# Patient Record
Sex: Female | Born: 1985 | Race: Black or African American | Hispanic: No | Marital: Married | Smoking: Never smoker
Health system: Southern US, Community
[De-identification: ages and names within clinical notes are randomized; demographics above are authoritative.]

## PROBLEM LIST (undated history)

## (undated) DIAGNOSIS — D649 Anemia, unspecified: Secondary | ICD-10-CM

## (undated) DIAGNOSIS — Z9289 Personal history of other medical treatment: Secondary | ICD-10-CM

## (undated) DIAGNOSIS — E669 Obesity, unspecified: Secondary | ICD-10-CM

## (undated) DIAGNOSIS — F329 Major depressive disorder, single episode, unspecified: Secondary | ICD-10-CM

## (undated) DIAGNOSIS — F32A Depression, unspecified: Secondary | ICD-10-CM

## (undated) DIAGNOSIS — I10 Essential (primary) hypertension: Secondary | ICD-10-CM

## (undated) DIAGNOSIS — R42 Dizziness and giddiness: Secondary | ICD-10-CM

## (undated) DIAGNOSIS — F419 Anxiety disorder, unspecified: Secondary | ICD-10-CM

## (undated) HISTORY — PX: TUBAL LIGATION: SHX77

## (undated) HISTORY — PX: NOVASURE ABLATION: SHX5394

---

## 2011-11-18 DIAGNOSIS — Z9289 Personal history of other medical treatment: Secondary | ICD-10-CM

## 2011-11-18 HISTORY — DX: Personal history of other medical treatment: Z92.89

## 2016-07-18 ENCOUNTER — Emergency Department (HOSPITAL_COMMUNITY)
Admission: EM | Admit: 2016-07-18 | Discharge: 2016-07-18 | Disposition: A | Discharge status: Home / Self Care | Payer: Self-pay | Attending: Emergency Medicine | Admitting: Emergency Medicine

## 2016-07-18 ENCOUNTER — Emergency Department (HOSPITAL_COMMUNITY): Payer: Self-pay

## 2016-07-18 ENCOUNTER — Encounter (HOSPITAL_COMMUNITY): Payer: Self-pay | Admitting: Vascular Surgery

## 2016-07-18 DIAGNOSIS — R0789 Other chest pain: Secondary | ICD-10-CM | POA: Insufficient documentation

## 2016-07-18 DIAGNOSIS — I10 Essential (primary) hypertension: Secondary | ICD-10-CM | POA: Insufficient documentation

## 2016-07-18 DIAGNOSIS — F419 Anxiety disorder, unspecified: Secondary | ICD-10-CM | POA: Insufficient documentation

## 2016-07-18 DIAGNOSIS — R079 Chest pain, unspecified: Secondary | ICD-10-CM

## 2016-07-18 HISTORY — DX: Essential (primary) hypertension: I10

## 2016-07-18 HISTORY — DX: Anxiety disorder, unspecified: F41.9

## 2016-07-18 LAB — CBC
HCT: 37.5 % (ref 36.0–46.0)
HEMOGLOBIN: 11.5 g/dL — AB (ref 12.0–15.0)
MCH: 24.5 pg — AB (ref 26.0–34.0)
MCHC: 30.7 g/dL (ref 30.0–36.0)
MCV: 80 fL (ref 78.0–100.0)
PLATELETS: 211 10*3/uL (ref 150–400)
RBC: 4.69 MIL/uL (ref 3.87–5.11)
RDW: 14.8 % (ref 11.5–15.5)
WBC: 5.2 10*3/uL (ref 4.0–10.5)

## 2016-07-18 LAB — BASIC METABOLIC PANEL
ANION GAP: 7 (ref 5–15)
BUN: 11 mg/dL (ref 6–20)
CALCIUM: 9.1 mg/dL (ref 8.9–10.3)
CO2: 24 mmol/L (ref 22–32)
CREATININE: 0.8 mg/dL (ref 0.44–1.00)
Chloride: 107 mmol/L (ref 101–111)
GFR calc Af Amer: >60 mL/min (ref >60)
GLUCOSE: 118 mg/dL — AB (ref 65–99)
Potassium: 3.9 mmol/L (ref 3.5–5.1)
Sodium: 138 mmol/L (ref 135–145)

## 2016-07-18 LAB — I-STAT TROPONIN, ED: TROPONIN I, POC: 0 ng/mL (ref 0.00–0.08)

## 2016-07-18 NOTE — ED Provider Notes (Signed)
[facility]-EMERGENCY DEPT Provider Note   CSN: [id number] Arrival date & time: 07/18/16  1649     History   Chief Complaint Chief Complaint  Patient presents with  . Chest Pain    HPI Diamond Cook is a 30 y.o. female.  The history is provided by the patient.  Chest Pain   This is a recurrent problem. The current episode started 6 to 12 hours ago. Episode frequency: intermittent. The problem has been resolved. Associated with: anxiety. Pain location: right upper chest. The pain is moderate. The quality of the pain is described as sharp. The pain does not radiate. Duration of episode(s) is 30 seconds. Pertinent negatives include no abdominal pain, no back pain, no cough, no fever, no headaches, no nausea, no palpitations, no shortness of breath and no vomiting.  Her past medical history is significant for hypertension.  Pertinent negatives for past medical history include no CAD, no COPD, no CHF, no diabetes, no DVT, no hyperlipidemia, no MI and no PE.  Her family medical history is significant for diabetes.  Pertinent negatives for family medical history include: no CAD and no heart disease.    Past Medical History:  Diagnosis Date  . Anxiety   . Hypertension     There are no active problems to display for this patient.   Past Surgical History:  Procedure Laterality Date  . CESAREAN SECTION    . TUBAL LIGATION      OB History    No data available       Home Medications    Prior to Admission medications   Medication Sig Start Date End Date Taking? Authorizing Provider  ferrous sulfate 325 (65 FE) MG EC tablet Take 325 mg by mouth daily with breakfast.   Yes Historical Provider, MD  FLUoxetine (PROZAC) 20 MG capsule Take 20 mg by mouth daily.   Yes Historical Provider, MD  meclizine (ANTIVERT) 25 MG tablet Take 25 mg by mouth every 8 (eight) hours as needed for dizziness.   Yes Historical Provider, MD  metoprolol (LOPRESSOR) 50 MG tablet Take 50 mg by mouth 2 (two) times  daily.   Yes Historical Provider, MD  norgestimate-ethinyl estradiol (ORTHO-CYCLEN,SPRINTEC,PREVIFEM) 0.25-35 MG-MCG tablet Take 1 tablet by mouth daily as needed (for heavy bleeding).   Yes Historical Provider, MD    Family History No family history on file.  Social History Social History  Substance Use Topics  . Smoking status: Never Smoker  . Smokeless tobacco: Never Used  . Alcohol use No     Allergies   Review of patient's allergies indicates not on file.   Review of Systems Review of Systems  Constitutional: Negative for chills, fatigue and fever.  HENT: Negative for congestion and sore throat.   Eyes: Negative for visual disturbance.  Respiratory: Negative for cough, chest tightness and shortness of breath.   Cardiovascular: Negative for chest pain and palpitations.  Gastrointestinal: Negative for abdominal pain, blood in stool, diarrhea, nausea and vomiting.  Genitourinary: Negative for decreased urine volume and difficulty urinating.  Musculoskeletal: Negative for back pain and neck stiffness.  Skin: Negative for rash.  Neurological: Negative for light-headedness and headaches.  Psychiatric/Behavioral: Negative for confusion.  All other systems reviewed and are negative.    Physical Exam Updated Vital Signs BP 113/76   Pulse 67   Temp 98.3 F (36.8 C) (Oral)   Resp 22   SpO2 100%   Physical Exam  Constitutional: She is oriented to person, place, and time. She appears  well-developed and well-nourished. No distress.  Obese  HENT:  Head: Normocephalic and atraumatic.  Nose: Nose normal.  Eyes: Conjunctivae and EOM are normal. Pupils are equal, round, and reactive to light. Right eye exhibits no discharge. Left eye exhibits no discharge. No scleral icterus.  Neck: Normal range of motion. Neck supple.  Cardiovascular: Normal rate and regular rhythm.  Exam reveals no gallop and no friction rub.   No murmur heard. Pulmonary/Chest: Effort normal and breath  sounds normal. No stridor. No respiratory distress. She has no rales.  Abdominal: Soft. She exhibits no distension. There is no tenderness.  Musculoskeletal: She exhibits no edema or tenderness.  Neurological: She is alert and oriented to person, place, and time.  Skin: Skin is warm and dry. No rash noted. She is not diaphoretic. No erythema.  Psychiatric: She has a normal mood and affect.  Vitals reviewed.    ED Treatments / Results  Labs (all labs ordered are listed, but only abnormal results are displayed) Labs Reviewed  BASIC METABOLIC PANEL - Abnormal; Notable for the following:       Result Value   Glucose, Bld 118 (*)    All other components within normal limits  CBC - Abnormal; Notable for the following:    Hemoglobin 11.5 (*)    MCH 24.5 (*)    All other components within normal limits  I-STAT TROPOININ, ED    EKG  EKG Interpretation  Date/Time:  Friday July 18 2016 16:58:10 EDT Ventricular Rate:  78 PR Interval:  166 QRS Duration: 88 QT Interval:  382 QTC Calculation: 435 R Axis:   73 Text Interpretation:  Normal sinus rhythm Normal ECG Confirmed by [facility] MD, Breck [place] ([id number]) on 07/18/2016 6:07:23 PM       Radiology Dg Chest 2 View  Result Date: 07/18/2016 CLINICAL DATA:  Chest pain, hypertension EXAM: CHEST  2 VIEW COMPARISON:  None FINDINGS: Normal heart size, mediastinal contours, and pulmonary vascularity. Lungs clear. No pleural effusion or pneumothorax. Bones unremarkable. IMPRESSION: Normal exam. Electronically Signed   By: Ulyses Southward M.D.   On: 07/18/2016 17:14    Procedures Procedures (including critical care time)  Medications Ordered in ED Medications - No data to display   Initial Impression / Assessment and Plan / ED Course  I have reviewed the triage vital signs and the nursing notes.  Pertinent labs & imaging results that were available during my care of the patient were reviewed by me and considered in my medical decision making (see  chart for details).  Clinical Course    Atypical chest pain which is associated with anxiety. Similar to prior episodes per patient. EKG: Normal sinus rhythm, intervals, axis. No evidence of acute ischemia, arrhythmias, or blocks. Chest x-ray without evidence suggestive of pneumonia, pneumothorax, pneumomediastinum.  No abnormal contour of the mediastinum to suggest dissection. No evidence of acute injuries. Triage chest pain workup was obtained which revealed negative troponin. Low suspicion for cardiac etiology. Other labs are reassuring. Patient is PERC negative. Presentation not classic for aortic dissection or esophageal perforation.  Patient is currently asymptomatic. No other physical complaints or exam findings noted.  Patient is safe for discharge with strict return precautions. Patient to establish care and follow-up with her primary care provider.  Final Clinical Impressions(s) / ED Diagnoses   Final diagnoses:  Chest pain, unspecified chest pain type  Anxiety   Disposition: Discharge  Condition: Good  I have discussed the results, Dx and Tx plan with the patient who expressed  understanding and agree(s) with the plan. Discharge instructions discussed at great length. The patient was given strict return precautions who verbalized understanding of the instructions. No further questions at time of discharge.    Current Discharge Medication List      [facility] [address] [place] [id number] [phone] Call  For help establishing care with a care provider      Nira ConnPedro Eduardo Yaelis Scharfenberg, MD 07/18/16 [phone]

## 2016-07-18 NOTE — ED Triage Notes (Signed)
Pt reports to the ED for eval of right sided CP that began while she was laying down this am. Denies any aggravating or relieving factors. Pain is intermittent in nature. Pt reports associated symptoms of nausea. Pt denies any OTC tx for the pain. Pt has hx of anxiety but cannot tell if this feels like an anxiety attack or not. Pt also here for toothache as well.

## 2016-08-01 ENCOUNTER — Inpatient Hospital Stay (HOSPITAL_COMMUNITY)
Admission: AD | Admit: 2016-08-01 | Discharge: 2016-08-01 | Disposition: A | Discharge status: Home / Self Care | Payer: Self-pay | Source: Ambulatory Visit | Attending: Obstetrics and Gynecology | Admitting: Obstetrics and Gynecology

## 2016-08-01 ENCOUNTER — Encounter (HOSPITAL_COMMUNITY): Payer: Self-pay | Admitting: Certified Nurse Midwife

## 2016-08-01 DIAGNOSIS — N920 Excessive and frequent menstruation with regular cycle: Secondary | ICD-10-CM | POA: Insufficient documentation

## 2016-08-01 DIAGNOSIS — R109 Unspecified abdominal pain: Secondary | ICD-10-CM | POA: Insufficient documentation

## 2016-08-01 DIAGNOSIS — Z9851 Tubal ligation status: Secondary | ICD-10-CM | POA: Insufficient documentation

## 2016-08-01 DIAGNOSIS — I10 Essential (primary) hypertension: Secondary | ICD-10-CM | POA: Insufficient documentation

## 2016-08-01 DIAGNOSIS — N946 Dysmenorrhea, unspecified: Secondary | ICD-10-CM

## 2016-08-01 DIAGNOSIS — N939 Abnormal uterine and vaginal bleeding, unspecified: Secondary | ICD-10-CM

## 2016-08-01 DIAGNOSIS — R110 Nausea: Secondary | ICD-10-CM | POA: Insufficient documentation

## 2016-08-01 DIAGNOSIS — F419 Anxiety disorder, unspecified: Secondary | ICD-10-CM | POA: Insufficient documentation

## 2016-08-01 LAB — URINALYSIS, ROUTINE W REFLEX MICROSCOPIC
Bilirubin Urine: NEGATIVE
Glucose, UA: NEGATIVE mg/dL
Ketones, ur: NEGATIVE mg/dL
LEUKOCYTES UA: NEGATIVE
NITRITE: NEGATIVE
PH: 5.5 (ref 5.0–8.0)
Protein, ur: NEGATIVE mg/dL
SPECIFIC GRAVITY, URINE: 1.025 (ref 1.005–1.030)

## 2016-08-01 LAB — CBC
HCT: 36.2 % (ref 36.0–46.0)
HEMOGLOBIN: 11.7 g/dL — AB (ref 12.0–15.0)
MCH: 24.9 pg — AB (ref 26.0–34.0)
MCHC: 32.3 g/dL (ref 30.0–36.0)
MCV: 77 fL — AB (ref 78.0–100.0)
Platelets: 214 10*3/uL (ref 150–400)
RBC: 4.7 MIL/uL (ref 3.87–5.11)
RDW: 15.2 % (ref 11.5–15.5)
WBC: 5.3 10*3/uL (ref 4.0–10.5)

## 2016-08-01 LAB — URINE MICROSCOPIC-ADD ON

## 2016-08-01 MED ORDER — PROMETHAZINE HCL 25 MG PO TABS: 12.5000 mg | ORAL_TABLET | Freq: Four times a day (QID) | ORAL | 0 refills | Status: DC | PRN

## 2016-08-01 MED ORDER — NORGESTIMATE-ETH ESTRADIOL 0.25-35 MG-MCG PO TABS: 1.0000 | ORAL_TABLET | Freq: Every day | ORAL | 11 refills | Status: DC

## 2016-08-01 MED ORDER — IBUPROFEN 800 MG PO TABS: 800.0000 mg | ORAL_TABLET | Freq: Three times a day (TID) | ORAL | 1 refills | Status: DC | PRN

## 2016-08-01 NOTE — MAU Note (Cosign Needed)
History     CSN: [id number]  Arrival date and time: 08/01/16 1055   None     Chief Complaint  Patient presents with  . Nausea  . Abdominal Pain   HPI  Diamond Cook is a 30 yo female who presents today with 3-4 days of intermittent nausea. The nausea began during an episode of "IBS" where she also experienced lower abdominal cramping. After defecation the lower abdominal cramping was relieved but the nausea persisted. She reports eating makes the nausea worse but she has been able to eat and drink appropriately. She has never had nausea like this before. She has had intermittent abdominal cramping for years which she has self diagnosed as IBS. She restarted OCPs this week for prolonged and heavy menses. Has never had nausea with menses before. She denies any current sick contacts, fever, chills, vomiting, diarrhea, constipation, paint/buring with urination, vaginal discharge.   Pertinent Gynecological History: Menses: uses 2 "overnight" pads every 3 hours, menstruation lasts 1-2 months, takes OCPs as needed to stop menstruation lasting longer than a week LMP: started last week, on 2nd week of bleeding now   Past Medical History:  Diagnosis Date  . Anxiety   . Hypertension     Past Surgical History:  Procedure Laterality Date  . CESAREAN SECTION    . TUBAL LIGATION      No family history on file.  Social History  Substance Use Topics  . Smoking status: Never Smoker  . Smokeless tobacco: Never Used  . Alcohol use No    Allergies: No Known Allergies  No prescriptions prior to admission.    Review of Systems  Constitutional: Negative for chills and fever.  HENT: Positive for congestion.   Respiratory: Negative for cough and shortness of breath.   Cardiovascular: Negative for palpitations.  Gastrointestinal: Negative for constipation and diarrhea.  Neurological: Negative for dizziness and headaches.   Physical Exam   Blood pressure 114/78, pulse 99, temperature 98.4  F (36.9 C), resp. rate 18, height 5\' 7"  (1.702 m), weight (!) 142 kg (313 lb), last menstrual period 08/01/2016.  Physical Exam  Constitutional: She appears well-developed and well-nourished. No distress.  Cardiovascular: Normal rate and regular rhythm.   Respiratory: Effort normal and breath sounds normal. No respiratory distress.  GI: Soft. Bowel sounds are normal. She exhibits no distension. There is no tenderness. There is no guarding.  Skin: Skin is warm and dry. No rash noted.    MAU Course  Procedures  Results for orders placed or performed during the hospital encounter of 08/01/16 (from the past 24 hour(s))  CBC     Status: Abnormal   Collection Time: 08/01/16 12:55 PM  Result Value Ref Range   WBC 5.3 4.0 - 10.5 K/uL   RBC 4.70 3.87 - 5.11 MIL/uL   Hemoglobin 11.7 (L) 12.0 - 15.0 g/dL   HCT 40.936.2 81.136.0 - 91.446.0 %   MCV 77.0 (L) 78.0 - 100.0 fL   MCH 24.9 (L) 26.0 - 34.0 pg   MCHC 32.3 30.0 - 36.0 g/dL   RDW 78.215.2 95.611.5 - 21.315.5 %   Platelets 214 150 - 400 K/uL  Urinalysis, Routine w reflex microscopic (not [facility])     Status: Abnormal   Collection Time: 08/01/16  1:25 PM  Result Value Ref Range   Color, Urine YELLOW YELLOW   APPearance HAZY (A) CLEAR   Specific Gravity, Urine 1.025 1.005 - 1.030   pH 5.5 5.0 - 8.0   Glucose, UA NEGATIVE  NEGATIVE mg/dL   Hgb urine dipstick LARGE (A) NEGATIVE   Bilirubin Urine NEGATIVE NEGATIVE   Ketones, ur NEGATIVE NEGATIVE mg/dL   Protein, ur NEGATIVE NEGATIVE mg/dL   Nitrite NEGATIVE NEGATIVE   Leukocytes, UA NEGATIVE NEGATIVE  Urine microscopic-add on     Status: Abnormal   Collection Time: 08/01/16  1:25 PM  Result Value Ref Range   Squamous Epithelial / LPF 0-5 (A) NONE SEEN   WBC, UA 0-5 0 - 5 WBC/hpf   RBC / HPF TOO NUMEROUS TO COUNT 0 - 5 RBC/hpf   Bacteria, UA FEW (A) NONE SEEN   Urine-Other MUCOUS PRESENT      Assessment and Plan  Diamond Cook is a 30 yo female with a history of menorrhagia who presents with  intermittent nausea.   Nausea -Discharge home -PO phenergan  -follow up with a PCP  Menorrhagia -Discharge home -Sprintec continuously  -Ibuprofen 800mg  PRN -[facility]  [facility] 08/01/2016, 2:22 PM

## 2016-08-01 NOTE — MAU Note (Signed)
Pt presents to MAU with complaints nausea and lower abdominal cramping for a couple of weeks. Pt states she is on her cycle now. Has had diarrhea but has been over 24 hours ago

## 2016-08-01 NOTE — Discharge Instructions (Signed)
[place] Resource Guide °(Revised August 2014) ° ° °Chronic Pain Problems:  °• [facility]:  [phone]  °         Patients need to be referred by their primary care doctor/specialist ° °Insufficient Money for Medicine:  °·         [employer]: call "211"   °• MAP Program at [facility] [place] [phone] or [place] [phone] °          ° °No Primary Care Doctor:  °To locate a primary care doctor that accepts your insurance or provides certain services:  °·         [place] [facility]: [phone]  °·         Physician Referral Service: 1-800-533-3463 ask for “My [place]” °• If no insurance, you need to see if you qualify for GCCN “orange card”, call to set      up appointment for eligibility/enrollment at [phone] or [phone] or visit [place] [facility] ([address], [place] and [address] -[place]) to meet with a GCCN enrollment specialist. ° °Agencies that provide inexpensive (sliding fee scale) medical care:  ° °•    [facility] [place] - [phone] °•    [facility]  -  [facility] - [phone] °•    [facility] - [phone] °•    [facility] - [phone] °•    [facility] - [phone] °•    [facility] - [phone] °• [facility] [place] - [phone]     [phone] °• [facility] [place] - [phone] °• [facility]: [phone]  °• [facility]: [phone]  °• [facility]: [phone]  °• [facility] - [phone]- [phone] ° ° ° °Medicaid-accepting [place] Providers:  °·         [facility] - [phone] (No Family Planning accepted) °·         [address], [phone], Mon-Fri 9am-5pm °·          [facility] - [phone] °• [address], [address], Mon-Thursday 8am-5pm, Fri 8am-noon °• [facility] - [phone] °·         [address], [address], Mon-Fri 7:30am-4:30pm °·         [facility] - [phone] °·         [address] [place] [address], Mon-Fri 8am-5pm  °·        [facility] - [phone] °·         [address] °·         Only accepts [place] Access Medicaid patients after they have their name applied to their card ° °Self Pay (no insurance) in [place]:  °         Sickle Cell Patients:  °• [address], [phone] °[facility]: °• [address], [place] [phone] ° °     [facility] °• [address], [place] [phone] ° °[facility]: °• [address], [phone] °           [facility]  °·         [address], [phone] °[facility] °• [address], [phone] °          [facility]  °·         [address], [place] [phone] °       [facility] - [address]  °·         [phone], Mon-Fri 9am-7pm, Sat 9am-1pm °         [facility] [place] °·         [address] [place] [address], [phone] °         [facility] [place]  °·         [address], [phone] °[facility] [place] °• [address], [place] [phone] °         [facility]  °·         [address] [place] [address], [phone] ° [facility]  °• [address], [phone] °[facility] °• [address], [phone] ° Dr. Osei-Bonsu  °·         [address], [place],  [phone] °         [facility]  °·         [address], [phone] °         [facility]  °·           [address], [phone] °         [facility]  °·         [address], [phone], 1st & 3rd Saturday every month, 10am-1pm ° °OTHERS:  [facility]  ([facility] Only)  [phone] (Thursday only) ° °Strategies for finding a Primary Care Provider:  °1) Find a Doctor and Pay Out of Pocket  °Although you won't have to find out who is covered by your insurance plan, it is a good idea to ask around and get recommendations. You will then need to call the office and see if the doctor you have chosen will accept you as a new patient and what types of options they offer for patients who are self-pay. Some doctors offer discounts or will set up payment plans for their patients who do not have insurance, but you will need to ask so you aren't surprised when you get to your appointment.  °2) Contact [facility] - To see if you qualify for “orange card” access to healthcare safety net providers.  Call for appointment for eligibility/enrollment at [phone] or [phone]. (Uninsured, 0-200% FPL, qualifying info)  °Applicants for GCCN are first required to see if they are eligible to enroll in the ACA Marketplace before enrolling in GCCN (and get an exemption if they are not).  °GCCN Criteria for acceptance is:  °? Proof of ACA Marketing exemption - form or documentation  °? Valid photo ID (driver's license, state identification card, passport, home country ID)  °? Proof of [place] residency (e.g. driver’s license, lease/landlord information, pay stubs with address, utility   bill, bank statement, etc.)  ? Proof of income (1040, last year's tax return, W2, 4 current pay stubs, other income proof)  ? Proof of assets (current bank statement + 3 most recent, disability paperwork, life insurance info, tax value on autos, etc.)  3)  Contact Your Local Health Department  Not all health departments have doctors that can see patients for sick visits, but many do, so it is worth a call to see if yours does. If you don't know where your local health department is, you can check in your phone book. The CDC also has a tool to help you locate your state's health department, and many state websites also have listings of all of their local health departments.  4) Find a Walk-in Clinic  If your illness is not likely to be very severe or complicated, you may want to try a walk in clinic. These are popping up all over the country in pharmacies, drugstores, and shopping centers. They're usually staffed by nurse practitioners or physician assistants that have been trained to treat common illnesses and complaints. They're usually fairly quick and inexpensive. However, if you have serious medical issues or chronic medical problems, these are probably not your best option   Abnormal Uterine Bleeding Abnormal uterine bleeding can affect women at various stages in life, including teenagers, women in their reproductive years, pregnant women, and women who have reached menopause. Several kinds of uterine bleeding are considered abnormal, including:  Bleeding or spotting between periods.   Bleeding after sexual intercourse.   Bleeding that is heavier or more than normal.   Periods that last longer than usual.  Bleeding after menopause.  Many cases of abnormal uterine bleeding are minor and simple to treat, while others are more serious. Any type of abnormal bleeding should be evaluated by your health care provider. Treatment will depend on the cause of the bleeding. HOME CARE INSTRUCTIONS Monitor your condition for any changes. The following actions may help to alleviate any discomfort you are experiencing:  Avoid the use of tampons and douches as directed by your health care provider.  Change your pads frequently. You should get regular  pelvic exams and Pap tests. Keep all follow-up appointments for diagnostic tests as directed by your health care provider.  SEEK MEDICAL CARE IF:   Your bleeding lasts more than 1 week.   You feel dizzy at times.  SEEK IMMEDIATE MEDICAL CARE IF:   You pass out.   You are changing pads every 15 to 30 minutes.   You have abdominal pain.  You have a fever.   You become sweaty or weak.   You are passing large blood clots from the vagina.   You start to feel nauseous and vomit. MAKE SURE YOU:   Understand these instructions.  Will watch your condition.  Will get help right away if you are not doing well or get worse.   This information is not intended to replace advice given to you by your health care provider. Make sure you discuss any questions you have with your health care provider.   Document Released: 11/03/2005 Document Revised: 11/08/2013 Document Reviewed: 06/02/2013 Elsevier Interactive Patient Education 2016 Elsevier Inc.  Nausea and Vomiting Nausea is a sick feeling that often comes before throwing up (vomiting). Vomiting is a reflex where stomach contents come out of your mouth. Vomiting can cause severe loss of body fluids (dehydration). Children and elderly adults can become dehydrated quickly, especially if they also have diarrhea. Nausea and  vomiting are symptoms of a condition or disease. It is important to find the cause of your symptoms. CAUSES   Direct irritation of the stomach lining. This irritation can result from increased acid production (gastroesophageal reflux disease), infection, food poisoning, taking certain medicines (such as nonsteroidal anti-inflammatory drugs), alcohol use, or tobacco use.  Signals from the brain.These signals could be caused by a headache, heat exposure, an inner ear disturbance, increased pressure in the brain from injury, infection, a tumor, or a concussion, pain, emotional stimulus, or metabolic problems.  An  obstruction in the gastrointestinal tract (bowel obstruction).  Illnesses such as diabetes, hepatitis, gallbladder problems, appendicitis, kidney problems, cancer, sepsis, atypical symptoms of a heart attack, or eating disorders.  Medical treatments such as chemotherapy and radiation.  Receiving medicine that makes you sleep (general anesthetic) during surgery. DIAGNOSIS Your caregiver may ask for tests to be done if the problems do not improve after a few days. Tests may also be done if symptoms are severe or if the reason for the nausea and vomiting is not clear. Tests may include:  Urine tests.  Blood tests.  Stool tests.  Cultures (to look for evidence of infection).  X-rays or other imaging studies. Test results can help your caregiver make decisions about treatment or the need for additional tests. TREATMENT You need to stay well hydrated. Drink frequently but in small amounts.You may wish to drink water, sports drinks, clear broth, or eat frozen ice pops or gelatin dessert to help stay hydrated.When you eat, eating slowly may help prevent nausea.There are also some antinausea medicines that may help prevent nausea. HOME CARE INSTRUCTIONS   Take all medicine as directed by your caregiver.  If you do not have an appetite, do not force yourself to eat. However, you must continue to drink fluids.  If you have an appetite, eat a normal diet unless your caregiver tells you differently.  Eat a variety of complex carbohydrates (rice, wheat, potatoes, bread), lean meats, yogurt, fruits, and vegetables.  Avoid high-fat foods because they are more difficult to digest.  Drink enough water and fluids to keep your urine clear or pale yellow.  If you are dehydrated, ask your caregiver for specific rehydration instructions. Signs of dehydration may include:  Severe thirst.  Dry lips and mouth.  Dizziness.  Dark urine.  Decreasing urine frequency and  amount.  Confusion.  Rapid breathing or pulse. SEEK IMMEDIATE MEDICAL CARE IF:   You have blood or brown flecks (like coffee grounds) in your vomit.  You have black or bloody stools.  You have a severe headache or stiff neck.  You are confused.  You have severe abdominal pain.  You have chest pain or trouble breathing.  You do not urinate at least once every 8 hours.  You develop cold or clammy skin.  You continue to vomit for longer than 24 to 48 hours.  You have a fever. MAKE SURE YOU:   Understand these instructions.  Will watch your condition.  Will get help right away if you are not doing well or get worse.   This information is not intended to replace advice given to you by your health care provider. Make sure you discuss any questions you have with your health care provider.   Document Released: 11/03/2005 Document Revised: 01/26/2012 Document Reviewed: 04/02/2011 Elsevier Interactive Patient Education 2016 Elsevier Inc.  Dysmenorrhea Menstrual cramps (dysmenorrhea) are caused by the muscles of the uterus tightening (contracting) during a menstrual period. For some women,  this discomfort is merely bothersome. For others, dysmenorrhea can be severe enough to interfere with everyday activities for a few days each month. Primary dysmenorrhea is menstrual cramps that last a couple of days when you start having menstrual periods or soon after. This often begins after a teenager starts having her period. As a woman gets older or has a baby, the cramps will usually lessen or disappear. Secondary dysmenorrhea begins later in life, lasts longer, and the pain may be stronger than primary dysmenorrhea. The pain may start before the period and last a few days after the period.  CAUSES  Dysmenorrhea is usually caused by an underlying problem, such as:  The tissue lining the uterus grows outside of the uterus in other areas of the body (endometriosis).  The endometrial  tissue, which normally lines the uterus, is found in or grows into the muscular walls of the uterus (adenomyosis).  The pelvic blood vessels are engorged with blood just before the menstrual period (pelvic congestive syndrome).  Overgrowth of cells (polyps) in the lining of the uterus or cervix.  Falling down of the uterus (prolapse) because of loose or stretched ligaments.  Depression.  Bladder problems, infection, or inflammation.  Problems with the intestine, a tumor, or irritable bowel syndrome.  Cancer of the female organs or bladder.  A severely tipped uterus.  A very tight opening or closed cervix.  Noncancerous tumors of the uterus (fibroids).  Pelvic inflammatory disease (PID).  Pelvic scarring (adhesions) from a previous surgery.  Ovarian cyst.  An intrauterine device (IUD) used for birth control. RISK FACTORS You may be at greater risk of dysmenorrhea if:  You are younger than age 31.  You started puberty early.  You have irregular or heavy bleeding.  You have never given birth.  You have a family history of this problem.  You are a smoker. SIGNS AND SYMPTOMS   Cramping or throbbing pain in your lower abdomen.  Headaches.  Lower back pain.  Nausea or vomiting.  Diarrhea.  Sweating or dizziness.  Loose stools. DIAGNOSIS  A diagnosis is based on your history, symptoms, physical exam, diagnostic tests, or procedures. Diagnostic tests or procedures may include:  Blood tests.  Ultrasonography.  An examination of the lining of the uterus (dilation and curettage, D&C).  An examination inside your abdomen or pelvis with a scope (laparoscopy).  X-rays.  CT scan.  MRI.  An examination inside the bladder with a scope (cystoscopy).  An examination inside the intestine or stomach with a scope (colonoscopy, gastroscopy). TREATMENT  Treatment depends on the cause of the dysmenorrhea. Treatment may include:  Pain medicine prescribed by your  health care provider.  Birth control pills or an IUD with progesterone hormone in it.  Hormone replacement therapy.  Nonsteroidal anti-inflammatory drugs (NSAIDs). These may help stop the production of prostaglandins.  Surgery to remove adhesions, endometriosis, ovarian cyst, or fibroids.  Removal of the uterus (hysterectomy).  Progesterone shots to stop the menstrual period.  Cutting the nerves on the sacrum that go to the female organs (presacral neurectomy).  Electric current to the sacral nerves (sacral nerve stimulation).  Antidepressant medicine.  Psychiatric therapy, counseling, or group therapy.  Exercise and physical therapy.  Meditation and yoga therapy.  Acupuncture. HOME CARE INSTRUCTIONS   Only take over-the-counter or prescription medicines as directed by your health care provider.  Place a heating pad or hot water bottle on your lower back or abdomen. Do not sleep with the heating pad.  Use aerobic  exercises, walking, swimming, biking, and other exercises to help lessen the cramping.  Massage to the lower back or abdomen may help.  Stop smoking.  Avoid alcohol and caffeine. SEEK MEDICAL CARE IF:   Your pain does not get better with medicine.  You have pain with sexual intercourse.  Your pain increases and is not controlled with medicines.  You have abnormal vaginal bleeding with your period.  You develop nausea or vomiting with your period that is not controlled with medicine. SEEK IMMEDIATE MEDICAL CARE IF:  You pass out.    This information is not intended to replace advice given to you by your health care provider. Make sure you discuss any questions you have with your health care provider.   Document Released: 11/03/2005 Document Revised: 07/06/2013 Document Reviewed: 04/21/2013 Elsevier Interactive Patient Education [employer]

## 2016-08-01 NOTE — MAU Provider Note (Signed)
Chief Complaint: Nausea and Abdominal Pain   First Provider Initiated Contact with Patient 08/01/16 1306      SUBJECTIVE HPI: Diamond Cook is a 30 y.o.  who presents to maternity admissions reporting nausea x 3 days without vomiting and abdominal pain.  She reports acute onset of nausea 3 days ago.  Eating makes it worse but she has tolerated food/fluids well.  She reports 1 episode of diarrhea yesterday but none since.  She believes she has IBS but has never seen anyone for this.  She denies abdominal pain today but reports she has pain associated with her heavy periods.  She recently moved to [facility] from [facility] and last saw a Gyn provider a few months ago for heavy periods and abdominal cramping.  She was prescribed OCPs to take when bleeding lasts >10 days and ibuprofen 800 mg.  She reports she has no pain when on OCPs but when she stops them, the bleeding and pain start.  She started her period 2 weeks ago and started the OCPs 3-4 days ago so her pain stopped.  But, she is concerned that the pain will return when she stops the OCPs.  She reports light vaginal bleeding/spotting today, as the bleeding has improved on OCPs. She vaginal itching/burning, urinary symptoms, h/a, dizziness, n/v, or fever/chills.     HPI  Past Medical History:  Diagnosis Date  . Anxiety   . Hypertension    Past Surgical History:  Procedure Laterality Date  . CESAREAN SECTION    . TUBAL LIGATION     Social History   Social History  . Marital status: Married    Spouse name: N/A  . Number of children: N/A  . Years of education: N/A   Occupational History  . Not on file.   Social History Main Topics  . Smoking status: Never Smoker  . Smokeless tobacco: Never Used  . Alcohol use No  . Drug use: No  . Sexual activity: Not on file   Other Topics Concern  . Not on file   Social History Narrative  . No narrative on file   No current facility-administered medications on file prior to encounter.     Current Outpatient Prescriptions on File Prior to Encounter  Medication Sig Dispense Refill  . ferrous sulfate 325 (65 FE) MG EC tablet Take 325 mg by mouth daily as needed (when feeling sluggish).     . metoprolol (LOPRESSOR) 50 MG tablet Take 50 mg by mouth 2 (two) times daily.    . norgestimate-ethinyl estradiol (ORTHO-CYCLEN,SPRINTEC,PREVIFEM) 0.25-35 MG-MCG tablet Take 1 tablet by mouth daily as needed (for heavy bleeding).    . meclizine (ANTIVERT) 25 MG tablet Take 25 mg by mouth every 8 (eight) hours as needed for dizziness.     No Known Allergies  ROS:  Review of Systems  Constitutional: Negative for chills, fatigue and fever.  Respiratory: Negative for shortness of breath.   Cardiovascular: Negative for chest pain.  Gastrointestinal: Positive for abdominal pain, diarrhea and nausea. Negative for vomiting.  Genitourinary: Positive for vaginal bleeding. Negative for difficulty urinating, dysuria, flank pain, pelvic pain, vaginal discharge and vaginal pain.  Neurological: Negative for dizziness and headaches.  Psychiatric/Behavioral: Negative.      I have reviewed patient's Past Medical Hx, Surgical Hx, Family Hx, Social Hx, medications and allergies.   Physical Exam  Patient Vitals for the past 24 hrs:  BP Temp Pulse Resp Weight  08/01/16 1117 - - - - (!) 313 lb (142  kg)  08/01/16 1116 114/78 98.4 F (36.9 C) 99 18 -   Constitutional: Well-developed, well-nourished female in no acute distress.  Cardiovascular: normal rate Respiratory: normal effort GI: Abd soft, non-tender. Pos BS x 4 MS: Extremities nontender, no edema, normal ROM Neurologic: Alert and oriented x 4.  GU: Neg CVAT.  PELVIC EXAM: Deferred   LAB RESULTS Results for orders placed or performed during the hospital encounter of 08/01/16 (from the past 24 hour(s))  CBC     Status: Abnormal   Collection Time: 08/01/16 12:55 PM  Result Value Ref Range   WBC 5.3 4.0 - 10.5 K/uL   RBC 4.70 3.87 - 5.11  MIL/uL   Hemoglobin 11.7 (L) 12.0 - 15.0 g/dL   HCT 16.136.2 09.636.0 - 04.546.0 %   MCV 77.0 (L) 78.0 - 100.0 fL   MCH 24.9 (L) 26.0 - 34.0 pg   MCHC 32.3 30.0 - 36.0 g/dL   RDW 40.915.2 81.111.5 - 91.415.5 %   Platelets 214 150 - 400 K/uL       IMAGING  MAU Management/MDM: Ordered labs and reviewed results.  No evidence of infection or anemia.  Recommend continuous OCPs, reviewed pt hx and only risk factor is obesity.  Sprintec Rx sent to pharmacy.  Rx for ibuprofen 800 mg PO Q 8 hours.  Phenergan 12.5-25 mg PO Q 6 hours for nausea.  F/U in Gyn office, pt given contact information.  F/U with primary care if GI symtoms persist.  Pt stable at time of discharge.  ASSESSMENT 1. Nausea without vomiting   2. Dysmenorrhea   3. Abnormal uterine bleeding (AUB)     PLAN Discharge home   Medication List    TAKE these medications   ferrous sulfate 325 (65 FE) MG EC tablet Take 325 mg by mouth daily as needed (when feeling sluggish).   ibuprofen 800 MG tablet Commonly known as:  ADVIL,MOTRIN Take 1 tablet (800 mg total) by mouth every 8 (eight) hours as needed.   meclizine 25 MG tablet Commonly known as:  ANTIVERT Take 25 mg by mouth every 8 (eight) hours as needed for dizziness.   metoprolol 50 MG tablet Commonly known as:  LOPRESSOR Take 50 mg by mouth 2 (two) times daily.   norgestimate-ethinyl estradiol 0.25-35 MG-MCG tablet Commonly known as:  ORTHO-CYCLEN,SPRINTEC,PREVIFEM Take 1 tablet by mouth daily. What changed:  when to take this  reasons to take this   promethazine 25 MG tablet Commonly known as:  PHENERGAN Take 0.5-1 tablets (12.5-25 mg total) by mouth every 6 (six) hours as needed for nausea.        Sharen CounterLisa Leftwich-Kirby Certified Nurse-Midwife 08/01/2016  1:13 PM

## 2016-08-04 ENCOUNTER — Encounter (HOSPITAL_COMMUNITY): Payer: Self-pay | Admitting: Vascular Surgery

## 2016-08-04 ENCOUNTER — Emergency Department (HOSPITAL_COMMUNITY)
Admission: EM | Admit: 2016-08-04 | Discharge: 2016-08-04 | Disposition: A | Discharge status: Home / Self Care | Payer: Self-pay | Attending: Dermatology | Admitting: Dermatology

## 2016-08-04 DIAGNOSIS — R51 Headache: Secondary | ICD-10-CM | POA: Insufficient documentation

## 2016-08-04 DIAGNOSIS — I10 Essential (primary) hypertension: Secondary | ICD-10-CM | POA: Insufficient documentation

## 2016-08-04 DIAGNOSIS — Z5321 Procedure and treatment not carried out due to patient leaving prior to being seen by health care provider: Secondary | ICD-10-CM | POA: Insufficient documentation

## 2016-08-04 NOTE — ED Triage Notes (Signed)
Pt reports to the ED for eval of HA since last pm (estimates 2 am onset). She describes the pain as a sharpness. Denies any aggravating or relieving factors. Denies any weakness, trouble walking, talking, swallowing, or vision changes. On triage assessment no facial droop or arm drift. Grip equal also reports a toothache to the left upper jaw.

## 2016-08-04 NOTE — ED Notes (Signed)
Pt called x 3 for reassess and unable to locate

## 2016-08-20 ENCOUNTER — Encounter (HOSPITAL_COMMUNITY): Payer: Self-pay | Admitting: *Deleted

## 2016-08-20 ENCOUNTER — Emergency Department (HOSPITAL_COMMUNITY)
Admission: EM | Admit: 2016-08-20 | Discharge: 2016-08-20 | Disposition: A | Discharge status: Home / Self Care | Payer: Medicaid Other | Attending: Emergency Medicine | Admitting: Emergency Medicine

## 2016-08-20 ENCOUNTER — Emergency Department (HOSPITAL_COMMUNITY): Payer: Medicaid Other

## 2016-08-20 DIAGNOSIS — N938 Other specified abnormal uterine and vaginal bleeding: Secondary | ICD-10-CM

## 2016-08-20 DIAGNOSIS — R1030 Lower abdominal pain, unspecified: Secondary | ICD-10-CM

## 2016-08-20 DIAGNOSIS — I10 Essential (primary) hypertension: Secondary | ICD-10-CM | POA: Insufficient documentation

## 2016-08-20 DIAGNOSIS — R102 Pelvic and perineal pain: Secondary | ICD-10-CM

## 2016-08-20 DIAGNOSIS — R52 Pain, unspecified: Secondary | ICD-10-CM

## 2016-08-20 LAB — URINALYSIS, ROUTINE W REFLEX MICROSCOPIC
Bilirubin Urine: NEGATIVE
Glucose, UA: NEGATIVE mg/dL
Ketones, ur: NEGATIVE mg/dL
Leukocytes, UA: NEGATIVE
Nitrite: NEGATIVE
Protein, ur: NEGATIVE mg/dL
Specific Gravity, Urine: 1.029 (ref 1.005–1.030)
pH: 5.5 (ref 5.0–8.0)

## 2016-08-20 LAB — PREGNANCY, URINE: Preg Test, Ur: NEGATIVE

## 2016-08-20 LAB — URINE MICROSCOPIC-ADD ON

## 2016-08-20 MED ORDER — KETOROLAC TROMETHAMINE 30 MG/ML IJ SOLN
30.0000 mg | Freq: Once | INTRAMUSCULAR | Status: AC
  Administered 2016-08-20: 30 mg via INTRAMUSCULAR
  Filled 2016-08-20: qty 1

## 2016-08-20 MED ORDER — LORAZEPAM 1 MG PO TABS
0.5000 mg | ORAL_TABLET | Freq: Once | ORAL | Status: DC
  Filled 2016-08-20: qty 1

## 2016-08-20 MED ORDER — OXYCODONE-ACETAMINOPHEN 5-325 MG PO TABS
1.0000 | ORAL_TABLET | Freq: Once | ORAL | Status: AC
  Administered 2016-08-20: 1 via ORAL
  Filled 2016-08-20: qty 1

## 2016-08-20 NOTE — ED Notes (Signed)
Patient transported to Ultrasound 

## 2016-08-20 NOTE — ED Notes (Signed)
Pt refused d/c vitals.

## 2016-08-20 NOTE — ED Triage Notes (Signed)
Pt reports lower abd pain x 2 weeks and has been on menstrual x 2 months. Pt reports this being uterine pain. Denies urinary symptoms.

## 2016-08-20 NOTE — ED Notes (Signed)
Pt returned from ultrasound

## 2016-08-20 NOTE — ED Provider Notes (Signed)
[facility]-EMER[facility]Y DEPT Provider Note   CSN: [id number] Arrival date & time: 08/20/16  4098  By signing my name below, I, Freida Busman, attest that this documentation has been prepared under the direction and in the presence of Raeford Razor, MD . Electronically Signed: Freida Busman, Scribe. 08/20/2016. 9:23 AM.   History   Chief Complaint Chief Complaint  Patient presents with  . Abdominal Pain    The history is provided by the patient. No language interpreter was used.     HPI Comments:  Diamond Cook is a 30 y.o. female who presents to the Emergency Department complaining of constant suprapubic/pelvic pain x 1.5 months. She notes she was taking Ibuprofen which recently stopped providing relief. She describes her pain as a cramping sensation and states "my uterus hurts".  She has a h/o similar pain but has been unable to follow up with GYN due to insurance issues. Pt reports associated vaginal bleeding x 2 months; she has been using ~ 6 pads per day. She also notes nausea. Pt is compliant with her oral contraceptive pills but states it has not provided relief of her bleeding. She denies fever, chills, abnormal vaginal discharge, vomiting, and bowel/bladder changes. Pt is sexually active and has a h/o tubal ligation.  Past Medical History:  Diagnosis Date  . Anxiety   . Hypertension     There are no active problems to display for this patient.   Past Surgical History:  Procedure Laterality Date  . CESAREAN SECTION    . TUBAL LIGATION      OB History    Gravida Para Term Preterm AB Living   3 3 3     3    SAB TAB Ectopic Multiple Live Births           3       Home Medications    Prior to Admission medications   Medication Sig Start Date End Date Taking? Authorizing Provider  ferrous sulfate 325 (65 FE) MG EC tablet Take 325 mg by mouth daily as needed (when feeling sluggish).    Yes Historical Provider, MD  ibuprofen (ADVIL,MOTRIN) 800 MG tablet Take 1 tablet (800 mg  total) by mouth every 8 (eight) hours as needed. Patient taking differently: Take 800 mg by mouth every 8 (eight) hours as needed for headache or moderate pain.  08/01/16  Yes Lisa A Leftwich-Kirby, CNM  metoprolol (LOPRESSOR) 50 MG tablet Take 50 mg by mouth 2 (two) times daily.   Yes Historical Provider, MD  norgestimate-ethinyl estradiol (ORTHO-CYCLEN,SPRINTEC,PREVIFEM) 0.25-35 MG-MCG tablet Take 1 tablet by mouth daily. 08/01/16  Yes Lisa A Leftwich-Kirby, CNM  meclizine (ANTIVERT) 25 MG tablet Take 25 mg by mouth every 8 (eight) hours as needed for dizziness.    Historical Provider, MD  promethazine (PHENERGAN) 25 MG tablet Take 0.5-1 tablets (12.5-25 mg total) by mouth every 6 (six) hours as needed for nausea. 08/01/16   Hurshel Party, CNM    Family History History reviewed. No pertinent family history.  Social History Social History  Substance Use Topics  . Smoking status: Never Smoker  . Smokeless tobacco: Never Used  . Alcohol use No     Allergies   Review of patient's allergies indicates no known allergies.   Review of Systems Review of Systems  Constitutional: Negative for chills and fever.  Respiratory: Negative for shortness of breath.   Cardiovascular: Negative for chest pain.  Gastrointestinal: Positive for abdominal pain and nausea. Negative for constipation, diarrhea and vomiting.  Genitourinary:  Positive for vaginal bleeding. Negative for dysuria, frequency, urgency and vaginal discharge.  All other systems reviewed and are negative.  Physical Exam Updated Vital Signs BP 115/65   Pulse 70   Temp 97.9 F (36.6 C) (Oral)   Resp 18   Ht 5\' 7"  (1.702 m)   Wt (!) 305 lb (138.3 kg)   LMP 08/01/2016   SpO2 100%   BMI 47.77 kg/m   Physical Exam  Constitutional: She is oriented to person, place, and time. She appears well-developed and well-nourished. No distress.  HENT:  Head: Normocephalic and atraumatic.  Eyes: EOM are normal.  Neck: Normal range  of motion.  Cardiovascular: Normal rate, regular rhythm and normal heart sounds.   Pulmonary/Chest: Effort normal and breath sounds normal.  Abdominal: Soft. She exhibits no distension. There is tenderness.  Suprapubic  Musculoskeletal: Normal range of motion.  Neurological: She is alert and oriented to person, place, and time.  Skin: Skin is warm and dry.  Psychiatric: She has a normal mood and affect. Judgment normal.  Nursing note and vitals reviewed.  ED Treatments / Results  DIAGNOSTIC STUDIES:  Oxygen Saturation is 100% on RA, normal by my interpretation.    COORDINATION OF CARE:  9:09 AM Discussed treatment plan with pt at bedside and pt agreed to plan.  Labs (all labs ordered are listed, but only abnormal results are displayed) Labs Reviewed  URINE CULTURE - Abnormal; Notable for the following:       Result Value   Culture MULTIPLE SPECIES PRESENT, SUGGEST RECOLLECTION (*)    All other components within normal limits  URINALYSIS, ROUTINE W REFLEX MICROSCOPIC (NOT AT [facility]) - Abnormal; Notable for the following:    APPearance HAZY (*)    Hgb urine dipstick LARGE (*)    All other components within normal limits  URINE MICROSCOPIC-ADD ON - Abnormal; Notable for the following:    Squamous Epithelial / LPF 0-5 (*)    Bacteria, UA FEW (*)    All other components within normal limits  PREGNANCY, URINE    EKG  EKG Interpretation None       Radiology No results found.  [place] Pelvis Complete  Result Date: 08/20/2016 CLINICAL DATA:  Six week history of pelvic pain EXAM: TRANSABDOMINAL ULTRASOUND OF PELVIS TECHNIQUE: Transabdominal ultrasound examination of the pelvis was performed in longitudinal and transverse projections. Patient refused transvaginal study. COMPARISON:  None. FINDINGS: Uterus Measurements: 8.4 x 6.5 x 6.5 cm. No fibroids or other mass visualized. Endometrium Thickness: 4 mm.  No focal abnormality visualized. Right ovary Unable to visualize by  transabdominal technique. No pelvic mass on the right evident on this study. Left ovary Unable to visualize by transabdominal technique. No pelvic mass on the left evident on this study. Other findings:  No abnormal free fluid. IMPRESSION: Unable to visualize either ovary by transabdominal technique. Patient refused transvaginal study. No pelvic mass or inflammatory focus is evident on this study. No free pelvic fluid. Electronically Signed   By: Bretta BangWilliam  Woodruff III M.D.   On: 08/20/2016 11:26    Procedures Procedures (including critical care time)  Medications Ordered in ED Medications  ketorolac (TORADOL) 30 MG/ML injection 30 mg (30 mg Intramuscular Given 08/20/16 0942)  oxyCODONE-acetaminophen (PERCOCET/ROXICET) 5-325 MG per tablet 1 tablet (1 tablet Oral Given 08/20/16 0942)     Initial Impression / Assessment and Plan / ED Course  I have reviewed the triage vital signs and the nursing notes.  Pertinent labs & imaging results that were  available during my care of the patient were reviewed by me and considered in my medical decision making (see chart for details).  Clinical Course    30yF with pelvic pain. She is refusing both pelvic exams and transvaginal US. This limits her evaluation and this was discussed with her. She is generally unhappy/frustrated with her symptoms. Understandable, but I doubt emergent process. It has been determined that no acute conditions requiring further emergency intervention are present at this time. The patient has been advised of the diagnosis and plan. I reviewed any labs and imaging including any potential incidental findings. We have discussed signs and symptoms that warrant return to the ED and they are listed in the discharge instructions.    Final Clinical Impressions(s) / ED Diagnoses   Final diagnoses:  Pain  DUB (dysfunctional uterine bleeding)  Pelvic pain in female    New Prescriptions New Prescriptions   No medications on file   I  personally preformed the services scribed in my presence. The recorded information has been reviewed is accurate. Raeford Razor, MD.     Raeford Razor, MD 08/24/16 1058

## 2016-08-21 LAB — URINE CULTURE

## 2016-09-06 ENCOUNTER — Encounter (HOSPITAL_COMMUNITY): Payer: Self-pay | Admitting: *Deleted

## 2016-09-06 ENCOUNTER — Emergency Department (HOSPITAL_COMMUNITY)
Admission: EM | Admit: 2016-09-06 | Discharge: 2016-09-06 | Disposition: A | Discharge status: Home / Self Care | Payer: Medicaid Other | Attending: Emergency Medicine | Admitting: Emergency Medicine

## 2016-09-06 DIAGNOSIS — R0981 Nasal congestion: Secondary | ICD-10-CM | POA: Diagnosis present

## 2016-09-06 DIAGNOSIS — I10 Essential (primary) hypertension: Secondary | ICD-10-CM | POA: Diagnosis not present

## 2016-09-06 DIAGNOSIS — Z79899 Other long term (current) drug therapy: Secondary | ICD-10-CM | POA: Diagnosis not present

## 2016-09-06 DIAGNOSIS — J029 Acute pharyngitis, unspecified: Secondary | ICD-10-CM | POA: Insufficient documentation

## 2016-09-06 HISTORY — DX: Dizziness and giddiness: R42

## 2016-09-06 HISTORY — DX: Obesity, unspecified: E66.9

## 2016-09-06 HISTORY — DX: Anemia, unspecified: D64.9

## 2016-09-06 LAB — RAPID STREP SCREEN (MED CTR MEBANE ONLY): Streptococcus, Group A Screen (Direct): NEGATIVE

## 2016-09-06 MED ORDER — CETIRIZINE HCL 10 MG PO TABS: 10.0000 mg | ORAL_TABLET | Freq: Every day | ORAL | 1 refills | Status: DC

## 2016-09-06 MED ORDER — AMOXICILLIN-POT CLAVULANATE 875-125 MG PO TABS: 1.0000 | ORAL_TABLET | Freq: Two times a day (BID) | ORAL | 0 refills | Status: DC

## 2016-09-06 NOTE — ED Notes (Signed)
Declined W/C at D/C and was escorted to lobby by RN. 

## 2016-09-06 NOTE — ED Provider Notes (Signed)
[facility] Provider Note   CSN: [id number] Arrival date & time: 09/06/16  1431     History   Chief Complaint Chief Complaint  Patient presents with  . Sore Throat  . Headache    HPI Diamond Cook is a 30 y.o. female.  Patient presents to the ED with a chief complaint of cold symptoms.  She states that she has had severe sinus congestion causing sinus headache, sore throat with swollen tonsils, and mild cough for the past 3 days.  There are no other associated symptoms.  There are no modifying factors.  She has not taken anything for her symptoms.    The history is provided by the patient. No language interpreter was used.    Past Medical History:  Diagnosis Date  . Anemia   . Anxiety   . Hypertension   . Obesity   . Vertigo     There are no active problems to display for this patient.   Past Surgical History:  Procedure Laterality Date  . CESAREAN SECTION    . TUBAL LIGATION      OB History    Gravida Para Term Preterm AB Living   3 3 3     3    SAB TAB Ectopic Multiple Live Births           3       Home Medications    Prior to Admission medications   Medication Sig Start Date End Date Taking? Authorizing Provider  amoxicillin-clavulanate (AUGMENTIN) 875-125 MG tablet Take 1 tablet by mouth every 12 (twelve) hours. 09/06/16   Roxy Horseman, PA-C  cetirizine (ZYRTEC ALLERGY) 10 MG tablet Take 1 tablet (10 mg total) by mouth daily. 09/06/16   Roxy Horseman, PA-C  ferrous sulfate 325 (65 FE) MG EC tablet Take 325 mg by mouth daily as needed (when feeling sluggish).     Historical Provider, MD  ibuprofen (ADVIL,MOTRIN) 800 MG tablet Take 1 tablet (800 mg total) by mouth every 8 (eight) hours as needed. Patient taking differently: Take 800 mg by mouth every 8 (eight) hours as needed for headache or moderate pain.  08/01/16   Wilmer Floor Leftwich-Kirby, CNM  meclizine (ANTIVERT) 25 MG tablet Take 25 mg by mouth every 8 (eight) hours as needed for dizziness.     Historical Provider, MD  metoprolol (LOPRESSOR) 50 MG tablet Take 50 mg by mouth 2 (two) times daily.    Historical Provider, MD  norgestimate-ethinyl estradiol (ORTHO-CYCLEN,SPRINTEC,PREVIFEM) 0.25-35 MG-MCG tablet Take 1 tablet by mouth daily. 08/01/16   Misty Stanley A Leftwich-Kirby, CNM  promethazine (PHENERGAN) 25 MG tablet Take 0.5-1 tablets (12.5-25 mg total) by mouth every 6 (six) hours as needed for nausea. 08/01/16   Hurshel Party, CNM    Family History History reviewed. No pertinent family history.  Social History Social History  Substance Use Topics  . Smoking status: Never Smoker  . Smokeless tobacco: Never Used  . Alcohol use No     Allergies   Review of patient's allergies indicates no known allergies.   Review of Systems Review of Systems  Constitutional: Positive for chills. Negative for fever.  HENT: Positive for postnasal drip, rhinorrhea, sinus pressure, sneezing and sore throat.   Respiratory: Positive for cough. Negative for shortness of breath.   Cardiovascular: Negative for chest pain.  Gastrointestinal: Negative for abdominal pain, constipation, diarrhea, nausea and vomiting.  Genitourinary: Negative for dysuria.     Physical Exam Updated Vital Signs BP 117/67 (BP Location: Left Arm)  Pulse 73   Temp 98.3 F (36.8 C) (Oral)   Resp 22   LMP 08/25/2016   SpO2 98%   Physical Exam Physical Exam  Constitutional: Pt  is oriented to person, place, and time. Appears well-developed and well-nourished. No distress.  HENT:  Head: Normocephalic and atraumatic.  Right Ear: Tympanic membrane, external ear and ear canal normal.  Left Ear: Tympanic membrane, external ear and ear canal normal.  Nose: Mucosal edema and moderate rhinorrhea present. No epistaxis. Right sinus exhibits no maxillary sinus tenderness and no frontal sinus tenderness. Left sinus exhibits no maxillary sinus tenderness and no frontal sinus tenderness.  Mouth/Throat: Uvula is midline  and mucous membranes are normal. Mucous membranes are not pale and not cyanotic. Oropharynx is erythematous with tonsillar edema and exudate, but no abscess Eyes: Conjunctivae are normal. Pupils are equal, round, and reactive to light.  Neck: Normal range of motion and full passive range of motion without pain.  Cardiovascular: Normal rate and intact distal pulses.   Pulmonary/Chest: Effort normal and breath sounds normal. No stridor.  Clear and equal breath sounds without focal wheezes, rhonchi, rales  Abdominal: Soft. Bowel sounds are normal. There is no tenderness.  Musculoskeletal: Normal range of motion.  Lymphadenopathy:    Pthas no cervical adenopathy.  Neurological: Pt is alert and oriented to person, place, and time.  Skin: Skin is warm and dry. No rash noted. Pt is not diaphoretic.  Psychiatric: Normal mood and affect.  Nursing note and vitals reviewed.    ED Treatments / Results  Labs (all labs ordered are listed, but only abnormal results are displayed) Labs Reviewed  RAPID STREP SCREEN (NOT AT [facility])  CULTURE, GROUP A STREP [facility])    EKG  EKG Interpretation None       Radiology No results found.  Procedures Procedures (including critical care time)  Medications Ordered in ED Medications - No data to display   Initial Impression / Assessment and Plan / ED Course  I have reviewed the triage vital signs and the nursing notes.  Pertinent labs & imaging results that were available during my care of the patient were reviewed by me and considered in my medical decision making (see chart for details).  Clinical Course    Symptoms consistent with sinus infection. VSS.  Final Clinical Impressions(s) / ED Diagnoses   Final diagnoses:  Sore throat  Sinus congestion    New Prescriptions Current Discharge Medication List    START taking these medications   Details  amoxicillin-clavulanate (AUGMENTIN) 875-125 MG tablet Take 1 tablet by mouth every 12  (twelve) hours. Qty: 14 tablet, Refills: 0    cetirizine (ZYRTEC ALLERGY) 10 MG tablet Take 1 tablet (10 mg total) by mouth daily. Qty: 30 tablet, Refills: 1         Roxy HorsemanRobert Sotirios Navarro, PA-C 09/06/16 1627    Loren Raceravid Yelverton, MD 09/12/16 [phone]

## 2016-09-06 NOTE — ED Triage Notes (Signed)
Pt reports having a migraine, has sore throat, neck pain and congestion. Denies fever.

## 2016-09-09 LAB — CULTURE, GROUP A STREP (THRC)

## 2016-09-23 ENCOUNTER — Emergency Department (HOSPITAL_COMMUNITY)
Admission: EM | Admit: 2016-09-23 | Discharge: 2016-09-23 | Disposition: A | Discharge status: Home / Self Care | Payer: Medicaid Other | Attending: Emergency Medicine | Admitting: Emergency Medicine

## 2016-09-23 ENCOUNTER — Encounter (HOSPITAL_COMMUNITY): Payer: Self-pay | Admitting: Emergency Medicine

## 2016-09-23 ENCOUNTER — Emergency Department (HOSPITAL_COMMUNITY): Payer: Medicaid Other

## 2016-09-23 DIAGNOSIS — I10 Essential (primary) hypertension: Secondary | ICD-10-CM | POA: Insufficient documentation

## 2016-09-23 DIAGNOSIS — R079 Chest pain, unspecified: Secondary | ICD-10-CM | POA: Diagnosis present

## 2016-09-23 LAB — CBC
HCT: 34.9 % — ABNORMAL LOW (ref 36.0–46.0)
Hemoglobin: 11.3 g/dL — ABNORMAL LOW (ref 12.0–15.0)
MCH: 25.3 pg — ABNORMAL LOW (ref 26.0–34.0)
MCHC: 32.4 g/dL (ref 30.0–36.0)
MCV: 78.1 fL (ref 78.0–100.0)
Platelets: 229 10*3/uL (ref 150–400)
RBC: 4.47 MIL/uL (ref 3.87–5.11)
RDW: 14.6 % (ref 11.5–15.5)
WBC: 5.7 10*3/uL (ref 4.0–10.5)

## 2016-09-23 LAB — I-STAT TROPONIN, ED: TROPONIN I, POC: 0 ng/mL (ref 0.00–0.08)

## 2016-09-23 LAB — BASIC METABOLIC PANEL
Anion gap: 8 (ref 5–15)
BUN: 11 mg/dL (ref 6–20)
CALCIUM: 9.5 mg/dL (ref 8.9–10.3)
CO2: 24 mmol/L (ref 22–32)
CREATININE: 0.84 mg/dL (ref 0.44–1.00)
Chloride: 107 mmol/L (ref 101–111)
GFR calc Af Amer: >60 mL/min (ref >60)
GLUCOSE: 118 mg/dL — AB (ref 65–99)
Potassium: 4.2 mmol/L (ref 3.5–5.1)
Sodium: 139 mmol/L (ref 135–145)

## 2016-09-23 MED ORDER — MECLIZINE HCL 25 MG PO TABS: 25.0000 mg | ORAL_TABLET | Freq: Three times a day (TID) | ORAL | 0 refills | Status: DC | PRN

## 2016-09-23 MED ORDER — NAPROXEN 500 MG PO TABS: 500.0000 mg | ORAL_TABLET | Freq: Two times a day (BID) | ORAL | 0 refills | Status: DC

## 2016-09-23 NOTE — Discharge Instructions (Signed)
Please read and follow all provided instructions.  Your diagnoses today include:  1. Nonspecific chest pain     Tests performed today include:  An EKG of your heart  A chest x-ray  Cardiac enzymes - a blood test for heart muscle damage  Blood counts and electrolytes  Vital signs. See below for your results today.   Medications prescribed:   Naproxen - anti-inflammatory pain medication  Do not exceed 500mg  naproxen every 12 hours, take with food  You have been prescribed an anti-inflammatory medication or NSAID. Take with food. Take smallest effective dose for the shortest duration needed for your pain. Stop taking if you experience stomach pain or vomiting.    Meclizine - medication for spinning dizziness  Take any prescribed medications only as directed.  Follow-up instructions: Please follow-up with your primary care provider as soon as you can for further evaluation of your symptoms.   Return instructions:  SEEK IMMEDIATE MEDICAL ATTENTION IF:  You have severe chest pain, especially if the pain is crushing or pressure-like and spreads to the arms, back, neck, or jaw, or if you have sweating, nausea (feeling sick to your stomach), or shortness of breath. THIS IS AN EMERGENCY. Don't wait to see if the pain will go away. Get medical help at once. Call 911 or [phone] (operator). DO NOT drive yourself to the hospital.   Your chest pain gets worse and does not go away with rest.   You have an attack of chest pain lasting longer than usual, despite rest and treatment with the medications your caregiver has prescribed.   You wake from sleep with chest pain or shortness of breath.  You feel dizzy or faint.  You have chest pain not typical of your usual pain for which you originally saw your caregiver.   You have any other emergent concerns regarding your health.  Additional Information: Chest pain comes from many different causes. Your caregiver has diagnosed you as having  chest pain that is not specific for one problem, but does not require admission.  You are at low risk for an acute heart condition or other serious illness.   Your vital signs today were: BP 113/70 (BP Location: Right Arm)    Pulse 70    Temp 98.1 F (36.7 C) (Oral)    Resp 22    LMP 08/25/2016    SpO2 98%  If your blood pressure (BP) was elevated above 135/85 this visit, please have this repeated by your doctor within one month. --------------

## 2016-09-23 NOTE — ED Provider Notes (Signed)
[facility] DEPT Provider Note   CSN: [id number] Arrival date & time: 09/23/16  1145     History   Chief Complaint Chief Complaint  Patient presents with  . Chest Pain    HPI Diamond Cook is a 30 y.o. female.  Patient presents with complaints of short-lived right upper chest pain with bilateral numbness of her upper extremities lasting for several seconds before completely resolving, this morning. Symptoms occurred at rest. Patient has had similar episodes in the past, but not with numbness of the arms. This is what prompted emergency department visit today. There is no associated shortness of breath, palpitations, diaphoresis, vomiting. No treatments prior to arrival. No fevers or cough. No lower extremity pain or tenderness. Patient states that she has sharp shooting pains throughout her body at times. Patient is on oral contraceptives but does not smoke. She otherwise denies risk factors for pulmonary embolism including: unilateral leg swelling, history of DVT/PE/other blood clots, recent immobilizations, recent surgery, recent travel (>4hr segment), malignancy, hemoptysis. She has h/o HTN, possibly hyperlipidemia however patient was never placed on medications for this. History of diabetes, smoking, or family history of CAD in any young age. The onset of this condition was acute. The course is resolved. Aggravating factors: none. Alleviating factors: none.         Past Medical History:  Diagnosis Date  . Anemia   . Anxiety   . Hypertension   . Obesity   . Vertigo     There are no active problems to display for this patient.   Past Surgical History:  Procedure Laterality Date  . CESAREAN SECTION    . TUBAL LIGATION      OB History    Gravida Para Term Preterm AB Living   3 3 3     3    SAB TAB Ectopic Multiple Live Births           3       Home Medications    Prior to Admission medications   Medication Sig Start Date End Date Taking? Authorizing Provider    amoxicillin-clavulanate (AUGMENTIN) 875-125 MG tablet Take 1 tablet by mouth every 12 (twelve) hours. 09/06/16   Roxy Horsemanobert Browning, PA-C  cetirizine (ZYRTEC ALLERGY) 10 MG tablet Take 1 tablet (10 mg total) by mouth daily. 09/06/16   Roxy Horsemanobert Browning, PA-C  ferrous sulfate 325 (65 FE) MG EC tablet Take 325 mg by mouth daily as needed (when feeling sluggish).     Historical Provider, MD  ibuprofen (ADVIL,MOTRIN) 800 MG tablet Take 1 tablet (800 mg total) by mouth every 8 (eight) hours as needed. Patient taking differently: Take 800 mg by mouth every 8 (eight) hours as needed for headache or moderate pain.  08/01/16   Wilmer FloorLisa A Leftwich-Kirby, CNM  meclizine (ANTIVERT) 25 MG tablet Take 1 tablet (25 mg total) by mouth every 8 (eight) hours as needed for dizziness. 09/23/16   Renne CriglerJoshua Mettie Roylance, PA-C  metoprolol (LOPRESSOR) 50 MG tablet Take 50 mg by mouth 2 (two) times daily.    Historical Provider, MD  naproxen (NAPROSYN) 500 MG tablet Take 1 tablet (500 mg total) by mouth 2 (two) times daily. 09/23/16   Renne CriglerJoshua Lolamae Voisin, PA-C  norgestimate-ethinyl estradiol (ORTHO-CYCLEN,SPRINTEC,PREVIFEM) 0.25-35 MG-MCG tablet Take 1 tablet by mouth daily. 08/01/16   Misty StanleyLisa A Leftwich-Kirby, CNM  promethazine (PHENERGAN) 25 MG tablet Take 0.5-1 tablets (12.5-25 mg total) by mouth every 6 (six) hours as needed for nausea. 08/01/16   Hurshel PartyLisa A Leftwich-Kirby, CNM    Family  History No family history on file.  Social History Social History  Substance Use Topics  . Smoking status: Never Smoker  . Smokeless tobacco: Never Used  . Alcohol use No     Allergies   Patient has no known allergies.   Review of Systems Review of Systems  Constitutional: Negative for diaphoresis and fever.  Eyes: Negative for redness.  Respiratory: Negative for cough and shortness of breath.   Cardiovascular: Positive for chest pain. Negative for palpitations and leg swelling.  Gastrointestinal: Negative for abdominal pain, nausea and vomiting.   Genitourinary: Negative for dysuria.  Musculoskeletal: Negative for back pain and neck pain.  Skin: Negative for rash.  Neurological: Positive for numbness (upper extremity bilateral paresthesias). Negative for syncope and light-headedness.  Psychiatric/Behavioral: The patient is not nervous/anxious.      Physical Exam Updated Vital Signs BP 113/70 (BP Location: Right Arm)   Pulse 70   Temp 98.1 F (36.7 C) (Oral)   Resp 22   LMP 08/25/2016   SpO2 98%   Physical Exam  Constitutional: She appears well-developed and well-nourished.  HENT:  Head: Normocephalic and atraumatic.  Mouth/Throat: Mucous membranes are normal. Mucous membranes are not dry.  Eyes: Conjunctivae are normal.  Neck: Trachea normal and normal range of motion. Neck supple. Normal carotid pulses and no JVD present. No muscular tenderness present. Carotid bruit is not present. No tracheal deviation present.  Cardiovascular: Normal rate, regular rhythm, S1 normal, S2 normal, normal heart sounds and intact distal pulses.  Exam reveals no decreased pulses.   No murmur heard. Pulmonary/Chest: Effort normal. No respiratory distress. She has no wheezes. She exhibits no tenderness.  Abdominal: Soft. Normal aorta and bowel sounds are normal. There is no tenderness. There is no rebound and no guarding.  Musculoskeletal: Normal range of motion.  Neurological: She is alert.  Skin: Skin is warm and dry. She is not diaphoretic. No cyanosis. No pallor.  Psychiatric: Her mood appears anxious.  Nursing note and vitals reviewed.    ED Treatments / Results  Labs (all labs ordered are listed, but only abnormal results are displayed) Labs Reviewed  BASIC METABOLIC PANEL - Abnormal; Notable for the following:       Result Value   Glucose, Bld 118 (*)    All other components within normal limits  CBC - Abnormal; Notable for the following:    Hemoglobin 11.3 (*)    HCT 34.9 (*)    MCH 25.3 (*)    All other components within  normal limits  Rosezena SensorI-STAT TROPOININ, ED   ED ECG REPORT   Date: 09/23/2016  Rate: 71  Rhythm: normal sinus rhythm  QRS Axis: normal  Intervals: normal  ST/T Wave abnormalities: normal  Conduction Disutrbances:none  Narrative Interpretation:   Old EKG Reviewed: unchanged  I have personally reviewed the EKG tracing and agree with the computerized printout as noted.  Radiology Dg Chest 2 View  Result Date: 09/23/2016 CLINICAL DATA:  Sharp chest pain with numbness in the arms and left arm pain, 2 weeks duration. EXAM: CHEST  2 VIEW COMPARISON:  07/18/2016 FINDINGS: Heart size is normal. Mediastinal shadows are normal. The lungs are clear. No bronchial thickening. No infiltrate, mass, effusion or collapse. Pulmonary vascularity is normal. No bony abnormality. IMPRESSION: Normal chest Electronically Signed   By: Paulina FusiMark  Shogry M.D.   On: 09/23/2016 13:24    Procedures Procedures (including critical care time)  Medications Ordered in ED Medications - No data to display   Initial Impression /  Assessment and Plan / ED Course  I have reviewed the triage vital signs and the nursing notes.  Pertinent labs & imaging results that were available during my care of the patient were reviewed by me and considered in my medical decision making (see chart for details).  Clinical Course    Patient seen and examined. We discussed all findings.   Vital signs reviewed and are as follows: BP 113/70 (BP Location: Right Arm)   Pulse 70   Temp 98.1 F (36.7 C) (Oral)   Resp 22   LMP 08/25/2016   SpO2 98%   Patient was counseled to return with severe chest pain, especially if the pain is crushing or pressure-like and spreads to the arms, back, neck, or jaw, or if they have sweating, nausea, or shortness of breath with the pain. They were encouraged to call 911 with these symptoms.   They were also told to return if their chest pain gets worse and does not go away with rest, they have an attack of chest  pain lasting longer than usual despite rest and treatment with the medications their caregiver has prescribed, if they wake from sleep with chest pain or shortness of breath, if they feel dizzy or faint, if they have chest pain not typical of their usual pain, or if they have any other emergent concerns regarding their health.  The patient verbalized understanding and agreed.    Final Clinical Impressions(s) / ED Diagnoses   Final diagnoses:  Nonspecific chest pain   Patient with short-lived, sharp chest pain. Feel patient is low risk for ACS given history (poor story for ACS/MI), negative troponin(s), normal/unchanged EKG. Symptoms are not persistent and are atypical for PE. HEART score = 1.    New Prescriptions New Prescriptions   NAPROXEN (NAPROSYN) 500 MG TABLET    Take 1 tablet (500 mg total) by mouth 2 (two) times daily.     Renne Crigler, PA-C 09/23/16 1505    Laurence Spates, MD 09/27/16 [phone]

## 2016-09-23 NOTE — ED Notes (Signed)
Patient already had taken herself off the monitor, continuous pulse oximetry and blood pressure cuff; Patient dressed and ready to be discharged home; awaiting discharge paperwork

## 2016-09-23 NOTE — ED Triage Notes (Signed)
Pt states "Ive had sharp chest pain and my arms went numb, and pain in my left arm." Pt c/o pain in L arm for two weeks, and CP for a few days. Pt describes it as sharp, center chest pain.

## 2017-01-05 ENCOUNTER — Emergency Department
Admission: EM | Admit: 2017-01-05 | Discharge: 2017-01-05 | Disposition: A | Discharge status: Home / Self Care | Payer: Medicaid Other | Attending: Emergency Medicine | Admitting: Emergency Medicine

## 2017-01-05 ENCOUNTER — Encounter: Payer: Self-pay | Admitting: Emergency Medicine

## 2017-01-05 DIAGNOSIS — I10 Essential (primary) hypertension: Secondary | ICD-10-CM | POA: Diagnosis not present

## 2017-01-05 DIAGNOSIS — R42 Dizziness and giddiness: Secondary | ICD-10-CM | POA: Insufficient documentation

## 2017-01-05 DIAGNOSIS — Z79899 Other long term (current) drug therapy: Secondary | ICD-10-CM | POA: Diagnosis not present

## 2017-01-05 MED ORDER — ONDANSETRON 8 MG PO TBDP
8.0000 mg | ORAL_TABLET | Freq: Once | ORAL | Status: AC
  Administered 2017-01-05: 8 mg via ORAL
  Filled 2017-01-05: qty 1

## 2017-01-05 MED ORDER — MECLIZINE HCL 25 MG PO TABS
50.0000 mg | ORAL_TABLET | Freq: Once | ORAL | Status: AC
  Administered 2017-01-05: 50 mg via ORAL
  Filled 2017-01-05: qty 2

## 2017-01-05 NOTE — ED Triage Notes (Addendum)
Pt presents to ED with sudden onset of dizziness and nausea. Pt denies pain or vomiting. Pt states she was sitting up on her phone when her symptoms started. Pt states she has a hx of vertigo and this feels the same. Pt has meclizine at home but she forgot to take it with her when she went to work.

## 2017-01-05 NOTE — ED Provider Notes (Signed)
[facility] Emergency Department Provider Note   ____________________________________________   First MD Initiated Contact with Patient 01/05/17 [phone]     (approximate)  I have reviewed the triage vital signs and the nursing notes.   HISTORY  Chief Complaint Nausea and Dizziness    HPI Diamond Cook is a 31 y.o. female patient complaining of acute onset of dizziness and nausea at work. Patient denies vomiting. Patient states she was sitting and working on her phone when his symptoms occurred. Patient has history of vertigo that occurs 2-3 times a year. Patient states the first episode this year. Patient states she has Antivert at home but did not take the medicine for further work. Patient denies the vision disturbance or headache. Patient denies any weakness. She denies pain with this complaint. She does not seen any specialist for this complaint. Her family doctor has prescribed Antivert on a when necessary basis.   Past Medical History:  Diagnosis Date  . Anemia   . Anxiety   . Hypertension   . Obesity   . Vertigo     There are no active problems to display for this patient.   Past Surgical History:  Procedure Laterality Date  . CESAREAN SECTION    . TUBAL LIGATION      Prior to Admission medications   Medication Sig Start Date End Date Taking? Authorizing Provider  amoxicillin-clavulanate (AUGMENTIN) 875-125 MG tablet Take 1 tablet by mouth every 12 (twelve) hours. Patient not taking: Reported on 09/23/2016 09/06/16   Roxy Horseman, PA-C  cetirizine (ZYRTEC ALLERGY) 10 MG tablet Take 1 tablet (10 mg total) by mouth daily. Patient not taking: Reported on 09/23/2016 09/06/16   Roxy Horseman, PA-C  ferrous sulfate 325 (65 FE) MG EC tablet Take 325 mg by mouth daily as needed (when feeling sluggish).     Historical Provider, MD  ibuprofen (ADVIL,MOTRIN) 800 MG tablet Take 1 tablet (800 mg total) by mouth every 8 (eight) hours as needed. Patient  taking differently: Take 800 mg by mouth every 8 (eight) hours as needed for headache or moderate pain.  08/01/16   Wilmer Floor Leftwich-Kirby, CNM  meclizine (ANTIVERT) 25 MG tablet Take 1 tablet (25 mg total) by mouth every 8 (eight) hours as needed for dizziness. 09/23/16   Renne Crigler, PA-C  metoprolol (LOPRESSOR) 50 MG tablet Take 50 mg by mouth 2 (two) times daily.    Historical Provider, MD  naproxen (NAPROSYN) 500 MG tablet Take 1 tablet (500 mg total) by mouth 2 (two) times daily. 09/23/16   Renne Crigler, PA-C  norgestimate-ethinyl estradiol (ORTHO-CYCLEN,SPRINTEC,PREVIFEM) 0.25-35 MG-MCG tablet Take 1 tablet by mouth daily. 08/01/16   Misty Stanley A Leftwich-Kirby, CNM  promethazine (PHENERGAN) 25 MG tablet Take 0.5-1 tablets (12.5-25 mg total) by mouth every 6 (six) hours as needed for nausea. Patient not taking: Reported on 09/23/2016 08/01/16   Wilmer Floor Leftwich-Kirby, CNM    Allergies Patient has no known allergies.  No family history on file.  Social History Social History  Substance Use Topics  . Smoking status: Never Smoker  . Smokeless tobacco: Never Used  . Alcohol use No    Review of Systems Constitutional: No fever/chills Eyes: No visual changes. ENT: No sore throat. Cardiovascular: Denies chest pain. Respiratory: Denies shortness of breath. Gastrointestinal: No abdominal pain.  No nausea, no vomiting.  No diarrhea.  No constipation. Genitourinary: Negative for dysuria. Musculoskeletal: Negative for back pain. Skin: Negative for rash. Neurological: Negative for headaches, focal weakness or numbness. Psychiatric:Anxiety  Endocrine:Hypertension ____________________________________________   PHYSICAL EXAM:  VITAL SIGNS: ED Triage Vitals  Enc Vitals Group     BP 01/05/17 0634 (!) 121/43     Pulse Rate 01/05/17 0634 92     Resp 01/05/17 0634 18     Temp 01/05/17 0634 98.3 F (36.8 C)     Temp Source 01/05/17 0634 Oral     SpO2 01/05/17 0634 100 %     Weight 01/05/17  0634 (!) 313 lb (142 kg)     Height 01/05/17 0634 5\' 7"  (1.702 m)     Head Circumference --      Peak Flow --      Pain Score 01/05/17 0642 0     Pain Loc --      Pain Edu? --      Excl. in GC? --     Constitutional: Alert and oriented. Well appearing and in no acute distress. Obesity Eyes: Conjunctivae are normal. PERRL. EOMI. Head: Atraumatic. Nose: No congestion/rhinnorhea. Mouth/Throat: Mucous membranes are moist.  Oropharynx non-erythematous. Neck: No stridor.  No cervical spine tenderness to palpation. Hematological/Lymphatic/Immunilogical: No cervical lymphadenopathy. Cardiovascular: Normal rate, regular rhythm. Grossly normal heart sounds.  Good peripheral circulation. Negative orthostatic findings Respiratory: Normal respiratory effort.  No retractions. Lungs CTAB. Gastrointestinal: Soft and nontender. No distention. No abdominal bruits. No CVA tenderness. Musculoskeletal: No lower extremity tenderness nor edema.  No joint effusions. Neurologic:  Normal speech and language. No gross focal neurologic deficits are appreciated. No gait instability. Skin:  Skin is warm, dry and intact. No rash noted. Psychiatric: Mood and affect are normal. Speech and behavior are normal.  ____________________________________________   LABS (all labs ordered are listed, but only abnormal results are displayed)  Labs Reviewed - No data to display ____________________________________________  EKG   ____________________________________________  RADIOLOGY   ____________________________________________   PROCEDURES  Procedure(s) performed: None  Procedures  Critical Care performed: No  ____________________________________________   INITIAL IMPRESSION / ASSESSMENT AND PLAN / ED COURSE  Pertinent labs & imaging results that were available during my care of the patient were reviewed by me and considered in my medical decision making (see chart for details).  Vertigo. Patient  given discharge Instructions. Patient was given Antivert and Zofran prior to departure.      ____________________________________________   FINAL CLINICAL IMPRESSION(S) / ED DIAGNOSES  Final diagnoses:  Vertigo      NEW MEDICATIONS STARTED DURING THIS VISIT:  New Prescriptions   No medications on file     Note:  This document was prepared using Dragon voice recognition software and may include unintentional dictation errors.    Joni ReiningRonald K Ventura Leggitt, PA-C 01/05/17 0750    Nita Sicklearolina Veronese, MD 01/05/17 [phone]

## 2017-01-05 NOTE — ED Notes (Signed)
Pt sleeping in waiting room chair.

## 2017-01-05 NOTE — ED Notes (Signed)
See triage note   States she developed some dizziness this am around 4 am while at work  Hx of same denies any other sx's

## 2017-02-13 ENCOUNTER — Encounter (HOSPITAL_COMMUNITY): Payer: Self-pay | Admitting: Emergency Medicine

## 2017-02-13 ENCOUNTER — Emergency Department (HOSPITAL_COMMUNITY)
Admission: EM | Admit: 2017-02-13 | Discharge: 2017-02-13 | Disposition: A | Discharge status: Home / Self Care | Payer: Medicaid Other | Attending: Emergency Medicine | Admitting: Emergency Medicine

## 2017-02-13 ENCOUNTER — Emergency Department (HOSPITAL_COMMUNITY): Payer: Medicaid Other

## 2017-02-13 DIAGNOSIS — I10 Essential (primary) hypertension: Secondary | ICD-10-CM | POA: Insufficient documentation

## 2017-02-13 DIAGNOSIS — R079 Chest pain, unspecified: Secondary | ICD-10-CM | POA: Diagnosis present

## 2017-02-13 DIAGNOSIS — R0789 Other chest pain: Secondary | ICD-10-CM | POA: Diagnosis not present

## 2017-02-13 LAB — CBC
HEMATOCRIT: 35.4 % — AB (ref 36.0–46.0)
HEMOGLOBIN: 11.3 g/dL — AB (ref 12.0–15.0)
MCH: 24.9 pg — AB (ref 26.0–34.0)
MCHC: 31.9 g/dL (ref 30.0–36.0)
MCV: 78 fL (ref 78.0–100.0)
Platelets: 249 10*3/uL (ref 150–400)
RBC: 4.54 MIL/uL (ref 3.87–5.11)
RDW: 15.8 % — ABNORMAL HIGH (ref 11.5–15.5)
WBC: 5.4 10*3/uL (ref 4.0–10.5)

## 2017-02-13 LAB — BASIC METABOLIC PANEL
Anion gap: 9 (ref 5–15)
BUN: 10 mg/dL (ref 6–20)
CHLORIDE: 102 mmol/L (ref 101–111)
CO2: 26 mmol/L (ref 22–32)
CREATININE: 0.86 mg/dL (ref 0.44–1.00)
Calcium: 9.1 mg/dL (ref 8.9–10.3)
GFR calc Af Amer: >60 mL/min (ref >60)
GFR calc non Af Amer: >60 mL/min (ref >60)
GLUCOSE: 121 mg/dL — AB (ref 65–99)
POTASSIUM: 3.6 mmol/L (ref 3.5–5.1)
Sodium: 137 mmol/L (ref 135–145)

## 2017-02-13 LAB — I-STAT TROPONIN, ED
Troponin i, poc: 0 ng/mL (ref 0.00–0.08)
Troponin i, poc: 0 ng/mL (ref 0.00–0.08)

## 2017-02-13 NOTE — ED Triage Notes (Signed)
Pt reports right chest pain and left arm pain that began yesterday, no associated injury, reports mild sob. Pt a/ox4, resp e/u, nad.

## 2017-02-13 NOTE — Discharge Instructions (Signed)
Your workup today was reassuring. We do not believe that your pain is due to a heart attack or other concerning process. We advise that you see a heart doctor for recheck of your blood pressure. You would also benefit from a stress test in the next 30 days. Follow-up with your primary care doctor as needed. You may return to the emergency department for new or concerning symptoms.

## 2017-02-15 NOTE — ED Provider Notes (Signed)
[facility]-EMERGENCY DEPT Provider Note   CSN: [id number] Arrival date & time: 02/13/17  1221    History   Chief Complaint Chief Complaint  Patient presents with  . Chest Pain  . Arm Pain    HPI Diamond Cook is a 31 y.o. female.  31 year old female presents to the emergency department for evaluation of chest pain. She reports onset of right-sided chest pain yesterday. Symptoms associated with left arm pain. She reports that pain in her chest and arm are present at the same time. Symptoms have been intermittent. Patient denies any known modifying factors of her symptoms. She denies any recent trauma or injury. No medications taken prior to arrival. Patient has not had any associated fevers. She denies diaphoresis, leg swelling, and shortness of breath. She further denies a history of similar symptoms. No personal or  family history of ACS.   The history is provided by the patient. No language interpreter was used.  Chest Pain    Arm Pain  Associated symptoms include chest pain.    Past Medical History:  Diagnosis Date  . Anemia   . Anxiety   . Hypertension   . Obesity   . Vertigo     There are no active problems to display for this patient.   Past Surgical History:  Procedure Laterality Date  . CESAREAN SECTION    . TUBAL LIGATION      OB History    Gravida Para Term Preterm AB Living   SAB TAB Ectopic Multiple Live Births           3       Home Medications    Prior to Admission medications   Medication Sig Start Date End Date Taking? Authorizing Provider  ferrous sulfate 325 (65 FE) MG EC tablet Take 325 mg by mouth daily.    Yes Historical Provider, MD  hydrochlorothiazide (HYDRODIURIL) 25 MG tablet Take 25 mg by mouth daily.   Yes Historical Provider, MD  ibuprofen (ADVIL,MOTRIN) 800 MG tablet Take 1 tablet (800 mg total) by mouth every 8 (eight) hours as needed. Patient taking differently: Take 800 mg by mouth every 8 (eight) hours as needed for  headache or moderate pain.  08/01/16  Yes Lisa A Leftwich-Kirby, CNM  meclizine (ANTIVERT) 25 MG tablet Take 1 tablet (25 mg total) by mouth every 8 (eight) hours as needed for dizziness. 09/23/16  Yes Renne Crigler, PA-C  norgestimate-ethinyl estradiol (ORTHO-CYCLEN,SPRINTEC,PREVIFEM) 0.25-35 MG-MCG tablet Take 1 tablet by mouth daily. 08/01/16  Yes Lisa A Leftwich-Kirby, CNM  cetirizine (ZYRTEC ALLERGY) 10 MG tablet Take 1 tablet (10 mg total) by mouth daily. Patient not taking: Reported on 09/23/2016 09/06/16   Roxy Horseman, PA-C  naproxen (NAPROSYN) 500 MG tablet Take 1 tablet (500 mg total) by mouth 2 (two) times daily. Patient not taking: Reported on 02/13/2017 09/23/16   Renne Crigler, PA-C  promethazine (PHENERGAN) 25 MG tablet Take 0.5-1 tablets (12.5-25 mg total) by mouth every 6 (six) hours as needed for nausea. Patient not taking: Reported on 09/23/2016 08/01/16   Hurshel Party, CNM    Family History No family history on file.  Social History Social History  Substance Use Topics  . Smoking status: Never Smoker  . Smokeless tobacco: Never Used  . Alcohol use No     Allergies   Patient has no known allergies.   Review of Systems Review of Systems  Cardiovascular: Positive for chest pain.  Ten systems reviewed and are negative for acute change, except as noted in the HPI.    Physical Exam Updated Vital Signs BP 132/84   Pulse 78   Temp 98 F (36.7 C) (Oral)   Resp 19   LMP 01/06/2017 (Approximate)   SpO2 100%   Physical Exam  Constitutional: She is oriented to person, place, and time. She appears well-developed and well-nourished. No distress.  Nontoxic and in NAD. Obese.  HENT:  Head: Normocephalic and atraumatic.  Eyes: Conjunctivae and EOM are normal. No scleral icterus.  Neck: Normal range of motion.  Cardiovascular: Normal rate, regular rhythm and intact distal pulses.   Pulmonary/Chest: Effort normal. No respiratory distress. She has no wheezes.  She has no rales. She exhibits tenderness.  TTP to central and right superior chest wall. No bony deformities or crepitus. Chest expansion symmetric. Lungs clear.  Abdominal: Soft. She exhibits no distension.  Musculoskeletal: Normal range of motion.  No pitting edema in BLE  Neurological: She is alert and oriented to person, place, and time. She exhibits normal muscle tone. Coordination normal.  Skin: Skin is warm and dry. No rash noted. She is not diaphoretic. No erythema. No pallor.  Psychiatric: She has a normal mood and affect. Her behavior is normal.  Nursing note and vitals reviewed.    ED Treatments / Results  Labs (all labs ordered are listed, but only abnormal results are displayed) Labs Reviewed  CBC - Abnormal; Notable for the following:       Result Value   Hemoglobin 11.3 (*)    HCT 35.4 (*)    MCH 24.9 (*)    RDW 15.8 (*)    All other components within normal limits  BASIC METABOLIC PANEL - Abnormal; Notable for the following:    Glucose, Bld 121 (*)    All other components within normal limits  I-STAT TROPOININ, ED  I-STAT TROPOININ, ED    EKG  EKG Interpretation  Date/Time:  Friday February 13 2017 12:37:50 EDT Ventricular Rate:  86 PR Interval:  156 QRS Duration: 90 QT Interval:  372 QTC Calculation: 445 R Axis:   78 Text Interpretation:  Normal sinus rhythm no significant change since Nov 2017 Confirmed by Criss Alvine MD, SCOTT [phone]) on 02/14/2017 3:19:19 PM       Radiology Dg Chest 2 View  Result Date: 02/13/2017 CLINICAL DATA:  Chest and left arm pain.  History of hypertension. EXAM: CHEST  2 VIEW COMPARISON:  09/23/2016 FINDINGS: The cardiomediastinal silhouette is within normal limits. The lungs are well inflated and clear. There is no evidence of pleural effusion or pneumothorax. No acute osseous abnormality is identified. IMPRESSION: No active cardiopulmonary disease. Electronically Signed   By: Sebastian Ache M.D.   On: 02/13/2017 13:14     Procedures Procedures (including critical care time)  Medications Ordered in ED Medications - No data to display   Initial Impression / Assessment and Plan / ED Course  I have reviewed the triage vital signs and the nursing notes.  Pertinent labs & imaging results that were available during my care of the patient were reviewed by me and considered in my medical decision making (see chart for details).     Patient is to be discharged with recommendation to follow up with PCP in regards to today's hospital visit. Chest pain is not likely of cardiac etiology given atypical nature of symptoms. Heart score is 1 c/w low risk of ACS. Also doubt PE given lack of tachycardia, tachypnea, dyspnea,  or hypoxia. Pain is reproducible on palpation, lending itself to MSK etiology. Patient advised to follow-up with her primary care doctor regarding her visit today. Return precautions discussed and provided. Patient discharged in stable condition with no unaddressed concerns.   Vitals:   02/13/17 1519 02/13/17 1530 02/13/17 1600 02/13/17 1615  BP:  108/69 128/83 132/84  Pulse: 78 74 80 78  Resp: Temp:      TempSrc:      SpO2: 99% 98% 100% 100%    Final Clinical Impressions(s) / ED Diagnoses   Final diagnoses:  Atypical chest pain    New Prescriptions Discharge Medication List as of 02/13/2017  4:10 PM       Antony Madura, PA-C 02/15/17 1610    Arby Barrette, MD 02/15/17 2326

## 2017-03-21 ENCOUNTER — Encounter: Payer: Self-pay | Admitting: Emergency Medicine

## 2017-03-21 ENCOUNTER — Emergency Department
Admission: EM | Admit: 2017-03-21 | Discharge: 2017-03-21 | Disposition: A | Discharge status: Home / Self Care | Payer: Medicaid Other | Attending: Emergency Medicine | Admitting: Emergency Medicine

## 2017-03-21 ENCOUNTER — Emergency Department: Payer: Medicaid Other

## 2017-03-21 DIAGNOSIS — R0789 Other chest pain: Secondary | ICD-10-CM | POA: Insufficient documentation

## 2017-03-21 DIAGNOSIS — R519 Headache, unspecified: Secondary | ICD-10-CM

## 2017-03-21 DIAGNOSIS — R079 Chest pain, unspecified: Secondary | ICD-10-CM

## 2017-03-21 DIAGNOSIS — I10 Essential (primary) hypertension: Secondary | ICD-10-CM | POA: Insufficient documentation

## 2017-03-21 DIAGNOSIS — R51 Headache: Secondary | ICD-10-CM | POA: Insufficient documentation

## 2017-03-21 LAB — BASIC METABOLIC PANEL
ANION GAP: 8 (ref 5–15)
BUN: 14 mg/dL (ref 6–20)
CHLORIDE: 100 mmol/L — AB (ref 101–111)
CO2: 29 mmol/L (ref 22–32)
Calcium: 9.1 mg/dL (ref 8.9–10.3)
Creatinine, Ser: 0.82 mg/dL (ref 0.44–1.00)
GFR calc non Af Amer: >60 mL/min (ref >60)
Glucose, Bld: 101 mg/dL — ABNORMAL HIGH (ref 65–99)
POTASSIUM: 3.3 mmol/L — AB (ref 3.5–5.1)
SODIUM: 137 mmol/L (ref 135–145)

## 2017-03-21 LAB — CBC
HEMATOCRIT: 35.2 % (ref 35.0–47.0)
HEMOGLOBIN: 11.6 g/dL — AB (ref 12.0–16.0)
MCH: 25.3 pg — ABNORMAL LOW (ref 26.0–34.0)
MCHC: 33.1 g/dL (ref 32.0–36.0)
MCV: 76.4 fL — AB (ref 80.0–100.0)
Platelets: 198 10*3/uL (ref 150–440)
RBC: 4.61 MIL/uL (ref 3.80–5.20)
RDW: 15.7 % — ABNORMAL HIGH (ref 11.5–14.5)
WBC: 7.3 10*3/uL (ref 3.6–11.0)

## 2017-03-21 LAB — FIBRIN DERIVATIVES D-DIMER (ARMC ONLY): FIBRIN DERIVATIVES D-DIMER (ARMC): 623.1 — AB (ref 0.00–499.00)

## 2017-03-21 LAB — TROPONIN I
Troponin I: <0.03 ng/mL (ref <0.03)
Troponin I: <0.03 ng/mL (ref <0.03)

## 2017-03-21 MED ORDER — IOPAMIDOL (ISOVUE-370) INJECTION 76%
75.0000 mL | Freq: Once | INTRAVENOUS | Status: AC | PRN
  Administered 2017-03-21: 75 mL via INTRAVENOUS

## 2017-03-21 NOTE — ED Provider Notes (Signed)
[facility] Emergency Department Provider Note    First MD Initiated Contact with Patient 03/21/17 [phone]     (approximate)  I have reviewed the triage vital signs and the nursing notes.   HISTORY  Chief Complaint Chest Pain and Headache   HPI Diamond Cook is a 31 y.o. female with below list of chronic medical conditions including vertigo presents to the emergency department 1 day history of frontal headache and blurred vision. Patient also admits to intermittent dizziness. States that this has been occurring "for a while". Patient also admits to chest discomfort is worse with movement today stating whenever she bends her back for her central chest area is uncomfortable. Patient denies any dyspnea no joint pain or swelling. Current pain score 4 out of 10.   Past Medical History:  Diagnosis Date  . Anemia   . Anxiety   . Hypertension   . Obesity   . Vertigo     There are no active problems to display for this patient.   Past Surgical History:  Procedure Laterality Date  . CESAREAN SECTION    . TUBAL LIGATION      Prior to Admission medications   Medication Sig Start Date End Date Taking? Authorizing Provider  cetirizine (ZYRTEC ALLERGY) 10 MG tablet Take 1 tablet (10 mg total) by mouth daily. Patient not taking: Reported on 09/23/2016 09/06/16   Roxy Horseman, PA-C  ferrous sulfate 325 (65 FE) MG EC tablet Take 325 mg by mouth daily.     [provider]  hydrochlorothiazide (HYDRODIURIL) 25 MG tablet Take 25 mg by mouth daily.    [provider]  ibuprofen (ADVIL,MOTRIN) 800 MG tablet Take 1 tablet (800 mg total) by mouth every 8 (eight) hours as needed. Patient taking differently: Take 800 mg by mouth every 8 (eight) hours as needed for headache or moderate pain.  08/01/16   Leftwich-Kirby, Wilmer Floor, CNM  meclizine (ANTIVERT) 25 MG tablet Take 1 tablet (25 mg total) by mouth every 8 (eight) hours as needed for dizziness. 09/23/16    Renne Crigler, PA-C  naproxen (NAPROSYN) 500 MG tablet Take 1 tablet (500 mg total) by mouth 2 (two) times daily. Patient not taking: Reported on 02/13/2017 09/23/16   Renne Crigler, PA-C  norgestimate-ethinyl estradiol (ORTHO-CYCLEN,SPRINTEC,PREVIFEM) 0.25-35 MG-MCG tablet Take 1 tablet by mouth daily. 08/01/16   Leftwich-Kirby, Wilmer Floor, CNM  promethazine (PHENERGAN) 25 MG tablet Take 0.5-1 tablets (12.5-25 mg total) by mouth every 6 (six) hours as needed for nausea. Patient not taking: Reported on 09/23/2016 08/01/16   Sharen Counter A, CNM    Allergies No known drug allergies History reviewed. No pertinent family history.  Social History Social History  Substance Use Topics  . Smoking status: Never Smoker  . Smokeless tobacco: Never Used  . Alcohol use No    Review of Systems Constitutional: No fever/chills Eyes: No visual changes. ENT: No sore throat. Cardiovascular: Denies chest pain. Respiratory: Denies shortness of breath. Gastrointestinal: No abdominal pain.  No nausea, no vomiting.  No diarrhea.  No constipation. Genitourinary: Negative for dysuria. Musculoskeletal: Negative for back pain. Integumentary: Negative for rash. Neurological: Negative for headaches, focal weakness or numbness.   ____________________________________________   PHYSICAL EXAM:  VITAL SIGNS: ED Triage Vitals  Enc Vitals Group     BP 03/21/17 0129 124/83     Pulse Rate 03/21/17 0129 99     Resp 03/21/17 0129 20     Temp 03/21/17 0129 98.4 F (36.9 C)  Temp Source 03/21/17 0129 Oral     SpO2 03/21/17 0129 98 %     Weight 03/21/17 0127 (!) 310 lb (140.6 kg)     Height 03/21/17 0127 5\' 7"  (1.702 m)     Head Circumference --      Peak Flow --      Pain Score 03/21/17 0127 4     Pain Loc --      Pain Edu? --      Excl. in GC? --     Constitutional: Alert and oriented. Well appearing and in no acute distress. Eyes: Conjunctivae are normal. PERRL. EOMI. Head:  Atraumatic. Mouth/Throat: Mucous membranes are moist.  Oropharynx non-erythematous. Neck: No stridor.  Cardiovascular: Normal rate, regular rhythm. Good peripheral circulation. Grossly normal heart sounds. Respiratory: Normal respiratory effort.  No retractions. Lungs CTAB. Gastrointestinal: Soft and nontender. No distention.  Musculoskeletal: No lower extremity tenderness nor edema. No gross deformities of extremities. Neurologic:  Normal speech and language. No gross focal neurologic deficits are appreciated.  Skin:  Skin is warm, dry and intact. No rash noted. Psychiatric: Mood and affect are normal. Speech and behavior are normal.  ____________________________________________   LABS (all labs ordered are listed, but only abnormal results are displayed)  Labs Reviewed  BASIC METABOLIC PANEL - Abnormal; Notable for the following:       Result Value   Potassium 3.3 (*)    Chloride 100 (*)    Glucose, Bld 101 (*)    All other components within normal limits  CBC - Abnormal; Notable for the following:    Hemoglobin 11.6 (*)    MCV 76.4 (*)    MCH 25.3 (*)    RDW 15.7 (*)    All other components within normal limits  TROPONIN I  TROPONIN I  FIBRIN DERIVATIVES D-DIMER ([facility] ONLY)   ____________________________________________  EKG  ED ECG REPORT I, [place] Erskine Steinfeldt, the attending physician, personally viewed and interpreted this ECG.   Date: 03/21/2017  EKG Time: 1:32 AM  Rate: 93  Rhythm: Normal sinus rhythm  Axis: Normal  Intervals: Normal  ST&T Change: None  ____________________________________________  RADIOLOGY I, [place] Gudelia Eugene, personally viewed and evaluated these images (plain radiographs) as part of my medical decision making, as well as reviewing the written report by the radiologist.  Dg Chest 2 View  Result Date: 03/21/2017 CLINICAL DATA:  Right chest pain and left arm pain, onset yesterday without trauma. EXAM: CHEST  2 VIEW COMPARISON:  02/13/2017  FINDINGS: The lungs are clear. The pulmonary vasculature is normal. Heart size is normal. Hilar and mediastinal contours are unremarkable. There is no pleural effusion. IMPRESSION: No active cardiopulmonary disease. Electronically Signed   By: Ellery Plunk M.D.   On: 03/21/2017 02:02      Procedures   ____________________________________________   INITIAL IMPRESSION / ASSESSMENT AND PLAN / ED COURSE  Pertinent labs & imaging results that were available during my care of the patient were reviewed by me and considered in my medical decision making (see chart for details).  ----------------------------------------- 5:14 AM on 03/21/2017 -----------------------------------------  Patient requesting to leave the emergency department at this time stating that she has to go to work. CT angiogram not resulted plan was to obtain a repeat troponin as well. I informed the patient of the potential risk of doing so. Patient left AMA before CT angiogram or laboratory data completed.      ____________________________________________  FINAL CLINICAL IMPRESSION(S) / ED DIAGNOSES  Final diagnoses:  Nonspecific chest pain  Acute nonintractable headache, unspecified headache type     MEDICATIONS GIVEN DURING THIS VISIT:  Medications  iopamidol (ISOVUE-370) 76 % injection 75 mL (75 mLs Intravenous Contrast Given 03/21/17 0418)     NEW OUTPATIENT MEDICATIONS STARTED DURING THIS VISIT:  New Prescriptions   No medications on file    Modified Medications   No medications on file    Discontinued Medications   No medications on file     Note:  This document was prepared using Dragon voice recognition software and may include unintentional dictation errors.    Darci CurrentBrown, [place] N, MD 03/21/17 [phone]

## 2017-03-21 NOTE — ED Notes (Signed)
Pt to stat desk to inquire about wait, pt informed she is next to go back.  Pt stating she needs to leave by 5am and will wait until 4am.  Pt in NAD at this time, ambulatory in lobby

## 2017-03-21 NOTE — ED Notes (Addendum)
Patient requests that [facility] contact her with her results at [phone] She states she is unable to wait because her job has a very strict attendance policy.

## 2017-03-21 NOTE — ED Triage Notes (Signed)
Pt ambulatory to triage in NAD, report headache and chest pain x 1 day, CP to left chest radiating to left arm.  Pt NAD at this time, resp equal and unlabored, skin warm and dry.

## 2017-03-21 NOTE — ED Notes (Signed)
This RN consulted with Manson PasseyBrown MD and I called patient at her previously listed phone number to tell her that her CT scan results were normal.  The patient seemed relieved.

## 2017-05-29 ENCOUNTER — Encounter (HOSPITAL_COMMUNITY): Payer: Self-pay

## 2017-05-29 DIAGNOSIS — I10 Essential (primary) hypertension: Secondary | ICD-10-CM | POA: Diagnosis not present

## 2017-05-29 DIAGNOSIS — R1084 Generalized abdominal pain: Secondary | ICD-10-CM | POA: Diagnosis present

## 2017-05-29 DIAGNOSIS — Z79899 Other long term (current) drug therapy: Secondary | ICD-10-CM | POA: Diagnosis not present

## 2017-05-29 DIAGNOSIS — N946 Dysmenorrhea, unspecified: Secondary | ICD-10-CM | POA: Diagnosis not present

## 2017-05-29 DIAGNOSIS — D649 Anemia, unspecified: Secondary | ICD-10-CM | POA: Diagnosis not present

## 2017-05-29 LAB — URINALYSIS, ROUTINE W REFLEX MICROSCOPIC
BACTERIA UA: NONE SEEN
BILIRUBIN URINE: NEGATIVE
Glucose, UA: NEGATIVE mg/dL
KETONES UR: NEGATIVE mg/dL
NITRITE: NEGATIVE
Protein, ur: 30 mg/dL — AB
Specific Gravity, Urine: 1.025 (ref 1.005–1.030)
pH: 7 (ref 5.0–8.0)

## 2017-05-29 LAB — COMPREHENSIVE METABOLIC PANEL
ALBUMIN: 3.3 g/dL — AB (ref 3.5–5.0)
ALT: 12 U/L — ABNORMAL LOW (ref 14–54)
ANION GAP: 11 (ref 5–15)
AST: 14 U/L — ABNORMAL LOW (ref 15–41)
Alkaline Phosphatase: 69 U/L (ref 38–126)
BILIRUBIN TOTAL: 0.3 mg/dL (ref 0.3–1.2)
BUN: 10 mg/dL (ref 6–20)
CO2: 25 mmol/L (ref 22–32)
Calcium: 9 mg/dL (ref 8.9–10.3)
Chloride: 103 mmol/L (ref 101–111)
Creatinine, Ser: 0.76 mg/dL (ref 0.44–1.00)
GFR calc Af Amer: >60 mL/min (ref >60)
GFR calc non Af Amer: >60 mL/min (ref >60)
GLUCOSE: 100 mg/dL — AB (ref 65–99)
POTASSIUM: 3.4 mmol/L — AB (ref 3.5–5.1)
SODIUM: 139 mmol/L (ref 135–145)
TOTAL PROTEIN: 7.3 g/dL (ref 6.5–8.1)

## 2017-05-29 LAB — CBC
HCT: 35 % — ABNORMAL LOW (ref 36.0–46.0)
HEMOGLOBIN: 11.2 g/dL — AB (ref 12.0–15.0)
MCH: 25.1 pg — ABNORMAL LOW (ref 26.0–34.0)
MCHC: 32 g/dL (ref 30.0–36.0)
MCV: 78.3 fL (ref 78.0–100.0)
Platelets: 209 10*3/uL (ref 150–400)
RBC: 4.47 MIL/uL (ref 3.87–5.11)
RDW: 14.6 % (ref 11.5–15.5)
WBC: 5.9 10*3/uL (ref 4.0–10.5)

## 2017-05-29 LAB — LIPASE, BLOOD: Lipase: 22 U/L (ref 11–51)

## 2017-05-29 MED ORDER — OXYCODONE-ACETAMINOPHEN 5-325 MG PO TABS
1.0000 | ORAL_TABLET | ORAL | Status: DC | PRN
  Administered 2017-05-29: 1 via ORAL

## 2017-05-29 MED ORDER — OXYCODONE-ACETAMINOPHEN 5-325 MG PO TABS
ORAL_TABLET | ORAL | Status: AC
  Filled 2017-05-29: qty 1

## 2017-05-29 NOTE — ED Triage Notes (Signed)
Pt states that she is having abd cramps from the past three days since her period began along with some nausea, denies vomiting or diarrhea. Pt also adds that she has has L sided facial tingling for the past three days along with headache, neuro intact.

## 2017-05-30 ENCOUNTER — Emergency Department (HOSPITAL_COMMUNITY)
Admission: EM | Admit: 2017-05-30 | Discharge: 2017-05-30 | Disposition: A | Discharge status: Home / Self Care | Payer: Medicaid Other | Attending: Emergency Medicine | Admitting: Emergency Medicine

## 2017-05-30 DIAGNOSIS — N946 Dysmenorrhea, unspecified: Secondary | ICD-10-CM

## 2017-05-30 LAB — PREGNANCY, URINE: Preg Test, Ur: NEGATIVE

## 2017-05-30 MED ORDER — FERROUS SULFATE 325 (65 FE) MG PO TABS: 325.0000 mg | ORAL_TABLET | Freq: Every day | ORAL | 0 refills | Status: DC

## 2017-05-30 MED ORDER — KETOROLAC TROMETHAMINE 60 MG/2ML IM SOLN
60.0000 mg | Freq: Once | INTRAMUSCULAR | Status: AC
  Administered 2017-05-30: 60 mg via INTRAMUSCULAR
  Filled 2017-05-30: qty 2

## 2017-05-30 MED ORDER — IBUPROFEN 800 MG PO TABS: 800.0000 mg | ORAL_TABLET | Freq: Three times a day (TID) | ORAL | 0 refills | Status: DC | PRN

## 2017-05-30 NOTE — ED Provider Notes (Signed)
[facility]-EMERGENCY DEPT Provider Note   CSN: [id number] Arrival date & time: 05/29/17  2104 By signing my name below, I, Levon HedgerElizabeth Hall, attest that this documentation has been prepared under the direction and in the presence of Pricilla LovelessGoldston, Benjiman Sedgwick, MD . Electronically Signed: Levon HedgerElizabeth Hall, Scribe. 05/30/2017. 4:05 AM.   History   Chief Complaint Chief Complaint  Patient presents with  . Abdominal Pain   HPI Diamond Cook is a 31 y.o. female who presents to the Emergency Department complaining of suprapubic abdominal pain onset three days ago. She reports associated nausea, lower back pain and left leg pain. She has taken 800 mg ibuprofen 1x and Midol with no relief of pain. No alleviating or modifying factors noted.  Pt expresses that these symptoms are typical for her when on her period. She started her period three days ago when symptoms began. Pt was prescribed birth control pills to help prevent these symptoms, but she does not take her birth control pills every day. Pt states "I think they make me sick on my stomach". [place] and has not established care with an OBGYN in the area. Pt also reports intermittent, sharp headache and left-sided facial tingling earlier this week, but denies any headache or tingling at this time. She denies any suspicion for STI. She denies any fever, dysuria, difficulty urinating, vomiting, or diarrhea.    The history is provided by the patient. No language interpreter was used.   Past Medical History:  Diagnosis Date  . Anemia   . Anxiety   . Hypertension   . Obesity   . Vertigo     There are no active problems to display for this patient.   Past Surgical History:  Procedure Laterality Date  . CESAREAN SECTION    . TUBAL LIGATION      OB History    Gravida Para Term Preterm AB Living   3 3 3     3    SAB TAB Ectopic Multiple Live Births           3       Home Medications    Prior to Admission medications     Medication Sig Start Date End Date Taking? Authorizing Provider  hydrochlorothiazide (HYDRODIURIL) 25 MG tablet Take 25 mg by mouth daily.   Yes [provider]  norgestimate-ethinyl estradiol (ORTHO-CYCLEN,SPRINTEC,PREVIFEM) 0.25-35 MG-MCG tablet Take 1 tablet by mouth daily. 08/01/16  Yes Leftwich-Kirby, Wilmer FloorLisa A, CNM  cetirizine (ZYRTEC ALLERGY) 10 MG tablet Take 1 tablet (10 mg total) by mouth daily. Patient not taking: Reported on 09/23/2016 09/06/16   Roxy HorsemanBrowning, Robert, PA-C  ferrous sulfate 325 (65 FE) MG tablet Take 1 tablet (325 mg total) by mouth daily. 05/30/17   Pricilla LovelessGoldston, Rashod Gougeon, MD  ibuprofen (ADVIL,MOTRIN) 800 MG tablet Take 1 tablet (800 mg total) by mouth every 8 (eight) hours as needed. 05/30/17   Pricilla LovelessGoldston, Romayne Ticas, MD  meclizine (ANTIVERT) 25 MG tablet Take 1 tablet (25 mg total) by mouth every 8 (eight) hours as needed for dizziness. Patient not taking: Reported on 05/30/2017 09/23/16   Renne CriglerGeiple, Joshua, PA-C  naproxen (NAPROSYN) 500 MG tablet Take 1 tablet (500 mg total) by mouth 2 (two) times daily. Patient not taking: Reported on 02/13/2017 09/23/16   Renne CriglerGeiple, Joshua, PA-C  promethazine (PHENERGAN) 25 MG tablet Take 0.5-1 tablets (12.5-25 mg total) by mouth every 6 (six) hours as needed for nausea. Patient not taking: Reported on 05/30/2017 08/01/16   [place]hel PartyLeftwich-Kirby, Lisa A, CNM  Family History No family history on file.  Social History Social History  Substance Use Topics  . Smoking status: Never Smoker  . Smokeless tobacco: Never Used  . Alcohol use No     Allergies   Patient has no known allergies.   Review of Systems Review of Systems  Constitutional: Negative for fever.  Gastrointestinal: Positive for abdominal pain and nausea. Negative for diarrhea and vomiting.  Genitourinary: Negative for difficulty urinating and dysuria.  Musculoskeletal: Positive for back pain and myalgias.  Neurological: Negative for numbness and headaches.  All other systems reviewed  and are negative.  Physical Exam Updated Vital Signs BP 111/74   Pulse 75   Temp 98 F (36.7 C)   Resp 20   Ht 5\' 7"  (1.702 m)   Wt (!) 142 kg (313 lb)   SpO2 100%   BMI 49.02 kg/m   Physical Exam  Constitutional: She is oriented to person, place, and time. She appears well-developed and well-nourished.  Morbidly obese  HENT:  Head: Normocephalic and atraumatic.  Right Ear: External ear normal.  Left Ear: External ear normal.  Nose: Nose normal.  Eyes: Pupils are equal, round, and reactive to light. EOM are normal. Right eye exhibits no discharge. Left eye exhibits no discharge.  Cardiovascular: Normal rate, regular rhythm and normal heart sounds.   Pulmonary/Chest: Effort normal and breath sounds normal.  Abdominal: Soft. There is tenderness.  Suprapubic tenderness  Neurological: She is alert and oriented to person, place, and time.  CN 3-12 grossly intact. 5/5 strength in all 4 extremities. Grossly normal sensation. Normal finger to nose.   Skin: Skin is warm and dry.  Nursing note and vitals reviewed.  ED Treatments / Results  DIAGNOSTIC STUDIES:  Oxygen Saturation is 100% on RA, normal by my interpretation.    COORDINATION OF CARE:  4:03 AM Discussed treatment plan with pt at bedside and pt agreed to plan.   Labs (all labs ordered are listed, but only abnormal results are displayed) Labs Reviewed  COMPREHENSIVE METABOLIC PANEL - Abnormal; Notable for the following:       Result Value   Potassium 3.4 (*)    Glucose, Bld 100 (*)    Albumin 3.3 (*)    AST 14 (*)    ALT 12 (*)    All other components within normal limits  CBC - Abnormal; Notable for the following:    Hemoglobin 11.2 (*)    HCT 35.0 (*)    MCH 25.1 (*)    All other components within normal limits  URINALYSIS, ROUTINE W REFLEX MICROSCOPIC - Abnormal; Notable for the following:    APPearance HAZY (*)    Hgb urine dipstick LARGE (*)    Protein, ur 30 (*)    Leukocytes, UA TRACE (*)     Squamous Epithelial / LPF 0-5 (*)    All other components within normal limits  LIPASE, BLOOD  PREGNANCY, URINE    EKG  EKG Interpretation None       Radiology No results found.  Procedures Procedures (including critical care time)  Medications Ordered in ED Medications  oxyCODONE-acetaminophen (PERCOCET/ROXICET) 5-325 MG per tablet 1 tablet (1 tablet Oral Given 05/29/17 2248)  ketorolac (TORADOL) injection 60 mg (60 mg Intramuscular Given 05/30/17 0457)     Initial Impression / Assessment and Plan / ED Course  I have reviewed the triage vital signs and the nursing notes.  Pertinent labs & imaging results that were available during my care of the  patient were reviewed by me and considered in my medical decision making (see chart for details).     Patient's presentation is c/w menstrual pain. This is a recurrent issue for her. I have given her IM Toradol. She is not pregnant. She declines pelvic examination. However she has no suspicion for STI and is otherwise seems like a typical presentation for her menstrual cycle. I discussed that she needs to be on ibuprofen regularly during this time as opposed to just once in the last 24 hours. I will give her a prescription for ibuprofen and restart her on her ferrous sulfate at her request. Also discussed that this is the reason she is on birth control she is restart this. Will refer to [facility] at [facility]. Discussed return precautions. Otherwise I have low suspicion of an acute intra-abdominal emergency. It may be that she has fibroids causing such severe pain but she has no lateralizing symptoms and thus no emergent need of an ultrasound.  Final Clinical Impressions(s) / ED Diagnoses   Final diagnoses:  Menstrual pain    New Prescriptions Discharge Medication List as of 05/30/2017  5:01 AM    START taking these medications   Details  ferrous sulfate 325 (65 FE) MG tablet Take 1 tablet (325 mg total) by mouth daily.,  Starting Sat 05/30/2017, Print       I personally performed the services described in this documentation, which was scribed in my presence. The recorded information has been reviewed and is accurate.    Pricilla Loveless, MD 05/30/17 [phone]

## 2017-06-10 ENCOUNTER — Ambulatory Visit: Payer: Medicaid Other | Admitting: Podiatry

## 2017-08-05 ENCOUNTER — Emergency Department (HOSPITAL_COMMUNITY): Payer: Medicaid Other

## 2017-08-05 ENCOUNTER — Emergency Department (HOSPITAL_COMMUNITY)
Admission: EM | Admit: 2017-08-05 | Discharge: 2017-08-05 | Discharge status: Against Medical Advice | Payer: Medicaid Other | Attending: Emergency Medicine | Admitting: Emergency Medicine

## 2017-08-05 ENCOUNTER — Encounter (HOSPITAL_COMMUNITY): Payer: Self-pay

## 2017-08-05 DIAGNOSIS — Z5321 Procedure and treatment not carried out due to patient leaving prior to being seen by health care provider: Secondary | ICD-10-CM | POA: Insufficient documentation

## 2017-08-05 DIAGNOSIS — R079 Chest pain, unspecified: Secondary | ICD-10-CM | POA: Diagnosis present

## 2017-08-05 LAB — BASIC METABOLIC PANEL
Anion gap: 9 (ref 5–15)
BUN: 12 mg/dL (ref 6–20)
CALCIUM: 9 mg/dL (ref 8.9–10.3)
CHLORIDE: 105 mmol/L (ref 101–111)
CO2: 24 mmol/L (ref 22–32)
CREATININE: 0.86 mg/dL (ref 0.44–1.00)
GFR calc Af Amer: >60 mL/min (ref >60)
GFR calc non Af Amer: >60 mL/min (ref >60)
Glucose, Bld: 102 mg/dL — ABNORMAL HIGH (ref 65–99)
Potassium: 3.7 mmol/L (ref 3.5–5.1)
SODIUM: 138 mmol/L (ref 135–145)

## 2017-08-05 LAB — CBC
HCT: 35.5 % — ABNORMAL LOW (ref 36.0–46.0)
Hemoglobin: 11.1 g/dL — ABNORMAL LOW (ref 12.0–15.0)
MCH: 24.7 pg — AB (ref 26.0–34.0)
MCHC: 31.3 g/dL (ref 30.0–36.0)
MCV: 78.9 fL (ref 78.0–100.0)
PLATELETS: 209 10*3/uL (ref 150–400)
RBC: 4.5 MIL/uL (ref 3.87–5.11)
RDW: 14.3 % (ref 11.5–15.5)
WBC: 6.7 10*3/uL (ref 4.0–10.5)

## 2017-08-05 LAB — I-STAT TROPONIN, ED: TROPONIN I, POC: 0 ng/mL (ref 0.00–0.08)

## 2017-08-05 NOTE — ED Triage Notes (Signed)
Pt states that has had heart palpations on and off for the past week, worse tonight when laying down, CP radiates to L arm with SOB.

## 2017-08-05 NOTE — ED Notes (Signed)
Patient alert and oriented, states that she doesn't want to wait any longer

## 2017-09-02 IMAGING — DX DG CHEST 2V
2 series · 2 of 2 positions shown · non-contrast
Comparison: 07/18/2016

CLINICAL DATA: Sharp chest pain with numbness in the arms and left
arm pain, 2 weeks duration.

EXAM:
CHEST  2 VIEW

[chest pa]
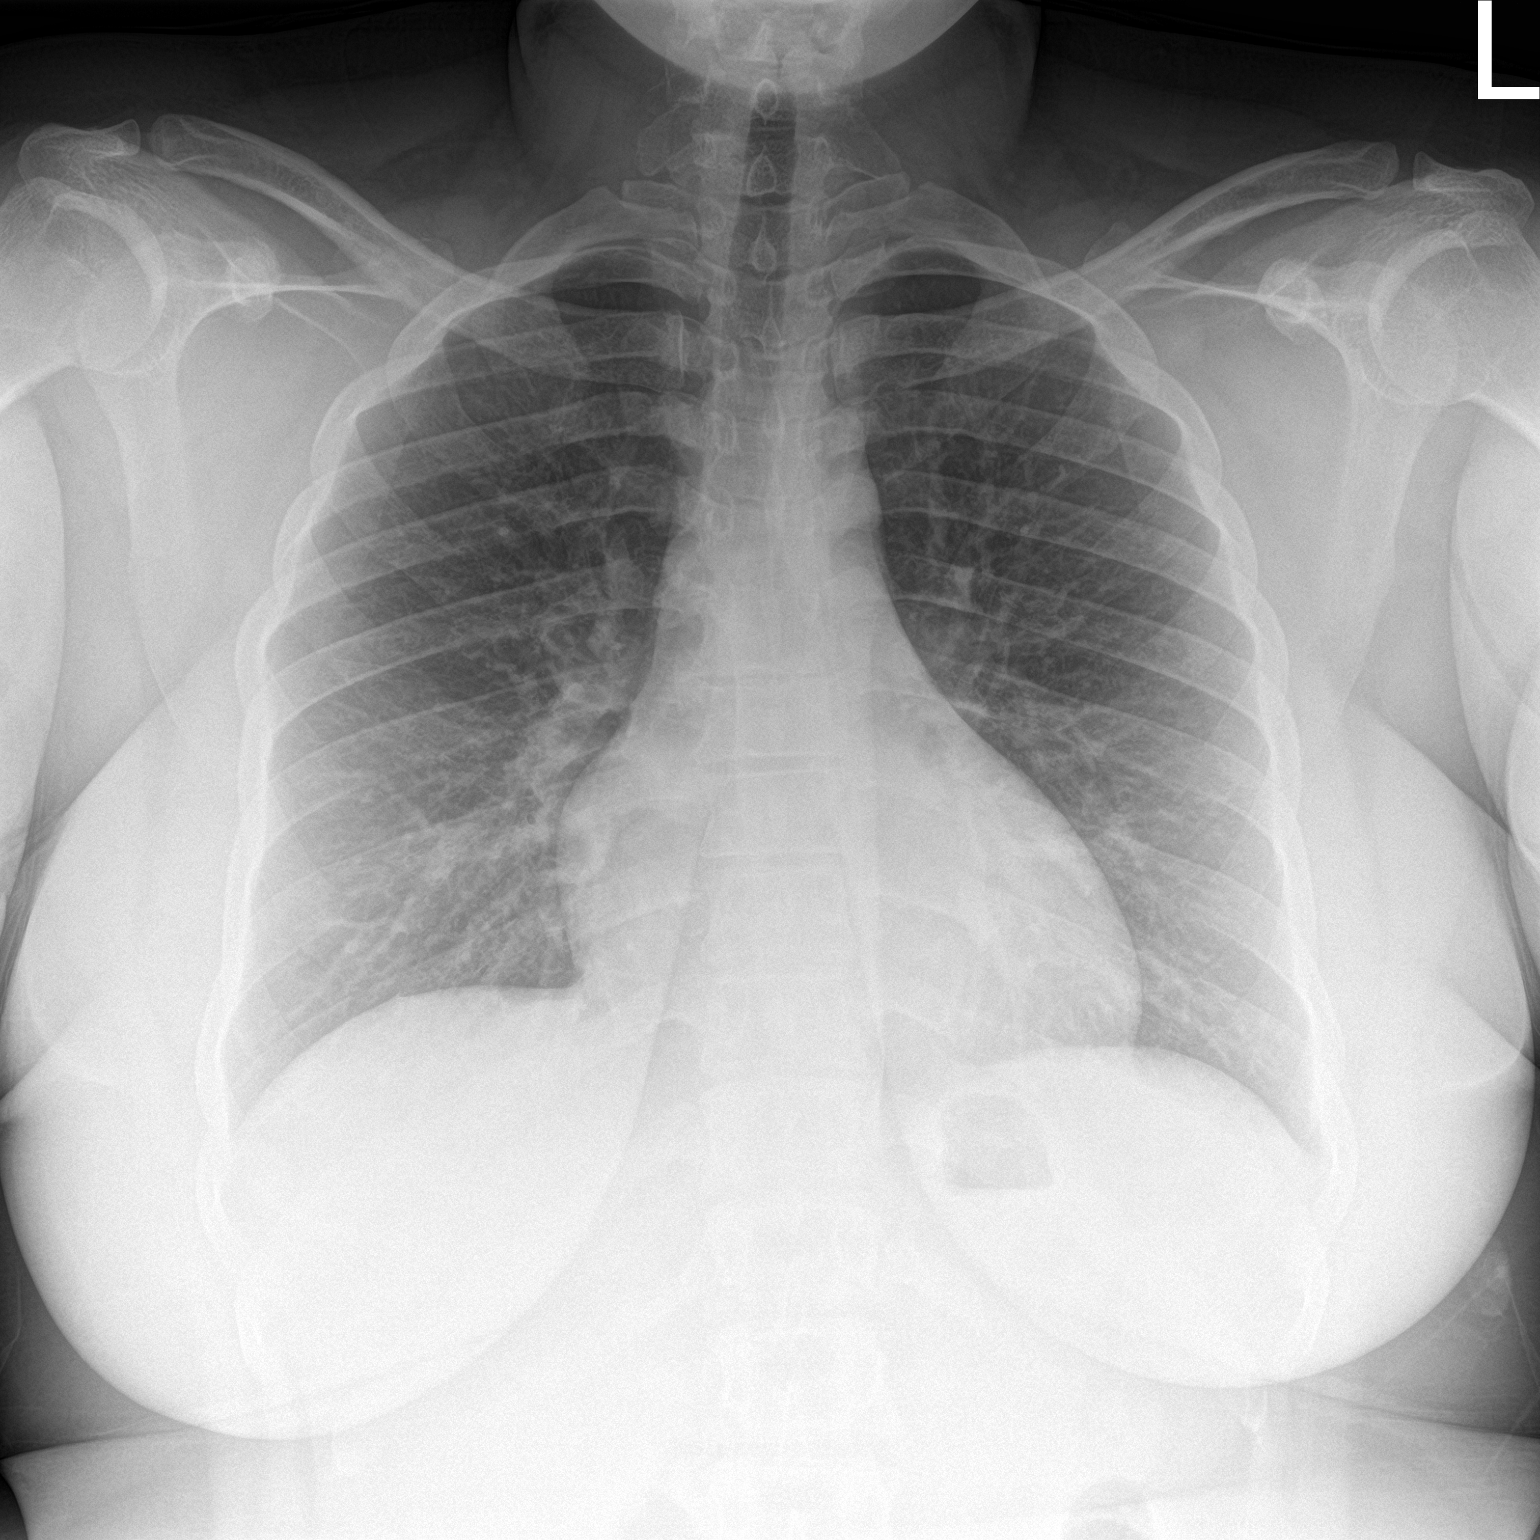

[chest lat]
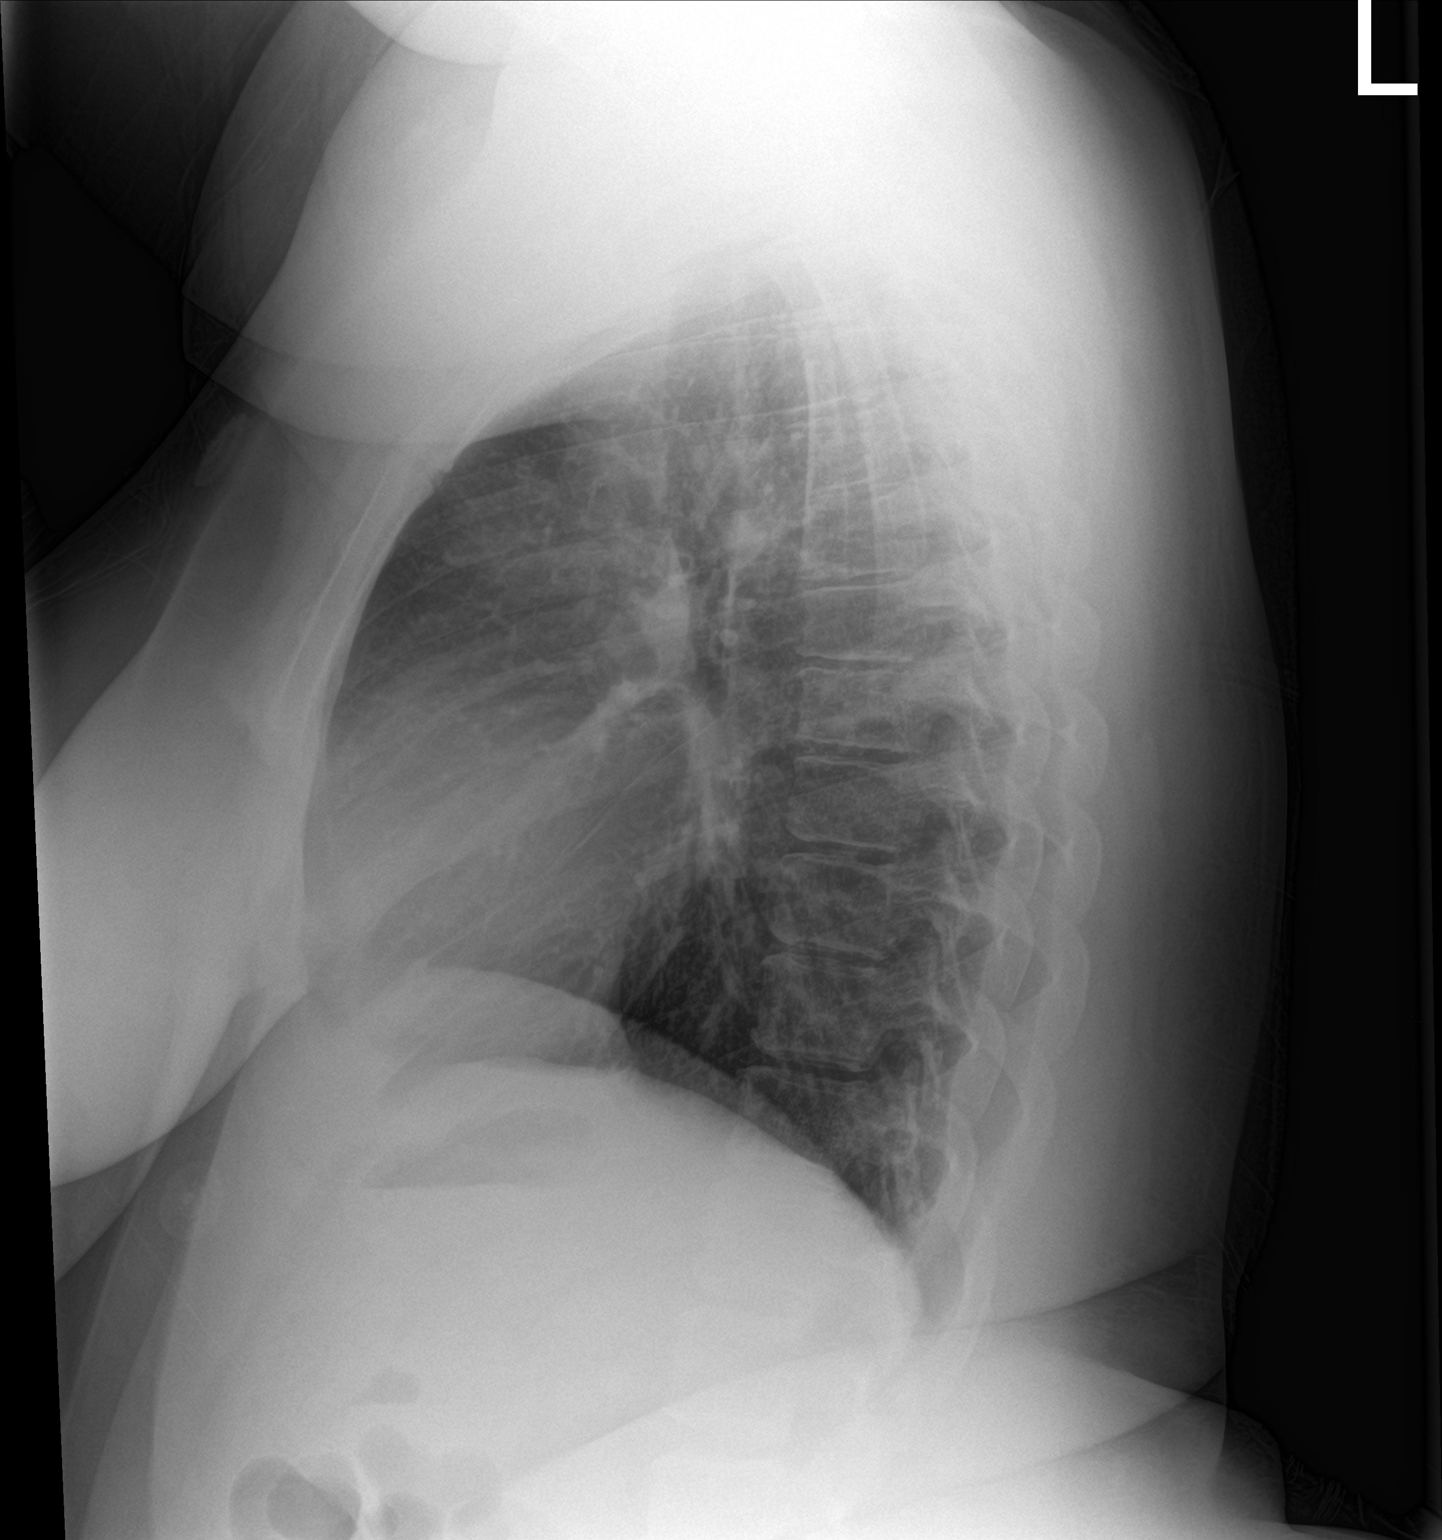

[2 of 2 positions shown; findings below may reference images not displayed]

FINDINGS: Heart size is normal. Mediastinal shadows are normal. The lungs are
clear. No bronchial thickening. No infiltrate, mass, effusion or
collapse. Pulmonary vascularity is normal. No bony abnormality.
IMPRESSION: Normal chest

## 2017-09-23 ENCOUNTER — Encounter (HOSPITAL_COMMUNITY): Payer: Self-pay | Admitting: Emergency Medicine

## 2017-09-23 ENCOUNTER — Ambulatory Visit (HOSPITAL_COMMUNITY)
Admission: EM | Admit: 2017-09-23 | Discharge: 2017-09-23 | Disposition: A | Discharge status: Home / Self Care | Payer: Medicaid Other | Attending: Family Medicine | Admitting: Family Medicine

## 2017-09-23 DIAGNOSIS — I10 Essential (primary) hypertension: Secondary | ICD-10-CM

## 2017-09-23 DIAGNOSIS — F419 Anxiety disorder, unspecified: Secondary | ICD-10-CM

## 2017-09-23 DIAGNOSIS — Z760 Encounter for issue of repeat prescription: Secondary | ICD-10-CM | POA: Diagnosis not present

## 2017-09-23 MED ORDER — FERROUS SULFATE 325 (65 FE) MG PO TABS: 325.0000 mg | ORAL_TABLET | Freq: Every day | ORAL | 1 refills | Status: DC

## 2017-09-23 MED ORDER — NORGESTIMATE-ETH ESTRADIOL 0.25-35 MG-MCG PO TABS: 1.0000 | ORAL_TABLET | Freq: Every day | ORAL | 1 refills | Status: DC

## 2017-09-23 MED ORDER — IBUPROFEN 800 MG PO TABS: 800.0000 mg | ORAL_TABLET | Freq: Three times a day (TID) | ORAL | 0 refills | Status: DC | PRN

## 2017-09-23 MED ORDER — MECLIZINE HCL 25 MG PO TABS: 25.0000 mg | ORAL_TABLET | Freq: Three times a day (TID) | ORAL | 1 refills | Status: DC | PRN

## 2017-09-23 MED ORDER — HYDROCHLOROTHIAZIDE 25 MG PO TABS: 25.0000 mg | ORAL_TABLET | Freq: Every day | ORAL | 1 refills | Status: DC

## 2017-09-23 NOTE — ED Triage Notes (Signed)
PT requests multiple med refills. PT has been out of meds for 1 week.

## 2017-09-30 NOTE — ED Provider Notes (Signed)
[facility]   [id number] 09/23/17 Arrival Time: 1655  ASSESSMENT & PLAN:  1. Medication refill     Meds ordered this encounter  Medications  . ferrous sulfate 325 (65 FE) MG tablet    Sig: Take 1 tablet (325 mg total) daily by mouth.    Dispense:  30 tablet    Refill:  1  . hydrochlorothiazide (HYDRODIURIL) 25 MG tablet    Sig: Take 1 tablet (25 mg total) daily by mouth.    Dispense:  30 tablet    Refill:  1  . ibuprofen (ADVIL,MOTRIN) 800 MG tablet    Sig: Take 1 tablet (800 mg total) every 8 (eight) hours as needed by mouth.    Dispense:  21 tablet    Refill:  0  . meclizine (ANTIVERT) 25 MG tablet    Sig: Take 1 tablet (25 mg total) every 8 (eight) hours as needed by mouth for dizziness.    Dispense:  30 tablet    Refill:  1  . norgestimate-ethinyl estradiol (ORTHO-CYCLEN,SPRINTEC,PREVIFEM) 0.25-35 MG-MCG tablet    Sig: Take 1 tablet daily by mouth.    Dispense:  1 Package    Refill:  1   Medications refilled as requested. To establish care with PCP.  Reviewed expectations re: course of current medical issues. Questions answered. Outlined signs and symptoms indicating need for more acute intervention. Patient verbalized understanding. After Visit Summary given.   SUBJECTIVE:  Diamond Cook is a 31 y.o. female who presents with request for medication refill. Has been out for one week. Otherwise well with no concerns. Is planning to est with PCP asap.  ROS: As per HPI.   OBJECTIVE:  Vitals:   09/23/17 1712 09/23/17 1713  BP:  103/81  Pulse:  94  Resp:  16  Temp:  98.7 F (37.1 C)  TempSrc:  Oral  SpO2:  100%  Weight: (!) 312 lb (141.5 kg)   Height: 5\' 7"  (1.702 m)     General appearance: alert; no distress Lungs: clear to auscultation bilaterally Heart: regular rate and rhythm Abdomen: soft, non-tender Extremities: no cyanosis or edema; symmetrical with no gross deformities Skin: warm and dry Psychological: alert and cooperative; normal mood  and affect   No Known Allergies  Past Medical History:  Diagnosis Date  . Anemia   . Anxiety   . Hypertension   . Obesity   . Vertigo    Social History   Socioeconomic History  . Marital status: Married    Spouse name: Not on file  . Number of children: Not on file  . Years of education: Not on file  . Highest education level: Not on file  Social Needs  . Financial resource strain: Not on file  . Food insecurity - worry: Not on file  . Food insecurity - inability: Not on file  . Transportation needs - medical: Not on file  . Transportation needs - non-medical: Not on file  Occupational History  . Not on file  Tobacco Use  . Smoking status: Never Smoker  . Smokeless tobacco: Never Used  Substance and Sexual Activity  . Alcohol use: No  . Drug use: No  . Sexual activity: Not on file  Other Topics Concern  . Not on file  Social History Narrative  . Not on file   No family history on file. Past Surgical History:  Procedure Laterality Date  . CESAREAN SECTION    . TUBAL LIGATION       Avalie Oconnor,  Arlys JohnBrian, MD 09/30/17 [phone]

## 2017-10-18 ENCOUNTER — Encounter (HOSPITAL_COMMUNITY): Payer: Self-pay

## 2017-10-18 ENCOUNTER — Emergency Department (HOSPITAL_COMMUNITY): Payer: Medicaid Other

## 2017-10-18 ENCOUNTER — Emergency Department (HOSPITAL_COMMUNITY)
Admission: EM | Admit: 2017-10-18 | Discharge: 2017-10-18 | Disposition: A | Discharge status: Home / Self Care | Payer: Medicaid Other | Attending: Emergency Medicine | Admitting: Emergency Medicine

## 2017-10-18 DIAGNOSIS — Z79899 Other long term (current) drug therapy: Secondary | ICD-10-CM | POA: Insufficient documentation

## 2017-10-18 DIAGNOSIS — R102 Pelvic and perineal pain: Secondary | ICD-10-CM | POA: Diagnosis present

## 2017-10-18 DIAGNOSIS — N939 Abnormal uterine and vaginal bleeding, unspecified: Secondary | ICD-10-CM | POA: Insufficient documentation

## 2017-10-18 DIAGNOSIS — I10 Essential (primary) hypertension: Secondary | ICD-10-CM | POA: Diagnosis not present

## 2017-10-18 LAB — URINALYSIS, ROUTINE W REFLEX MICROSCOPIC
BACTERIA UA: NONE SEEN
Bilirubin Urine: NEGATIVE
Glucose, UA: NEGATIVE mg/dL
Ketones, ur: NEGATIVE mg/dL
LEUKOCYTES UA: NEGATIVE
Nitrite: NEGATIVE
Protein, ur: NEGATIVE mg/dL
SPECIFIC GRAVITY, URINE: 1.02 (ref 1.005–1.030)
pH: 7 (ref 5.0–8.0)

## 2017-10-18 LAB — COMPREHENSIVE METABOLIC PANEL
ALT: 13 U/L — ABNORMAL LOW (ref 14–54)
AST: 17 U/L (ref 15–41)
Albumin: 3.1 g/dL — ABNORMAL LOW (ref 3.5–5.0)
Alkaline Phosphatase: 78 U/L (ref 38–126)
Anion gap: 7 (ref 5–15)
BUN: 11 mg/dL (ref 6–20)
CHLORIDE: 105 mmol/L (ref 101–111)
CO2: 24 mmol/L (ref 22–32)
Calcium: 8.7 mg/dL — ABNORMAL LOW (ref 8.9–10.3)
Creatinine, Ser: 0.94 mg/dL (ref 0.44–1.00)
Glucose, Bld: 115 mg/dL — ABNORMAL HIGH (ref 65–99)
POTASSIUM: 3.8 mmol/L (ref 3.5–5.1)
Sodium: 136 mmol/L (ref 135–145)
TOTAL PROTEIN: 7.2 g/dL (ref 6.5–8.1)
Total Bilirubin: 0.4 mg/dL (ref 0.3–1.2)

## 2017-10-18 LAB — WET PREP, GENITAL
SPERM: NONE SEEN
Trich, Wet Prep: NONE SEEN
Yeast Wet Prep HPF POC: NONE SEEN

## 2017-10-18 LAB — LIPASE, BLOOD: LIPASE: 18 U/L (ref 11–51)

## 2017-10-18 LAB — CBC
HEMATOCRIT: 36.1 % (ref 36.0–46.0)
Hemoglobin: 11.3 g/dL — ABNORMAL LOW (ref 12.0–15.0)
MCH: 24.7 pg — ABNORMAL LOW (ref 26.0–34.0)
MCHC: 31.3 g/dL (ref 30.0–36.0)
MCV: 78.8 fL (ref 78.0–100.0)
PLATELETS: 250 10*3/uL (ref 150–400)
RBC: 4.58 MIL/uL (ref 3.87–5.11)
RDW: 15.2 % (ref 11.5–15.5)
WBC: 8.4 10*3/uL (ref 4.0–10.5)

## 2017-10-18 LAB — I-STAT BETA HCG BLOOD, ED (MC, WL, AP ONLY)

## 2017-10-18 MED ORDER — MEGESTROL ACETATE 40 MG PO TABS: 80.0000 mg | ORAL_TABLET | Freq: Two times a day (BID) | ORAL | 0 refills | Status: DC

## 2017-10-18 MED ORDER — KETOROLAC TROMETHAMINE 60 MG/2ML IM SOLN
30.0000 mg | Freq: Once | INTRAMUSCULAR | Status: AC
  Administered 2017-10-18: 30 mg via INTRAMUSCULAR
  Filled 2017-10-18: qty 2

## 2017-10-18 MED ORDER — NAPROXEN SODIUM 550 MG PO TABS: 550.0000 mg | ORAL_TABLET | Freq: Two times a day (BID) | ORAL | 0 refills | Status: DC

## 2017-10-18 NOTE — ED Notes (Signed)
Pt in ultrasound

## 2017-10-18 NOTE — ED Triage Notes (Signed)
Patient complains of pelvic pain with vaginal bleeding intermittently x 3 weeks. Patient states no relief with otc meds, alert and oriented

## 2017-10-18 NOTE — Discharge Instructions (Signed)
Someone will call you from the GYN to schedule an [facility]. I am giving you information on Endometrial biopsy because you may need to have one and I want you to have the information. Stop the Sprintec and start the Megace. If you have worsening symptoms before your follow up appointment go to [facility] ED, called Maternity Admissions for further evaluation.

## 2017-10-18 NOTE — ED Provider Notes (Signed)
[facility] Provider Note   CSN: [id number] Arrival date & time: 10/18/17  1626     History   Chief Complaint Chief Complaint  Patient presents with  . Pelvic Pain/bleeding    HPI Diamond Cook is a 31 y.o. female who presents to the ED with vaginal bleeding. The bleeding has been intermittent for the past 3 weeks. LNMP 07/2017. Patient taking OC's to help with bleeding. She has had BTL for birth control. Hx of trichomonis years ago and Chlamydia. Sexually active with one female partner x 8 years. Patient has not had GYN visit in over a year. She reports getting OC's for bleeding in the ED.  The history is provided by the patient. No language interpreter was used.  Vaginal Bleeding  Primary symptoms include discharge, pelvic pain, vaginal bleeding.  Primary symptoms include no dyspareunia, no genital lesions, no genital rash, no genital itching, no genital odor, no dysuria. There has been no fever. This is a new problem. The current episode started more than 1 week ago. The problem has been gradually worsening. She is not pregnant. The discharge was bloody. Associated symptoms include abdominal pain and nausea. Pertinent negatives include no constipation, no diarrhea, no vomiting, no frequency, no light-headedness and no dizziness. She has tried NSAIDs for the symptoms. Sexual activity: sexually active. Associated medical issues include STD and Cesarean section (x3). Associated medical issues do not include ectopic pregnancy.    Past Medical History:  Diagnosis Date  . Anemia   . Anxiety   . Hypertension   . Obesity   . Vertigo     There are no active problems to display for this patient.   Past Surgical History:  Procedure Laterality Date  . CESAREAN SECTION    . TUBAL LIGATION      OB History    Gravida Para Term Preterm AB Living   3 3 3     3    SAB TAB Ectopic Multiple Live Births           3       Home Medications    Prior to  Admission medications   Medication Sig Start Date End Date Taking? Authorizing Provider  cetirizine (ZYRTEC ALLERGY) 10 MG tablet Take 1 tablet (10 mg total) by mouth daily. Patient not taking: Reported on 09/23/2016 09/06/16   Roxy HorsemanBrowning, Robert, PA-C  ferrous sulfate 325 (65 FE) MG tablet Take 1 tablet (325 mg total) daily by mouth. 09/23/17   Mardella LaymanHagler, Brian, MD  hydrochlorothiazide (HYDRODIURIL) 25 MG tablet Take 1 tablet (25 mg total) daily by mouth. 09/23/17   Mardella LaymanHagler, Brian, MD  ibuprofen (ADVIL,MOTRIN) 800 MG tablet Take 1 tablet (800 mg total) every 8 (eight) hours as needed by mouth. 09/23/17   Mardella LaymanHagler, Brian, MD  meclizine (ANTIVERT) 25 MG tablet Take 1 tablet (25 mg total) every 8 (eight) hours as needed by mouth for dizziness. 09/23/17   Mardella LaymanHagler, Brian, MD  megestrol (MEGACE) 40 MG tablet Take 2 tablets (80 mg total) by mouth 2 (two) times daily. 10/18/17   Janne NapoleonNeese, Bryanne Riquelme M, NP  naproxen (NAPROSYN) 500 MG tablet Take 1 tablet (500 mg total) by mouth 2 (two) times daily. Patient not taking: Reported on 02/13/2017 09/23/16   Renne CriglerGeiple, Joshua, PA-C  naproxen sodium (ANAPROX DS) 550 MG tablet Take 1 tablet (550 mg total) by mouth 2 (two) times daily with a meal. 10/18/17   Janne NapoleonNeese, Okie Bogacz M, NP  norgestimate-ethinyl estradiol (ORTHO-CYCLEN,SPRINTEC,PREVIFEM) 0.25-35 MG-MCG tablet Take 1  tablet daily by mouth. 09/23/17   Mardella LaymanHagler, Brian, MD  promethazine (PHENERGAN) 25 MG tablet Take 0.5-1 tablets (12.5-25 mg total) by mouth every 6 (six) hours as needed for nausea. Patient not taking: Reported on 05/30/2017 08/01/16   Hurshel PartyLeftwich-Kirby, Lisa A, CNM    Family History No family history on file.  Social History Social History   Tobacco Use  . Smoking status: Never Smoker  . Smokeless tobacco: Never Used  Substance Use Topics  . Alcohol use: No  . Drug use: No     Allergies   Patient has no known allergies.   Review of Systems Review of Systems  Constitutional: Positive for chills. Negative for fever.    HENT: Negative.   Eyes: Negative for visual disturbance.  Respiratory: Negative for cough and shortness of breath.   Cardiovascular: Negative for chest pain.  Gastrointestinal: Positive for abdominal pain and nausea. Negative for constipation, diarrhea and vomiting.  Genitourinary: Positive for pelvic pain and vaginal bleeding. Negative for dyspareunia, dysuria and frequency.  Musculoskeletal: Positive for back pain.  Skin: Negative for rash.  Neurological: Negative for dizziness and light-headedness.  Psychiatric/Behavioral: Negative for confusion.     Physical Exam Updated Vital Signs BP 137/81   Pulse 88   Temp 98.6 F (37 C) (Oral)   Resp 20   Ht 5\' 7"  (1.702 m)   Wt (!) 142 kg (313 lb)   SpO2 99%   BMI 49.02 kg/m   Physical Exam  Constitutional: No distress.  Morbidly obese  HENT:  Head: Normocephalic and atraumatic.  Eyes: EOM are normal.  Neck: Neck supple.  Cardiovascular: Normal rate and regular rhythm.  Pulmonary/Chest: Effort normal and breath sounds normal.  Abdominal: Soft. Bowel sounds are normal. There is tenderness.  Tender with palpation lower abdomen. No guarding, no rebound.   Genitourinary:  Genitourinary Comments: External genitalia without lesions, moderate blood vaginal vault. Positive CMT, bilateral adnexal tenderness, unable to palpate uterus due to patient habitus.   Musculoskeletal: Normal range of motion.  Neurological: She is alert.  Skin: Skin is warm and dry.  Psychiatric: She has a normal mood and affect. Her behavior is normal.  Nursing note and vitals reviewed.    ED Treatments / Results  Labs (all labs ordered are listed, but only abnormal results are displayed) Labs Reviewed  WET PREP, GENITAL - Abnormal; Notable for the following components:      Result Value   Clue Cells Wet Prep HPF POC PRESENT (*)    WBC, Wet Prep HPF POC PRESENT (*)    All other components within normal limits  COMPREHENSIVE METABOLIC PANEL - Abnormal;  Notable for the following components:   Glucose, Bld 115 (*)    Calcium 8.7 (*)    Albumin 3.1 (*)    ALT 13 (*)    All other components within normal limits  CBC - Abnormal; Notable for the following components:   Hemoglobin 11.3 (*)    MCH 24.7 (*)    All other components within normal limits  URINALYSIS, ROUTINE W REFLEX MICROSCOPIC - Abnormal; Notable for the following components:   Hgb urine dipstick LARGE (*)    Squamous Epithelial / LPF 0-5 (*)    Non Squamous Epithelial 0-5 (*)    All other components within normal limits  LIPASE, BLOOD  I-STAT BETA HCG BLOOD, ED ([facility], [facility], [facility] ONLY)  GC/CHLAMYDIA PROBE AMP ([place]) NOT AT [facility]   Radiology [place] Pelvic Complete W Transvaginal And Torsion R/o  Result  Date: 10/18/2017 CLINICAL DATA:  Pelvic pain. Vaginal bleeding intermittently for 3 weeks. History of endometrial ablation. EXAM: TRANSABDOMINAL AND TRANSVAGINAL ULTRASOUND OF PELVIS TECHNIQUE: Both transabdominal and transvaginal ultrasound examinations of the pelvis were performed. Transabdominal technique was performed for global imaging of the pelvis including uterus, ovaries, adnexal regions, and pelvic cul-de-sac. It was necessary to proceed with endovaginal exam following the transabdominal exam to visualize the endometrium and ovaries. COMPARISON:  None FINDINGS: Uterus Measurements: 11 x 6.5 x 6.6 cm. No fibroids or other mass visualized. Endometrium Thickness: 21 mm.  No focal abnormality visualized. Right ovary Measurements: 2.3 x 1.6 x 2.1 cm. 1.4 cm corpus luteum cyst. Left ovary Measurements: 2.9 x 1.9 x 2.3 cm. Normal appearance/no adnexal mass. Other findings A small amount of fluid in the cul-de-sac is likely physiologic. Arterial and venous blood flow seen in both ovaries. IMPRESSION: 1. The endometrium is thickened to 2.1 cm which is abnormal in the setting of dysfunctional uterine bleeding. Recommend gynecologic consultation and consider endometrial biopsy. Electronically  Signed   By: Gerome Sam III M.D   On: 10/18/2017 21:39    Procedures Procedures (including critical care time)  Medications Ordered in ED Medications  ketorolac (TORADOL) injection 30 mg (30 mg Intramuscular Given 10/18/17 2023)   9:50 pm Consult with Dr. Macon Large and will stop OC's and start Megace 80 mg BID until patient can be seen in GYN Clinic for endometrial biopsy.   Initial Impression / Assessment and Plan / ED Course  I have reviewed the triage vital signs and the nursing notes.  Final Clinical Impressions(s) / ED Diagnoses  31 y.o. female with pelvic pain and DUB stable for discharge without hemorrhage and Hgb stable. Discussed with the patient f/u plan and that the GYN office will call to schedule an appointment. Discussed possible endometrial biopsy and need for f/u. Patient will stop OC's and start Megace. She will go to [facility] for further evaluation if symptoms worsen before her GYN appointment.  Final diagnoses:  Vaginal bleeding  Pelvic pain  Abnormal uterine bleeding    ED Discharge Orders        Ordered    naproxen sodium (ANAPROX DS) 550 MG tablet  2 times daily with meals     10/18/17 2159    megestrol (MEGACE) 40 MG tablet  2 times daily     10/18/17 2159       Kerrie Buffalo Viola, NP 10/19/17 0159    Maia Plan, MD 10/19/17 1002

## 2017-10-20 LAB — GC/CHLAMYDIA PROBE AMP (~~LOC~~) NOT AT ARMC
Chlamydia: NEGATIVE
Neisseria Gonorrhea: NEGATIVE

## 2017-11-24 ENCOUNTER — Encounter: Payer: Medicaid Other | Admitting: Obstetrics and Gynecology

## 2017-12-08 ENCOUNTER — Encounter (HOSPITAL_COMMUNITY): Payer: Self-pay | Admitting: Emergency Medicine

## 2017-12-08 ENCOUNTER — Emergency Department (HOSPITAL_COMMUNITY): Payer: Medicaid Other

## 2017-12-08 ENCOUNTER — Emergency Department (HOSPITAL_COMMUNITY)
Admission: EM | Admit: 2017-12-08 | Discharge: 2017-12-08 | Disposition: A | Discharge status: Home / Self Care | Payer: Medicaid Other | Attending: Emergency Medicine | Admitting: Emergency Medicine

## 2017-12-08 DIAGNOSIS — I10 Essential (primary) hypertension: Secondary | ICD-10-CM | POA: Diagnosis not present

## 2017-12-08 DIAGNOSIS — Z79899 Other long term (current) drug therapy: Secondary | ICD-10-CM | POA: Diagnosis not present

## 2017-12-08 DIAGNOSIS — R002 Palpitations: Secondary | ICD-10-CM | POA: Diagnosis not present

## 2017-12-08 LAB — BASIC METABOLIC PANEL
Anion gap: 13 (ref 5–15)
BUN: 12 mg/dL (ref 6–20)
CHLORIDE: 106 mmol/L (ref 101–111)
CO2: 21 mmol/L — AB (ref 22–32)
Calcium: 9.1 mg/dL (ref 8.9–10.3)
Creatinine, Ser: 1.11 mg/dL — ABNORMAL HIGH (ref 0.44–1.00)
GFR calc Af Amer: >60 mL/min (ref >60)
GFR calc non Af Amer: >60 mL/min (ref >60)
GLUCOSE: 102 mg/dL — AB (ref 65–99)
POTASSIUM: 3.5 mmol/L (ref 3.5–5.1)
SODIUM: 140 mmol/L (ref 135–145)

## 2017-12-08 LAB — CBC
HEMATOCRIT: 35.1 % — AB (ref 36.0–46.0)
Hemoglobin: 11.4 g/dL — ABNORMAL LOW (ref 12.0–15.0)
MCH: 24.9 pg — ABNORMAL LOW (ref 26.0–34.0)
MCHC: 32.5 g/dL (ref 30.0–36.0)
MCV: 76.6 fL — AB (ref 78.0–100.0)
Platelets: 243 10*3/uL (ref 150–400)
RBC: 4.58 MIL/uL (ref 3.87–5.11)
RDW: 15.7 % — AB (ref 11.5–15.5)
WBC: 6.6 10*3/uL (ref 4.0–10.5)

## 2017-12-08 LAB — I-STAT TROPONIN, ED: Troponin i, poc: 0 ng/mL (ref 0.00–0.08)

## 2017-12-08 LAB — I-STAT BETA HCG BLOOD, ED (MC, WL, AP ONLY)

## 2017-12-08 NOTE — ED Notes (Signed)
Pt verbalizes understanding of d/c instructions. Pt received prescriptions. Pt ambulatory at d/c with all belongings.  

## 2017-12-08 NOTE — ED Provider Notes (Signed)
[facility] EMERGENCY DEPARTMENT Provider Note   CSN: [id number] Arrival date & time: 12/08/17  0047     History   Chief Complaint Chief Complaint  Patient presents with  . Palpitations    HPI Diamond Cook is a 32 y.o. female.  Patient woke tonight when her husband got home from work around 11 pm and shortly afterward started to feel like her heart was racing. No SoB or chest pain. The symptoms lasted less than one hour and have not recurred. She did not take anything at home to relieve symptoms.  She denies recent illness, cough, fever, URI symptoms. No nausea or vomiting.    The history is provided by the patient. No language interpreter was used.    Past Medical History:  Diagnosis Date  . Anemia   . Anxiety   . Hypertension   . Obesity   . Vertigo     There are no active problems to display for this patient.   Past Surgical History:  Procedure Laterality Date  . CESAREAN SECTION    . TUBAL LIGATION      OB History    Gravida Para Term Preterm AB Living   3 3 3     3    SAB TAB Ectopic Multiple Live Births           3       Home Medications    Prior to Admission medications   Medication Sig Start Date End Date Taking? Authorizing Provider  cetirizine (ZYRTEC ALLERGY) 10 MG tablet Take 1 tablet (10 mg total) by mouth daily. Patient not taking: Reported on 09/23/2016 09/06/16   Roxy Horseman, PA-C  ferrous sulfate 325 (65 FE) MG tablet Take 1 tablet (325 mg total) daily by mouth. 09/23/17   Mardella Layman, MD  hydrochlorothiazide (HYDRODIURIL) 25 MG tablet Take 1 tablet (25 mg total) daily by mouth. 09/23/17   Mardella Layman, MD  ibuprofen (ADVIL,MOTRIN) 800 MG tablet Take 1 tablet (800 mg total) every 8 (eight) hours as needed by mouth. 09/23/17   Mardella Layman, MD  meclizine (ANTIVERT) 25 MG tablet Take 1 tablet (25 mg total) every 8 (eight) hours as needed by mouth for dizziness. 09/23/17   Mardella Layman, MD  megestrol (MEGACE) 40 MG tablet  Take 2 tablets (80 mg total) by mouth 2 (two) times daily. 10/18/17   Janne Napoleon, NP  naproxen (NAPROSYN) 500 MG tablet Take 1 tablet (500 mg total) by mouth 2 (two) times daily. Patient not taking: Reported on 02/13/2017 09/23/16   Renne Crigler, PA-C  naproxen sodium (ANAPROX DS) 550 MG tablet Take 1 tablet (550 mg total) by mouth 2 (two) times daily with a meal. 10/18/17   Janne Napoleon, NP  norgestimate-ethinyl estradiol (ORTHO-CYCLEN,SPRINTEC,PREVIFEM) 0.25-35 MG-MCG tablet Take 1 tablet daily by mouth. 09/23/17   Mardella Layman, MD  promethazine (PHENERGAN) 25 MG tablet Take 0.5-1 tablets (12.5-25 mg total) by mouth every 6 (six) hours as needed for nausea. Patient not taking: Reported on 05/30/2017 08/01/16   Hurshel Party, CNM    Family History History reviewed. No pertinent family history.  Social History Social History   Tobacco Use  . Smoking status: Never Smoker  . Smokeless tobacco: Never Used  Substance Use Topics  . Alcohol use: No  . Drug use: No     Allergies   Patient has no known allergies.   Review of Systems Review of Systems  Constitutional: Negative for chills, diaphoresis and fever.  Respiratory: Negative.  Negative for cough and shortness of breath.   Cardiovascular: Positive for palpitations. Negative for chest pain and leg swelling.  Gastrointestinal: Negative.  Negative for abdominal pain, nausea and vomiting.  Musculoskeletal: Negative.   Skin: Negative.   Neurological: Negative.      Physical Exam Updated Vital Signs BP 122/77 (BP Location: Right Arm)   Pulse 83   Temp 98.4 F (36.9 C) (Oral)   Resp 19   Ht 5\' 7"  (1.702 m)   Wt (!) 139.7 kg (308 lb)   LMP  (LMP Unknown)   SpO2 100%   BMI 48.24 kg/m   Physical Exam  Constitutional: She is oriented to person, place, and time. She appears well-developed and well-nourished.  HENT:  Head: Normocephalic.  Neck: Normal range of motion. Neck supple.  Cardiovascular: Normal rate and  regular rhythm.  No murmur heard. Pulmonary/Chest: Effort normal and breath sounds normal. She has no wheezes. She has no rales. She exhibits no tenderness.  Abdominal: Soft. Bowel sounds are normal. There is no tenderness. There is no rebound and no guarding.  Musculoskeletal: Normal range of motion. She exhibits no edema.  Neurological: She is alert and oriented to person, place, and time.  Skin: Skin is warm and dry. No rash noted.  Psychiatric: She has a normal mood and affect.     ED Treatments / Results  Labs (all labs ordered are listed, but only abnormal results are displayed) Labs Reviewed  BASIC METABOLIC PANEL - Abnormal; Notable for the following components:      Result Value   CO2 21 (*)    Glucose, Bld 102 (*)    Creatinine, Ser 1.11 (*)    All other components within normal limits  CBC - Abnormal; Notable for the following components:   Hemoglobin 11.4 (*)    HCT 35.1 (*)    MCV 76.6 (*)    MCH 24.9 (*)    RDW 15.7 (*)    All other components within normal limits  I-STAT TROPONIN, ED  I-STAT BETA HCG BLOOD, ED ([facility], [facility], [facility] ONLY)    EKG  EKG Interpretation  Date/Time:  Tuesday December 08 2017 00:51:59 EST Ventricular Rate:  96 PR Interval:  156 QRS Duration: 90 QT Interval:  364 QTC Calculation: 459 R Axis:   84 Text Interpretation:  Normal sinus rhythm Normal ECG No significant change since last tracing 05 Aug 2017 Confirmed by Devoria Albe ([id number]) on 12/08/2017 2:45:14 AM       Radiology Dg Chest 2 View  Result Date: 12/08/2017 CLINICAL DATA:  Palpitation EXAM: CHEST  2 VIEW COMPARISON:  08/05/2017 FINDINGS: Mild bronchitic changes. No acute consolidation or effusion. Normal cardiomediastinal silhouette. No pneumothorax. IMPRESSION: No active cardiopulmonary disease.  Mild bronchitic changes Electronically Signed   By: Jasmine Pang M.D.   On: 12/08/2017 01:23    Procedures Procedures (including critical care time)  Medications Ordered in  ED Medications - No data to display   Initial Impression / Assessment and Plan / ED Course  I have reviewed the triage vital signs and the nursing notes.  Pertinent labs & imaging results that were available during my care of the patient were reviewed by me and considered in my medical decision making (see chart for details).     Patient presents for evaluation of racing heart beat without pain or SoB. Symptoms resolved over a 15-20 minute period and have not returned. She reports she has had anxiety in the past and symptoms  were vaguely similar.   EKG NSR. Labs are unremarkable. She has no indication for PE by Well's criteria for PE. Patient is asymptomatic. She can be discharged home and is encouraged to see her doctor for recheck if symptoms recur.  Final Clinical Impressions(s) / ED Diagnoses   Final diagnoses:  None   1. Palpitations  ED Discharge Orders    None       Elpidio AnisUpstill, Galilea Quito, PA-C 12/08/17 0424    Elpidio AnisUpstill, Latonya Nelon, PA-C 12/08/17 [id number]    Devoria AlbeKnapp, Iva, MD 12/08/17 [phone]

## 2017-12-08 NOTE — ED Triage Notes (Signed)
Pt reports sudden onset of the feeling like her heart is racing starting about 15 minutes ago. Pt reports started while she was laying down. Denies chest pain and shortness of breath.

## 2017-12-08 NOTE — Discharge Instructions (Signed)
Your tests here are normal. You can be discharged home and should see your doctor for recheck. Return here with any worsening symptoms - shortness of breath, chest pain, palpitations (heart racing) for longer periods, or new concern.

## 2017-12-17 ENCOUNTER — Encounter: Payer: Self-pay | Admitting: Family Medicine

## 2017-12-17 ENCOUNTER — Ambulatory Visit (INDEPENDENT_AMBULATORY_CARE_PROVIDER_SITE_OTHER): Payer: Medicaid Other | Admitting: Family Medicine

## 2017-12-17 ENCOUNTER — Encounter (HOSPITAL_COMMUNITY): Payer: Self-pay

## 2017-12-17 ENCOUNTER — Other Ambulatory Visit (HOSPITAL_COMMUNITY)
Admission: RE | Admit: 2017-12-17 | Discharge: 2017-12-17 | Disposition: A | Discharge status: Home / Self Care | Payer: Medicaid Other | Source: Ambulatory Visit | Attending: Family Medicine | Admitting: Family Medicine

## 2017-12-17 VITALS — BP 139/85 | HR 83 | Wt 304.0 lb

## 2017-12-17 DIAGNOSIS — Z01419 Encounter for gynecological examination (general) (routine) without abnormal findings: Secondary | ICD-10-CM

## 2017-12-17 DIAGNOSIS — N921 Excessive and frequent menstruation with irregular cycle: Secondary | ICD-10-CM | POA: Insufficient documentation

## 2017-12-17 DIAGNOSIS — D509 Iron deficiency anemia, unspecified: Secondary | ICD-10-CM | POA: Insufficient documentation

## 2017-12-17 DIAGNOSIS — Z6841 Body Mass Index (BMI) 40.0 and over, adult: Secondary | ICD-10-CM | POA: Insufficient documentation

## 2017-12-17 DIAGNOSIS — D500 Iron deficiency anemia secondary to blood loss (chronic): Secondary | ICD-10-CM | POA: Diagnosis not present

## 2017-12-17 DIAGNOSIS — Z124 Encounter for screening for malignant neoplasm of cervix: Secondary | ICD-10-CM

## 2017-12-17 DIAGNOSIS — I10 Essential (primary) hypertension: Secondary | ICD-10-CM | POA: Insufficient documentation

## 2017-12-17 DIAGNOSIS — Z1151 Encounter for screening for human papillomavirus (HPV): Secondary | ICD-10-CM | POA: Insufficient documentation

## 2017-12-17 LAB — POCT PREGNANCY, URINE: Preg Test, Ur: NEGATIVE

## 2017-12-17 MED ORDER — IBUPROFEN 800 MG PO TABS: 800.0000 mg | ORAL_TABLET | Freq: Three times a day (TID) | ORAL | 0 refills | Status: DC | PRN

## 2017-12-17 MED ORDER — MEGESTROL ACETATE 40 MG PO TABS: 80.0000 mg | ORAL_TABLET | Freq: Two times a day (BID) | ORAL | 0 refills | Status: DC

## 2017-12-17 MED ORDER — MISOPROSTOL 200 MCG PO TABS: ORAL_TABLET | ORAL | 0 refills | Status: DC

## 2017-12-17 MED ORDER — HYDROCHLOROTHIAZIDE 25 MG PO TABS: 25.0000 mg | ORAL_TABLET | Freq: Every day | ORAL | 1 refills | Status: DC

## 2017-12-17 NOTE — Assessment & Plan Note (Addendum)
Given BTL and Novasure, and C-section x 3 and morbid obesity, would consider IUD better option than hyst. We can perform D and C with endometrial sampling and IUD placement under anesthesia, which she consents to. Risks include but are not limited to bleeding, infection, injury to surrounding structures, including bowel, bladder and ureters, blood clots, and death.  Likelihood of success is high. Will continue Megace until surgery is complete. Give cytotec to dilate cervix. Check TSH.

## 2017-12-17 NOTE — Progress Notes (Signed)
   Subjective:    Patient ID: Diamond Cook is a 32 y.o. female presenting with Follow-up  on 12/17/2017  HPI: Menarche at age 32. Cycles noted to be q 8 days every 30-60 days. Noted to have cycles that were heavy and then underwent Novasure ablation. Ablation 2014. Began having painful cycles. Then put on OC's which helped to regulate cycles and to decrease pain and bleeding. Stopped OC's when in the hospital. Notes bleeding if has no medication on board and pain. Now bleeding all the time x 3 wks. OCs work better than megace but still bleeding.  [place]. Urgent care refilled meds. Ultrasound shows thickened endometrium. She is referred for EMB. Patient states prior to Novasure, MD tried IUD but could not place due to pain. After explaining the procedure and possible pain, she declined office EMB today.  Review of Systems  Constitutional: Negative for chills and fever.  Respiratory: Negative for shortness of breath.   Cardiovascular: Negative for chest pain.  Gastrointestinal: Positive for abdominal pain. Negative for nausea and vomiting.  Genitourinary: Positive for vaginal bleeding. Negative for dysuria.  Skin: Negative for rash.      Objective:    BP 139/85   Pulse 83   Wt (!) 304 lb (137.9 kg)   LMP  (LMP Unknown)   BMI 47.61 kg/m  Physical Exam  Constitutional: She is oriented to person, place, and time. She appears well-developed and well-nourished. No distress.  HENT:  Head: Normocephalic and atraumatic.  Eyes: No scleral icterus.  Neck: Neck supple.  Cardiovascular: Normal rate.  Pulmonary/Chest: Effort normal.  Abdominal: Soft.  Genitourinary:  Genitourinary Comments: Exam limited by body habitus. No lesion on cervix.  Neurological: She is alert and oriented to person, place, and time.  Skin: Skin is warm and dry.  Psychiatric: She has a normal mood and affect.        Assessment & Plan:   Problem List Items Addressed This Visit      Unprioritized   Menorrhagia with irregular cycle - Primary    Given BTL and Novasure, and C-section x 3 and morbid obesity, would consider IUD better option than hyst. We can perform D and C with endometrial sampling and IUD placement under anesthesia, which she consents to. Risks include but are not limited to bleeding, infection, injury to surrounding structures, including bowel, bladder and ureters, blood clots, and death.  Likelihood of success is high. Will continue Megace until surgery is complete. Give cytotec to dilate cervix. Check TSH.       Relevant Medications   ibuprofen (ADVIL,MOTRIN) 800 MG tablet   megestrol (MEGACE) 40 MG tablet   misoprostol (CYTOTEC) 200 MCG tablet   Other Relevant Orders   Pregnancy, urine POC (Completed)   TSH   Morbid obesity (HCC)   Iron deficiency anemia   Benign essential hypertension   Relevant Medications   hydrochlorothiazide (HYDRODIURIL) 25 MG tablet    Other Visit Diagnoses    Encounter for gynecological examination       Screening for cervical cancer       Relevant Orders   Cytology - PAP      Total face-to-face time with patient: 30 minutes. Over 50% of encounter was spent on counseling and coordination of care. Return in about 3 months (around 03/16/2018) for postop check.  Reva Boresanya S Motty Borin 12/17/2017 9:54 AM

## 2017-12-17 NOTE — Patient Instructions (Addendum)
Dilation and Curettage or Vacuum Curettage Dilation and curettage (D&C) and vacuum curettage are minor procedures. A D&C involves stretching (dilation) the cervix and scraping (curettage) the inside lining of the uterus (endometrium). During a D&C, tissue is gently scraped from the endometrium, starting from the top portion of the uterus down to the lowest part of the uterus (cervix). During a vacuum curettage, the lining and tissue in the uterus are removed with the use of gentle suction. Curettage may be performed to either diagnose or treat a problem. As a diagnostic procedure, curettage is performed to examine tissues from the uterus. A diagnostic curettage may be done if you have:  Irregular bleeding in the uterus.  Bleeding with the development of clots.  Spotting between menstrual periods.  Prolonged menstrual periods or other abnormal bleeding.  Bleeding after menopause.  No menstrual period (amenorrhea).  A change in size and shape of the uterus.  Abnormal endometrial cells discovered during a Pap test.  As a treatment procedure, curettage may be performed for the following reasons:  Removal of an IUD (intrauterine device).  Removal of retained placenta after giving birth.  Abortion.  Miscarriage.  Removal of endometrial polyps.  Removal of uncommon types of noncancerous lumps (fibroids).  Tell a health care provider about:  Any allergies you have, including allergies to prescribed medicine or latex.  All medicines you are taking, including vitamins, herbs, eye drops, creams, and over-the-counter medicines. This is especially important if you take any blood-thinning medicine. Bring a list of all of your medicines to your appointment.  Any problems you or family members have had with anesthetic medicines.  Any blood disorders you have.  Any surgeries you have had.  Your medical history and any medical conditions you have.  Whether you are pregnant or may be  pregnant.  Recent vaginal infections you have had.  Recent menstrual periods, bleeding problems you have had, and what form of birth control (contraception) you use. What are the risks? Generally, this is a safe procedure. However, problems may occur, including:  Infection.  Heavy vaginal bleeding.  Allergic reactions to medicines.  Damage to the cervix or other structures or organs.  Development of scar tissue (adhesions) inside the uterus, which can cause abnormal amounts of menstrual bleeding. This may make it harder to get pregnant in the future.  A hole (perforation) or puncture in the uterine wall. This is rare.  What happens before the procedure? Staying hydrated Follow instructions from your health care provider about hydration, which may include:  Up to 2 hours before the procedure - you may continue to drink clear liquids, such as water, clear fruit juice, black coffee, and plain tea.  Eating and drinking restrictions Follow instructions from your health care provider about eating and drinking, which may include:  8 hours before the procedure - stop eating heavy meals or foods such as meat, fried foods, or fatty foods.  6 hours before the procedure - stop eating light meals or foods, such as toast or cereal.  6 hours before the procedure - stop drinking milk or drinks that contain milk.  2 hours before the procedure - stop drinking clear liquids. If your health care provider told you to take your medicine(s) on the day of your procedure, take them with only a sip of water.  Medicines  Ask your health care provider about: ? Changing or stopping your regular medicines. This is especially important if you are taking diabetes medicines or blood thinners. ? Taking   medicines such as aspirin and ibuprofen. These medicines can thin your blood. Do not take these medicines before your procedure if your health care provider instructs you not to.  You may be given antibiotic  medicine to help prevent infection. General instructions  For 24 hours before your procedure, do not: ? Douche. ? Use tampons. ? Use medicines, creams, or suppositories in the vagina. ? Have sexual intercourse.  You may be given a pregnancy test on the day of the procedure.  Plan to have someone take you home from the hospital or clinic.  You may have a blood or urine sample taken.  If you will be going home right after the procedure, plan to have someone with you for 24 hours. What happens during the procedure?  To reduce your risk of infection: ? Your health care team will wash or sanitize their hands. ? Your skin will be washed with soap.  An IV tube will be inserted into one of your veins.  You will be given one of the following: ? A medicine that numbs the area in and around the cervix (local anesthetic). ? A medicine to make you fall asleep (general anesthetic).  You will lie down on your back, with your feet in foot rests (stirrups).  The size and position of your uterus will be checked.  A lubricated instrument (speculum or Sims retractor) will be inserted into the back side of your vagina. The speculum will be used to hold apart the walls of your vagina so your health care provider can see your cervix.  A tool (tenaculum) will be attached to the lip of the cervix to stabilize it.  Your cervix will be softened and dilated. This may be done by: ? Taking a medicine. ? Having tapered dilators or thin rods (laminaria) or gradual widening instruments (tapered dilators) inserted into your cervix.  A small, sharp, curved instrument (curette) will be used to scrape a small amount of tissue or cells from the endometrium or cervical canal. In some cases, gentle suction is applied with the curette. The curette will then be removed. The cells will be taken to a lab for testing. The procedure may vary among health care providers and hospitals. What happens after the  procedure?  You may have mild cramping, backache, pain, and light bleeding or spotting. You may pass small blood clots from your vagina.  You may have to wear compression stockings. These stockings help to prevent blood clots and reduce swelling in your legs.  Your blood pressure, heart rate, breathing rate, and blood oxygen level will be monitored until the medicines you were given have worn off. Summary  Dilation and curettage (D&C) involves stretching (dilation) the cervix and scraping (curettage) the inside lining of the uterus (endometrium).  After the procedure, you may have mild cramping, backache, pain, and light bleeding or spotting. You may pass small blood clots from your vagina.  Plan to have someone take you home from the hospital or clinic. This information is not intended to replace advice given to you by your health care provider. Make sure you discuss any questions you have with your health care provider. Document Released: 11/03/2005 Document Revised: 07/20/2016 Document Reviewed: 07/20/2016 Elsevier Interactive Patient Education  2018 [employer]. Menorrhagia Menorrhagia is a condition in which menstrual periods are heavy or last longer than normal. With menorrhagia, most periods a woman has may cause enough blood loss and cramping that she becomes unable to take part in her usual activities.  What are the causes? Common causes of this condition include:  Noncancerous growths in the uterus (polyps or fibroids).  An imbalance of the estrogen and progesterone hormones.  One of the ovaries not releasing an egg during one or more months.  A problem with the thyroid gland (hypothyroid).  Side effects of having an intrauterine device (IUD).  Side effects of some medicines, such as anti-inflammatory medicines or blood thinners.  A bleeding disorder that stops the blood from clotting normally.  In some cases, the cause of this condition is not known. What are the  signs or symptoms? Symptoms of this condition include:  Routinely having to change your pad or tampon every 1-2 hours because it is completely soaked.  Needing to use pads and tampons at the same time because of heavy bleeding.  Needing to wake up to change your pads or tampons during the night.  Passing blood clots larger than 1 inch (2.5 cm) in size.  Having bleeding that lasts for more than 7 days.  Having symptoms of low iron levels (anemia), such as tiredness, fatigue, or shortness of breath.  How is this diagnosed? This condition may be diagnosed based on:  A physical exam.  Your symptoms and menstrual history.  Tests, such as: ? Blood tests to check if you are pregnant or have hormonal changes, a bleeding or thyroid disorder, anemia, or other problems. ? Pap test to check for cancerous changes, infections, or inflammation. ? Endometrial biopsy. This test involves removing a tissue sample from the lining of the uterus (endometrium) to be examined under a microscope. ? Pelvic ultrasound. This test uses sound waves to create images of your uterus, ovaries, and vagina. The images can show if you have fibroids or other growths. ? Hysteroscopy. For this test, a small telescope is used to look inside your uterus.  How is this treated? Treatment may not be needed for this condition. If it is needed, the best treatment for you will depend on:  Whether you need to prevent pregnancy.  Your desire to have children in the future.  The cause and severity of your bleeding.  Your personal preference.  Medicines are the first step in treatment. You may be treated with:  Hormonal birth control methods. These treatments reduce bleeding during your menstrual period. They include: ? Birth control pills. ? Skin patch. ? Vaginal ring. ? Shots (injections) that you get every 3 months. ? Hormonal IUD (intrauterine device). ? Implants that go under the skin.  Medicines that thicken  blood and slow bleeding.  Medicines that reduce swelling, such as ibuprofen.  Medicines that contain an artificial (synthetic) hormone called progestin.  Medicines that make the ovaries stop working for a short time.  Iron supplements to treat anemia.  If medicines do not work, surgery may be done. Surgical options may include:  Dilation and curettage (D&C). In this procedure, your health care provider opens (dilates) your cervix and then scrapes or suctions tissue from the endometrium to reduce menstrual bleeding.  Operative hysteroscopy. In this procedure, a small tube with a light on the end (hysteroscope) is used to view your uterus and help remove polyps that may be causing heavy periods.  Endometrial ablation. This is when various techniques are used to permanently destroy your entire endometrium. After endometrial ablation, most women have little or no menstrual flow. This procedure reduces your ability to become pregnant.  Endometrial resection. In this procedure, an electrosurgical wire loop is used to remove the endometrium. This  procedure reduces your ability to become pregnant.  Hysterectomy. This is surgical removal of the uterus. This is a permanent procedure that stops menstrual periods. Pregnancy is not possible after a hysterectomy.  Follow these instructions at home: Medicines  Take over-the-counter and prescription medicines exactly as told by your health care provider. This includes iron pills.  Do not change or switch medicines without asking your health care provider.  Do not take aspirin or medicines that contain aspirin 1 week before or during your menstrual period. Aspirin may make bleeding worse. General instructions  If you need to change your sanitary pad or tampon more than once every 2 hours, limit your activity until the bleeding stops.  Iron pills can cause constipation. To prevent or treat constipation while you are taking prescription iron  supplements, your health care provider may recommend that you: ? Drink enough fluid to keep your urine clear or pale yellow. ? Take over-the-counter or prescription medicines. ? Eat foods that are high in fiber, such as fresh fruits and vegetables, whole grains, and beans. ? Limit foods that are high in fat and processed sugars, such as fried and sweet foods.  Eat well-balanced meals, including foods that are high in iron. Foods that have a lot of iron include leafy green vegetables, meat, liver, eggs, and whole grain breads and cereals.  Do not try to lose weight until the abnormal bleeding has stopped and your blood iron level is back to normal. If you need to lose weight, work with your health care provider to lose weight safely.  Keep all follow-up visits as told by your health care provider. This is important. Contact a health care provider if:  You soak through a pad or tampon every 1 or 2 hours, and this happens every time you have a period.  You need to use pads and tampons at the same time because you are bleeding so much.  You have nausea, vomiting, diarrhea, or other problems related to medicines you are taking. Get help right away if:  You soak through more than a pad or tampon in 1 hour.  You pass clots bigger than 1 inch (2.5 cm) wide.  You feel short of breath.  You feel like your heart is beating too fast.  You feel dizzy or faint.  You feel very weak or tired. Summary  Menorrhagia is a condition in which menstrual periods are heavy or last longer than normal.  Treatment will depend on the cause of the condition and may include medicines or procedures.  Take over-the-counter and prescription medicines exactly as told by your health care provider. This includes iron pills.  Get help right away if you have heavy bleeding that soaks through more than a pad or tampon in 1 hour, you are passing large clots, or you feel dizzy, faint or short of breath. This  information is not intended to replace advice given to you by your health care provider. Make sure you discuss any questions you have with your health care provider. Document Released: 11/03/2005 Document Revised: 10/27/2016 Document Reviewed: 10/27/2016 Elsevier Interactive Patient Education  [employer]2018 Elsevier Inc.

## 2017-12-18 LAB — CYTOLOGY - PAP
Diagnosis: NEGATIVE
HPV (WINDOPATH): NOT DETECTED

## 2017-12-18 LAB — TSH: TSH: 1.76 u[IU]/mL (ref 0.450–4.500)

## 2017-12-23 NOTE — H&P (Signed)
Diamond Cook is an 32 y.o. [id number] female.   Chief Complaint: Heavy vaginal bleeding HPI: Reports heavy cycles and s/p Novasure ablation. Ablation 2014. Began having painful cycles following this and placed on OC's which helped to regulate cycles and to decrease pain and bleeding. Stopped OC's and notes bleeding if has no medication on board and pain. Placed on Megace and now bleeding all the time x 3 wks. OCs work better than megace but still bleeding. Ultrasound shows thickened endometrium. Declined office sampling and would like IUD placed at time of sampling.  Past Medical History:  Diagnosis Date  . Anemia   . Anxiety   . Hypertension   . Obesity   . Vertigo     Past Surgical History:  Procedure Laterality Date  . CESAREAN SECTION    . NOVASURE ABLATION    . TUBAL LIGATION      Family History  Problem Relation Age of Onset  . Diabetes Maternal Grandmother   . Hypertension Maternal Grandmother   . Diabetes Maternal Aunt   . Hypertension Maternal Aunt   . Diabetes Maternal Aunt   . Hypertension Maternal Aunt    Social History:  reports that  has never smoked. she has never used smokeless tobacco. She reports that she does not drink alcohol or use drugs.  Allergies: No Known Allergies  No medications prior to admission.    Pertinent items are noted in HPI.  General appearance: alert, cooperative and appears stated age Head: Normocephalic, without obvious abnormality, atraumatic Neck: supple, symmetrical, trachea midline Lungs: normal effort Heart: regular rate and rhythm, S1, S2 normal, no murmur, click, rub or gallop Abdomen: soft, non-tender; bowel sounds normal; no masses,  no organomegaly Extremities: Homans sign is negative, no sign of DVT Skin: Skin color, texture, turgor normal. No rashes or lesions Neurologic: Grossly normal   Lab Results  Component Value Date   WBC 6.6 12/08/2017   HGB 11.4 (L) 12/08/2017   HCT 35.1 (L) 12/08/2017   MCV 76.6 (L)  12/08/2017   PLT 243 12/08/2017   Lab Results  Component Value Date   PREGTESTUR NEGATIVE 12/17/2017   HCG <5.0 12/08/2017     Assessment/Plan Principal Problem:   Menorrhagia with irregular cycle Active Problems:   Iron deficiency anemia  For Dilation and Curettage with IUD placement to stop bleeding.   Risks include but are not limited to bleeding, infection, injury to surrounding structures, including bowel, bladder and ureters, blood clots, and death.  Likelihood of success is high.    Diamond Cook 12/23/2017, 9:53 AM

## 2018-01-04 ENCOUNTER — Telehealth (HOSPITAL_COMMUNITY): Payer: Self-pay

## 2018-01-04 NOTE — Telephone Encounter (Signed)
Patient told Pre-op that she wanted to reschedule her surgery, call patient to confirm and to offer her a different surgery date on 02/26 (open due to cancellation). No answer, left voicemail instructing patient to return my call at the office.

## 2018-01-06 ENCOUNTER — Encounter (HOSPITAL_COMMUNITY): Payer: Self-pay

## 2018-01-25 ENCOUNTER — Encounter (HOSPITAL_COMMUNITY): Payer: Self-pay | Admitting: Emergency Medicine

## 2018-01-25 ENCOUNTER — Ambulatory Visit (HOSPITAL_COMMUNITY)
Admission: EM | Admit: 2018-01-25 | Discharge: 2018-01-25 | Disposition: A | Discharge status: Home / Self Care | Payer: Medicaid Other | Attending: Physician Assistant | Admitting: Physician Assistant

## 2018-01-25 DIAGNOSIS — N921 Excessive and frequent menstruation with irregular cycle: Secondary | ICD-10-CM

## 2018-01-25 DIAGNOSIS — Z760 Encounter for issue of repeat prescription: Secondary | ICD-10-CM | POA: Diagnosis not present

## 2018-01-25 DIAGNOSIS — N938 Other specified abnormal uterine and vaginal bleeding: Secondary | ICD-10-CM

## 2018-01-25 MED ORDER — MEGESTROL ACETATE 40 MG PO TABS
80.0000 mg | ORAL_TABLET | Freq: Two times a day (BID) | ORAL | 0 refills | Status: DC
Start: 2018-01-25 — End: 2018-02-24

## 2018-01-25 NOTE — Discharge Instructions (Signed)
I went ahead and refilled this prescription one time, but you must contact the provide, Tinnie Gensanya Pratt for further refills and f/u, as we are not a facility for this.

## 2018-01-25 NOTE — ED Provider Notes (Signed)
[facility]    CSN: [id number] Arrival date & time: 01/25/18  1728     History   Chief Complaint Chief Complaint  Patient presents with  . Medication Refill    HPI Diamond Cook is a 32 y.o. female.   Who presents for a refill of Megace. She was given this to use up until her D&C in April. Patient did not call her MD for a refill and she is not sure who and when the surgery is. She just called her pharmacy and they ask her to come here. She continues to have abnormal uterine bleeding which is unchanged.       Past Medical History:  Diagnosis Date  . Anemia   . Anxiety   . Hypertension   . Obesity   . Vertigo     Patient Active Problem List   Diagnosis Date Noted  . Menorrhagia with irregular cycle 12/17/2017  . Morbid obesity (HCC) 12/17/2017  . Iron deficiency anemia 12/17/2017  . Benign essential hypertension 12/17/2017    Past Surgical History:  Procedure Laterality Date  . CESAREAN SECTION    . NOVASURE ABLATION    . TUBAL LIGATION      OB History    Gravida Para Term Preterm AB Living   3 3 3     3    SAB TAB Ectopic Multiple Live Births           3       Home Medications    Prior to Admission medications   Medication Sig Start Date End Date Taking? Authorizing Provider  ferrous sulfate 325 (65 FE) MG tablet Take 1 tablet (325 mg total) daily by mouth. 09/23/17   Mardella Layman, MD  hydrochlorothiazide (HYDRODIURIL) 25 MG tablet Take 1 tablet (25 mg total) by mouth daily. 12/17/17   Reva Bores, MD  ibuprofen (ADVIL,MOTRIN) 800 MG tablet Take 1 tablet (800 mg total) by mouth every 8 (eight) hours as needed. Patient taking differently: Take 800 mg by mouth every 8 (eight) hours as needed for moderate pain.  12/17/17   Reva Bores, MD  meclizine (ANTIVERT) 25 MG tablet Take 1 tablet (25 mg total) every 8 (eight) hours as needed by mouth for dizziness. 09/23/17   Mardella Layman, MD  megestrol (MEGACE) 40 MG tablet Take 2 tablets (80 mg total)  by mouth 2 (two) times daily. 12/17/17   Reva Bores, MD  misoprostol (CYTOTEC) 200 MCG tablet take two tablets the night prior to your procedure and 2 the morning of 12/17/17   Reva Bores, MD  norgestimate-ethinyl estradiol (ORTHO-CYCLEN,SPRINTEC,PREVIFEM) 0.25-35 MG-MCG tablet Take 1 tablet daily by mouth. Patient not taking: Reported on 12/17/2017 09/23/17   Mardella Layman, MD    Family History Family History  Problem Relation Age of Onset  . Diabetes Maternal Grandmother   . Hypertension Maternal Grandmother   . Diabetes Maternal Aunt   . Hypertension Maternal Aunt   . Diabetes Maternal Aunt   . Hypertension Maternal Aunt     Social History Social History   Tobacco Use  . Smoking status: Never Smoker  . Smokeless tobacco: Never Used  Substance Use Topics  . Alcohol use: No  . Drug use: No     Allergies   Patient has no known allergies.   Review of Systems Review of Systems   Physical Exam Triage Vital Signs ED Triage Vitals [01/25/18 1810]  Enc Vitals Group     BP 127/75  Pulse Rate 78     Resp 18     Temp 98.6 F (37 C)     Temp Source Oral     SpO2 100 %     Weight      Height      Head Circumference      Peak Flow      Pain Score 0     Pain Loc      Pain Edu?      Excl. in GC?    No data found.  Updated Vital Signs BP 127/75 (BP Location: Right Arm)   Pulse 78   Temp 98.6 F (37 C) (Oral)   Resp 18   SpO2 100%   Visual Acuity Right Eye Distance:   Left Eye Distance:   Bilateral Distance:    Right Eye Near:   Left Eye Near:    Bilateral Near:     Physical Exam  Constitutional: She is oriented to person, place, and time. She appears well-developed and well-nourished. No distress.  Neurological: She is alert and oriented to person, place, and time.  Skin: Skin is warm and dry. She is not diaphoretic.  Psychiatric: Her behavior is normal.  Nursing note and vitals reviewed.    UC Treatments / Results  Labs (all labs ordered  are listed, but only abnormal results are displayed) Labs Reviewed - No data to display  EKG  EKG Interpretation None       Radiology No results found.  Procedures Procedures (including critical care time)  Medications Ordered in UC Medications - No data to display   Initial Impression / Assessment and Plan / UC Course  I have reviewed the triage vital signs and the nursing notes.  Pertinent labs & imaging results that were available during my care of the patient were reviewed by me and considered in my medical decision making (see chart for details).   I did review Dr. Tawni LevyPratt's last clinic note which did instruct patient to continue on Megace until surgery. I provided this information to the patient and educated regarding how to get a refill from the prescribing provider. I did provide one refill as instructed, but further refills will need to come from the current physician.     Final Clinical Impressions(s) / UC Diagnoses   Final diagnoses:  None    ED Discharge Orders    None       Controlled Substance Prescriptions [place] Controlled Substance Registry consulted? Not Applicable   Sharin MonsYoung, Gedalia Mcmillon G, PA-C 01/25/18 1859

## 2018-01-25 NOTE — ED Triage Notes (Signed)
Pt here for medication refill of megesterone

## 2018-02-04 ENCOUNTER — Other Ambulatory Visit (HOSPITAL_COMMUNITY)
Admission: RE | Admit: 2018-02-04 | Discharge: 2018-02-04 | Disposition: A | Discharge status: Home / Self Care | Payer: Medicaid Other | Source: Ambulatory Visit | Attending: Family Medicine | Admitting: Family Medicine

## 2018-02-04 ENCOUNTER — Ambulatory Visit: Payer: Medicaid Other

## 2018-02-04 DIAGNOSIS — B9689 Other specified bacterial agents as the cause of diseases classified elsewhere: Secondary | ICD-10-CM | POA: Diagnosis not present

## 2018-02-04 DIAGNOSIS — Z202 Contact with and (suspected) exposure to infections with a predominantly sexual mode of transmission: Secondary | ICD-10-CM

## 2018-02-04 NOTE — Progress Notes (Signed)
Chart reviewed - agree with RN documentation.   

## 2018-02-04 NOTE — Progress Notes (Signed)
Pt here today for STD check.  Pt reports that she may have a STD or yeast infection. Patient instructed on how to obtain self swab. Pt also advised we will contact her with abnormal results, Pt gave permission to LM on VM.

## 2018-02-05 LAB — CERVICOVAGINAL ANCILLARY ONLY
Bacterial vaginitis: POSITIVE — AB
CANDIDA VAGINITIS: NEGATIVE
CHLAMYDIA, DNA PROBE: NEGATIVE
NEISSERIA GONORRHEA: NEGATIVE
Trichomonas: NEGATIVE

## 2018-02-08 ENCOUNTER — Encounter: Payer: Self-pay | Admitting: Family Medicine

## 2018-02-08 ENCOUNTER — Other Ambulatory Visit: Payer: Self-pay | Admitting: Family Medicine

## 2018-02-08 MED ORDER — METRONIDAZOLE 500 MG PO TABS: 500.0000 mg | ORAL_TABLET | Freq: Two times a day (BID) | ORAL | 0 refills | Status: DC

## 2018-02-09 NOTE — Patient Instructions (Addendum)
Your procedure is scheduled on: Wednesday, March 10  [facility] at: 11:30 am  Pick up the phone at the desk and dial [phone].  Call this number if you have problems the morning of surgery: [phone].  Remember: Do NOT eat food after midnight Tuesday.  Do NOT drink clear liquids (including water) after: 7 am Wednesday, day of surgery.  Take these medicines the morning of surgery with a SIP OF WATER:  None  Do Not smoke on the day of surgery.  Stop herbal medications and supplements at this time.  Do NOT wear jewelry (body piercing), metal hair clips/bobby pins, make-up, or nail polish. Do NOT wear lotions, powders, or perfumes.  You may wear deoderant. Do NOT shave for 48 hours prior to surgery. Do NOT bring valuables to the hospital. Contacts, dentures, or bridgework may not be worn into surgery.  Have a responsible adult drive you home and stay with you for 24 hours after your procedure.

## 2018-02-15 ENCOUNTER — Encounter (HOSPITAL_COMMUNITY)
Admission: RE | Admit: 2018-02-15 | Discharge: 2018-02-15 | Disposition: A | Discharge status: Home / Self Care | Payer: Medicaid Other | Source: Ambulatory Visit | Attending: Family Medicine | Admitting: Family Medicine

## 2018-02-15 NOTE — Patient Instructions (Addendum)
Your procedure is scheduled on: Wednesday, March 10  [facility] at: 11:30 am  Pick up the phone at the desk and dial [phone].  Call this number if you have problems the morning of surgery: [phone].  Remember: Do NOT eat food after midnight Tuesday.  Do NOT drink clear liquids (including water) after: 7 am Wednesday, day of surgery.  Take these medicines the morning of surgery with a SIP OF WATER:  misoprostol (CYTOTEC) as directed by MD.  Stop herbal medications, vitamin supplements and ibuprofen at this time.  Do NOT wear jewelry (body piercing), metal hair clips/bobby pins, make-up, or nail polish. Do NOT wear lotions, powders, or perfumes.  You may wear deoderant. Do NOT shave for 48 hours prior to surgery. Do NOT bring valuables to the hospital.  Have a responsible adult drive you home and stay with you for 24 hours after your procedure.  Home with husband Diamond Cook cell [phone].

## 2018-02-19 ENCOUNTER — Other Ambulatory Visit: Payer: Self-pay

## 2018-02-19 ENCOUNTER — Encounter (HOSPITAL_COMMUNITY): Payer: Self-pay

## 2018-02-19 ENCOUNTER — Encounter (HOSPITAL_COMMUNITY)
Admission: RE | Admit: 2018-02-19 | Discharge: 2018-02-19 | Disposition: A | Discharge status: Home / Self Care | Payer: Medicaid Other | Source: Ambulatory Visit | Attending: Family Medicine | Admitting: Family Medicine

## 2018-02-19 DIAGNOSIS — Z01812 Encounter for preprocedural laboratory examination: Secondary | ICD-10-CM | POA: Diagnosis present

## 2018-02-19 HISTORY — DX: Depression, unspecified: F32.A

## 2018-02-19 HISTORY — DX: Major depressive disorder, single episode, unspecified: F32.9

## 2018-02-19 HISTORY — DX: Personal history of other medical treatment: Z92.89

## 2018-02-19 LAB — CBC
HEMATOCRIT: 35.8 % — AB (ref 36.0–46.0)
HEMOGLOBIN: 11.6 g/dL — AB (ref 12.0–15.0)
MCH: 25.3 pg — AB (ref 26.0–34.0)
MCHC: 32.4 g/dL (ref 30.0–36.0)
MCV: 78 fL (ref 78.0–100.0)
Platelets: 205 10*3/uL (ref 150–400)
RBC: 4.59 MIL/uL (ref 3.87–5.11)
RDW: 15.8 % — AB (ref 11.5–15.5)
WBC: 7.4 10*3/uL (ref 4.0–10.5)

## 2018-02-19 LAB — BASIC METABOLIC PANEL
Anion gap: 9 (ref 5–15)
BUN: 16 mg/dL (ref 6–20)
CALCIUM: 8.9 mg/dL (ref 8.9–10.3)
CHLORIDE: 108 mmol/L (ref 101–111)
CO2: 22 mmol/L (ref 22–32)
CREATININE: 0.83 mg/dL (ref 0.44–1.00)
GFR calc non Af Amer: >60 mL/min (ref >60)
GLUCOSE: 110 mg/dL — AB (ref 65–99)
Potassium: 3.6 mmol/L (ref 3.5–5.1)
Sodium: 139 mmol/L (ref 135–145)

## 2018-02-19 NOTE — Pre-Procedure Instructions (Signed)
SDS BB History Log given to lab for patient's previous history of blood transfusion in [place] in 2013.

## 2018-02-24 ENCOUNTER — Encounter (HOSPITAL_COMMUNITY): Payer: Self-pay | Admitting: Anesthesiology

## 2018-02-24 ENCOUNTER — Ambulatory Visit (HOSPITAL_COMMUNITY): Payer: Medicaid Other | Admitting: Certified Registered Nurse Anesthetist

## 2018-02-24 ENCOUNTER — Ambulatory Visit (HOSPITAL_COMMUNITY)
Admission: RE | Admit: 2018-02-24 | Discharge: 2018-02-24 | Disposition: A | Discharge status: Home / Self Care | Payer: Medicaid Other | Source: Ambulatory Visit | Attending: Family Medicine | Admitting: Family Medicine

## 2018-02-24 ENCOUNTER — Encounter (HOSPITAL_COMMUNITY)
Admission: RE | Disposition: A | Discharge status: Home / Self Care | Payer: Self-pay | Source: Ambulatory Visit | Attending: Family Medicine

## 2018-02-24 ENCOUNTER — Other Ambulatory Visit: Payer: Self-pay

## 2018-02-24 DIAGNOSIS — E669 Obesity, unspecified: Secondary | ICD-10-CM | POA: Insufficient documentation

## 2018-02-24 DIAGNOSIS — Z79899 Other long term (current) drug therapy: Secondary | ICD-10-CM | POA: Insufficient documentation

## 2018-02-24 DIAGNOSIS — N921 Excessive and frequent menstruation with irregular cycle: Secondary | ICD-10-CM | POA: Diagnosis present

## 2018-02-24 DIAGNOSIS — N939 Abnormal uterine and vaginal bleeding, unspecified: Secondary | ICD-10-CM | POA: Diagnosis not present

## 2018-02-24 DIAGNOSIS — I10 Essential (primary) hypertension: Secondary | ICD-10-CM | POA: Insufficient documentation

## 2018-02-24 DIAGNOSIS — D509 Iron deficiency anemia, unspecified: Secondary | ICD-10-CM | POA: Diagnosis not present

## 2018-02-24 HISTORY — PX: INTRAUTERINE DEVICE (IUD) INSERTION: SHX5877

## 2018-02-24 HISTORY — PX: DILATION AND CURETTAGE OF UTERUS: SHX78

## 2018-02-24 LAB — PREGNANCY, URINE: PREG TEST UR: NEGATIVE

## 2018-02-24 SURGERY — DILATION AND CURETTAGE
Anesthesia: General | Site: Vagina

## 2018-02-24 MED ORDER — DEXAMETHASONE SODIUM PHOSPHATE 4 MG/ML IJ SOLN
INTRAMUSCULAR | Status: AC
  Filled 2018-02-24: qty 1

## 2018-02-24 MED ORDER — MIDAZOLAM HCL 2 MG/2ML IJ SOLN
INTRAMUSCULAR | Status: AC
  Filled 2018-02-24: qty 2

## 2018-02-24 MED ORDER — LACTATED RINGERS IV SOLN
INTRAVENOUS | Status: DC
  Administered 2018-02-24 (×2): via INTRAVENOUS

## 2018-02-24 MED ORDER — FENTANYL CITRATE (PF) 100 MCG/2ML IJ SOLN
INTRAMUSCULAR | Status: DC | PRN
  Administered 2018-02-24 (×2): 50 ug via INTRAVENOUS
  Administered 2018-02-24: 100 ug via INTRAVENOUS

## 2018-02-24 MED ORDER — LIDOCAINE HCL (CARDIAC) 20 MG/ML IV SOLN
INTRAVENOUS | Status: DC | PRN
  Administered 2018-02-24: 50 mg via INTRAVENOUS

## 2018-02-24 MED ORDER — OXYCODONE HCL 5 MG/5ML PO SOLN: 5.0000 mg | Freq: Once | ORAL | Status: DC | PRN

## 2018-02-24 MED ORDER — SCOPOLAMINE 1 MG/3DAYS TD PT72
MEDICATED_PATCH | TRANSDERMAL | Status: AC
  Administered 2018-02-24: 1.5 mg via TRANSDERMAL
  Filled 2018-02-24: qty 1

## 2018-02-24 MED ORDER — PROPOFOL 10 MG/ML IV BOLUS
INTRAVENOUS | Status: AC
  Filled 2018-02-24: qty 20

## 2018-02-24 MED ORDER — FENTANYL CITRATE (PF) 100 MCG/2ML IJ SOLN
INTRAMUSCULAR | Status: AC
  Filled 2018-02-24: qty 2

## 2018-02-24 MED ORDER — KETOROLAC TROMETHAMINE 30 MG/ML IJ SOLN
INTRAMUSCULAR | Status: AC
  Filled 2018-02-24: qty 1

## 2018-02-24 MED ORDER — HYDROMORPHONE HCL 1 MG/ML IJ SOLN
0.2500 mg | INTRAMUSCULAR | Status: DC | PRN
  Administered 2018-02-24: 0.5 mg via INTRAVENOUS

## 2018-02-24 MED ORDER — ROCURONIUM BROMIDE 100 MG/10ML IV SOLN
INTRAVENOUS | Status: AC
  Filled 2018-02-24: qty 1

## 2018-02-24 MED ORDER — LIDOCAINE HCL (CARDIAC) 20 MG/ML IV SOLN
INTRAVENOUS | Status: AC
  Filled 2018-02-24: qty 5

## 2018-02-24 MED ORDER — DEXAMETHASONE SODIUM PHOSPHATE 10 MG/ML IJ SOLN
INTRAMUSCULAR | Status: DC | PRN
  Administered 2018-02-24: 4 mg via INTRAVENOUS

## 2018-02-24 MED ORDER — PROPOFOL 10 MG/ML IV BOLUS
INTRAVENOUS | Status: DC | PRN
  Administered 2018-02-24: 200 mg via INTRAVENOUS

## 2018-02-24 MED ORDER — ONDANSETRON HCL 4 MG/2ML IJ SOLN
INTRAMUSCULAR | Status: AC
  Filled 2018-02-24: qty 2

## 2018-02-24 MED ORDER — MEPERIDINE HCL 25 MG/ML IJ SOLN: 6.2500 mg | INTRAMUSCULAR | Status: DC | PRN

## 2018-02-24 MED ORDER — LIDOCAINE HCL 1 % IJ SOLN
INTRAMUSCULAR | Status: AC
  Filled 2018-02-24: qty 20

## 2018-02-24 MED ORDER — LACTATED RINGERS IV SOLN: INTRAVENOUS | Status: DC

## 2018-02-24 MED ORDER — KETOROLAC TROMETHAMINE 30 MG/ML IJ SOLN
INTRAMUSCULAR | Status: DC | PRN
  Administered 2018-02-24: 30 mg via INTRAVENOUS

## 2018-02-24 MED ORDER — OXYCODONE HCL 5 MG PO TABS: 5.0000 mg | ORAL_TABLET | Freq: Once | ORAL | Status: DC | PRN

## 2018-02-24 MED ORDER — LIDOCAINE HCL 1 % IJ SOLN
INTRAMUSCULAR | Status: DC | PRN
  Administered 2018-02-24: 20 mL

## 2018-02-24 MED ORDER — PROMETHAZINE HCL 25 MG/ML IJ SOLN: 6.2500 mg | INTRAMUSCULAR | Status: DC | PRN

## 2018-02-24 MED ORDER — HYDROMORPHONE HCL 1 MG/ML IJ SOLN
INTRAMUSCULAR | Status: AC
  Filled 2018-02-24: qty 0.5

## 2018-02-24 MED ORDER — MIDAZOLAM HCL 2 MG/2ML IJ SOLN
INTRAMUSCULAR | Status: DC | PRN
  Administered 2018-02-24: 2 mg via INTRAVENOUS

## 2018-02-24 MED ORDER — SCOPOLAMINE 1 MG/3DAYS TD PT72
1.0000 | MEDICATED_PATCH | Freq: Once | TRANSDERMAL | Status: DC
  Administered 2018-02-24: 1.5 mg via TRANSDERMAL

## 2018-02-24 MED ORDER — IBUPROFEN 800 MG PO TABS: 800.0000 mg | ORAL_TABLET | Freq: Three times a day (TID) | ORAL | 1 refills | Status: DC | PRN

## 2018-02-24 MED ORDER — ONDANSETRON HCL 4 MG/2ML IJ SOLN
INTRAMUSCULAR | Status: DC | PRN
  Administered 2018-02-24: 4 mg via INTRAVENOUS

## 2018-02-24 SURGICAL SUPPLY — 14 items
CATH ROBINSON RED A/P 16FR (CATHETERS) ×3 IMPLANT
CNTNR SPEC C3OZ STD GRAD LEK (MISCELLANEOUS) ×1 IMPLANT
CONT SPEC 3OZ W/LID STRL (MISCELLANEOUS) ×2
DECANTER SPIKE VIAL GLASS SM (MISCELLANEOUS) ×3 IMPLANT
DILATOR CANAL MILEX (MISCELLANEOUS) ×3 IMPLANT
GLOVE BIOGEL PI IND STRL 7.0 (GLOVE) ×2 IMPLANT
GLOVE BIOGEL PI INDICATOR 7.0 (GLOVE) ×4
GLOVE ECLIPSE 7.0 STRL STRAW (GLOVE) ×3 IMPLANT
GOWN STRL REUS W/TWL LRG LVL3 (GOWN DISPOSABLE) ×6 IMPLANT
NEEDLE SPNL 22GX3.5 QUINCKE BK (NEEDLE) ×3 IMPLANT
PACK VAGINAL MINOR WOMEN LF (CUSTOM PROCEDURE TRAY) ×3 IMPLANT
PAD OB MATERNITY 4.3X12.25 (PERSONAL CARE ITEMS) ×3 IMPLANT
PAD PREP 24X48 CUFFED NSTRL (MISCELLANEOUS) ×3 IMPLANT
TOWEL OR 17X24 6PK STRL BLUE (TOWEL DISPOSABLE) ×6 IMPLANT

## 2018-02-24 NOTE — Anesthesia Preprocedure Evaluation (Addendum)
Anesthesia Evaluation  Patient identified by MRN, date of birth, ID band Patient awake    Reviewed: Allergy & Precautions, NPO status , Patient's Chart, lab work & pertinent test results  Airway Mallampati: I  TM Distance: >3 FB Neck ROM: Full    Dental  (+) Teeth Intact, Dental Advisory Given   Pulmonary neg pulmonary ROS,    breath sounds clear to auscultation       Cardiovascular hypertension, Pt. on medications  Rhythm:Regular Rate:Normal     Neuro/Psych PSYCHIATRIC DISORDERS Anxiety Depression negative neurological ROS     GI/Hepatic negative GI ROS, Neg liver ROS,   Endo/Other  negative endocrine ROS  Renal/GU negative Renal ROS  negative genitourinary   Musculoskeletal negative musculoskeletal ROS (+)   Abdominal Normal abdominal exam  (+)   Peds  Hematology   Anesthesia Other Findings   Reproductive/Obstetrics                            Lab Results  Component Value Date   WBC 7.4 02/19/2018   HGB 11.6 (L) 02/19/2018   HCT 35.8 (L) 02/19/2018   MCV 78.0 02/19/2018   PLT 205 02/19/2018   EKG: normal sinus rhythm.   Anesthesia Physical Anesthesia Plan  ASA: IV  Anesthesia Plan: General   Post-op Pain Management:    Induction: Intravenous  PONV Risk Score and Plan: 4 or greater and Ondansetron, Dexamethasone, Midazolam and Scopolamine patch - Pre-op  Airway Management Planned: LMA  Additional Equipment: None  Intra-op Plan:   Post-operative Plan: Extubation in OR  Informed Consent: I have reviewed the patients History and Physical, chart, labs and discussed the procedure including the risks, benefits and alternatives for the proposed anesthesia with the patient or authorized representative who has indicated his/her understanding and acceptance.   Dental advisory given  Plan Discussed with: CRNA  Anesthesia Plan Comments:        Anesthesia Quick  Evaluation

## 2018-02-24 NOTE — Interval H&P Note (Signed)
History and Physical Interval Note:  02/24/2018 2:04 PM  Diamond Cook  has presented today for surgery, with the diagnosis of AUB  The various methods of treatment have been discussed with the patient and family. After consideration of risks, benefits and other options for treatment, the patient has consented to  Procedure(s): DILATATION AND CURETTAGE (N/A) INTRAUTERINE DEVICE (IUD) INSERTION (N/A) as a surgical intervention .  The patient's history has been reviewed, patient examined, no change in status, stable for surgery.  I have reviewed the patient's chart and labs.  Questions were answered to the patient's satisfaction.     Reva Boresanya S Rayner Erman

## 2018-02-24 NOTE — Anesthesia Postprocedure Evaluation (Signed)
Anesthesia Post Note  Patient: Diamond Cook  Procedure(s) Performed: DILATATION AND CURETTAGE (N/A Vagina ) INTRAUTERINE DEVICE (IUD) INSERTION (N/A Vagina )     Patient location during evaluation: PACU Anesthesia Type: General Level of consciousness: awake and alert Pain management: pain level controlled Vital Signs Assessment: post-procedure vital signs reviewed and stable Respiratory status: spontaneous breathing, nonlabored ventilation, respiratory function stable and patient connected to nasal cannula oxygen Cardiovascular status: blood pressure returned to baseline and stable Postop Assessment: no apparent nausea or vomiting Anesthetic complications: no    Last Vitals:  Vitals:   02/24/18 1545 02/24/18 1600  BP: 126/79 123/78  Pulse: 71 72  Resp: 14 17  Temp:    SpO2: 96% 98%    Last Pain:  Vitals:   02/24/18 1600  TempSrc:   PainSc: 0-No pain   Pain Goal: Patients Stated Pain Goal: 2 (02/24/18 1252)               Shelton SilvasKevin D Aquila Delaughter

## 2018-02-24 NOTE — Op Note (Signed)
PROCEDURE DATE: 02/24/2018  PREOPERATIVE DIAGNOSIS: abnormal bleeding  POSTOPERATIVE DIAGNOSIS: The same  PROCEDURE:     Dilation and Curettage and Liletta IUD placement  SURGEON:  Paylin Hailu S  INDICATIONS: h/o C-section x 3, Novasure ablation in 2014 with return of bleeding poorly controlled with Megace or OCs. Thickened endometrium on U/S. Risks of surgery were discussed with the patient including but not limited to: bleeding which may require transfusion; infection which may require antibiotics; injury to uterus or surrounding organs.  Likelihood of obtaining sample is high.  FINDINGS:  A normal size uterus, nml appearing cervix.  ANESTHESIA:    GETT Shelton Silvas- Hollis, Kevin D, MD  and paracervical block-Tinnie Kunin  ESTIMATED BLOOD LOSS:  Less than 20 ml.  SPECIMENS:  Endometrial curettings to pathology  COMPLICATIONS:  None immediate.  PROCEDURE DETAILS:  The patient received intravenous antibiotics while in the preoperative area.  She was then taken to the operating room where general anesthesia was administered and was found to be adequate.  After an adequate timeout was performed, she was placed in the dorsal lithotomy position and examined; then prepped and draped in the sterile manner.  Her bladder was catheterized for an unmeasured amount of clear, yellow urine. A vaginal speculum was then placed in the patient's vagina and a single tooth tenaculum was applied to the anterior lip of the cervix.  A paracervical block using 1% Lidocaine was administered. The cervix was gently dilated to accommodate a small curette that was gently advanced to the uterine fundus.  A sharp curettage was then performed to obtain adequate specimen. Liletta IUD placed per manufacturer's instructions. There was minimal bleeding noted and the tenaculum removed with good hemostasis noted.  The patient tolerated the procedure well.  The patient was taken to the recovery area in stable condition.  Shelbie Proctoranya S  PrattMD 02/24/2018 2:48 PM

## 2018-02-24 NOTE — Discharge Instructions (Signed)
Dilation and Curettage or Vacuum Curettage, Care After °These instructions give you information about caring for yourself after your procedure. Your doctor may also give you more specific instructions. Call your doctor if you have any problems or questions after your procedure. °Follow these instructions at home: °Activity °· Do not drive or use heavy machinery while taking prescription pain medicine. °· For 24 hours after your procedure, avoid driving. °· Take short walks often, followed by rest periods. Ask your doctor what activities are safe for you. After one or two days, you may be able to return to your normal activities. °· Do not lift anything that is heavier than 10 lb (4.5 kg) until your doctor approves. °· For at least 2 weeks, or as long as told by your doctor: °? Do not douche. °? Do not use tampons. °? Do not have sex. °General instructions °· Take over-the-counter and prescription medicines only as told by your doctor. This is very important if you take blood thinning medicine. °· Do not take baths, swim, or use a hot tub until your doctor approves. Take showers instead of baths. °· Wear compression stockings as told by your doctor. °· It is up to you to get the results of your procedure. Ask your doctor when your results will be ready. °· Keep all follow-up visits as told by your doctor. This is important. °Contact a doctor if: °· You have very bad cramps that get worse or do not get better with medicine. °· You have very bad pain in your belly (abdomen). °· You cannot drink fluids without throwing up (vomiting). °· You get pain in a different part of the area between your belly and thighs (pelvis). °· You have bad-smelling discharge from your vagina. °· You have a rash. °Get help right away if: °· You are bleeding a lot from your vagina. A lot of bleeding means soaking more than one sanitary pad in an hour, for 2 hours in a row. °· You have clumps of blood (blood clots) coming from your  vagina. °· You have a fever or chills. °· Your belly feels very tender or hard. °· You have chest pain. °· You have trouble breathing. °· You cough up blood. °· You feel dizzy. °· You feel light-headed. °· You pass out (faint). °· You have pain in your neck or shoulder area. °Summary °· Take short walks often, followed by rest periods. Ask your doctor what activities are safe for you. After one or two days, you may be able to return to your normal activities. °· Do not lift anything that is heavier than 10 lb (4.5 kg) until your doctor approves. °· Do not take baths, swim, or use a hot tub until your doctor approves. Take showers instead of baths. °· Contact your doctor if you have any symptoms of infection, like bad-smelling discharge from your vagina. °This information is not intended to replace advice given to you by your health care provider. Make sure you discuss any questions you have with your health care provider. °Document Released: 08/12/2008 Document Revised: 07/21/2016 Document Reviewed: 07/21/2016 °Elsevier Interactive Patient Education © 2017 [employer] ° °Post Anesthesia Home Care Instructions ° °Activity: °Get plenty of rest for the remainder of the day. A responsible individual must stay with you for 24 hours following the procedure.  °For the next 24 hours, DO NOT: °-Drive a car °-Operate machinery °-Drink alcoholic beverages °-Take any medication unless instructed by your physician °-Make any legal decisions or sign   important papers. ° °Meals: °Start with liquid foods such as gelatin or soup. Progress to regular foods as tolerated. Avoid greasy, spicy, heavy foods. If nausea and/or vomiting occur, drink only clear liquids until the nausea and/or vomiting subsides. Call your physician if vomiting continues. ° °Special Instructions/Symptoms: °Your throat may feel dry or sore from the anesthesia or the breathing tube placed in your throat during surgery. If this causes discomfort, gargle with warm  salt water. The discomfort should disappear within 24 hours. ° °If you had a scopolamine patch placed behind your ear for the management of post- operative nausea and/or vomiting: ° °1. The medication in the patch is effective for 72 hours, after which it should be removed.  Wrap patch in a tissue and discard in the trash. Wash hands thoroughly with soap and water. °2. You may remove the patch earlier than 72 hours if you experience unpleasant side effects which may include dry mouth, dizziness or visual disturbances. °3. Avoid touching the patch. Wash your hands with soap and water after contact with the patch. °  ° °

## 2018-02-24 NOTE — Transfer of Care (Signed)
Immediate Anesthesia Transfer of Care Note  Patient: Diamond Cook  Procedure(s) Performed: DILATATION AND CURETTAGE (N/A Vagina ) INTRAUTERINE DEVICE (IUD) INSERTION (N/A Vagina )  Patient Location: PACU  Anesthesia Type:General  Level of Consciousness: awake, alert  and oriented  Airway & Oxygen Therapy: Patient Spontanous Breathing and Patient connected to nasal cannula oxygen  Post-op Assessment: Report given to RN and Post -op Vital signs reviewed and stable  Post vital signs: Reviewed and stable  Last Vitals:  Vitals Value Taken Time  BP    Temp    Pulse 87 02/24/2018  3:02 PM  Resp 13 02/24/2018  3:02 PM  SpO2 100 % 02/24/2018  3:02 PM  Vitals shown include unvalidated device data.  Last Pain:  Vitals:   02/24/18 1252  TempSrc: Oral      Patients Stated Pain Goal: 2 (02/24/18 1252) Pain is 0/10  Complications: No apparent anesthesia complications

## 2018-02-24 NOTE — Anesthesia Procedure Notes (Signed)
Procedure Name: LMA Insertion Date/Time: 02/24/2018 2:30 PM Performed by: Cleda ClarksBrowder, Tamiyah Moulin R, CRNA Pre-anesthesia Checklist: Patient identified, Emergency Drugs available, Suction available and Patient being monitored Patient Re-evaluated:Patient Re-evaluated prior to induction Oxygen Delivery Method: Circle system utilized Preoxygenation: Pre-oxygenation with 100% oxygen Induction Type: IV induction Ventilation: Mask ventilation without difficulty LMA: LMA inserted LMA Size: 4.0 Number of attempts: 1 Placement Confirmation: positive ETCO2 Tube secured with: Tape Dental Injury: Teeth and Oropharynx as per pre-operative assessment

## 2018-02-25 ENCOUNTER — Encounter (HOSPITAL_COMMUNITY): Payer: Self-pay | Admitting: Family Medicine

## 2018-03-04 ENCOUNTER — Encounter (INDEPENDENT_AMBULATORY_CARE_PROVIDER_SITE_OTHER): Payer: Self-pay

## 2018-03-26 ENCOUNTER — Ambulatory Visit: Payer: Medicaid Other | Admitting: Family Medicine

## 2018-04-06 ENCOUNTER — Telehealth: Payer: Self-pay

## 2018-04-06 NOTE — Telephone Encounter (Signed)
Pt called and stated that she had a IUD placed and has been bleeding for two weeks.  Pt is requesting to have a refill on Megace to stop the bleeding.

## 2018-04-13 ENCOUNTER — Encounter: Payer: Self-pay | Admitting: Family Medicine

## 2018-04-13 ENCOUNTER — Ambulatory Visit (INDEPENDENT_AMBULATORY_CARE_PROVIDER_SITE_OTHER): Payer: Medicaid Other | Admitting: Family Medicine

## 2018-04-13 VITALS — BP 118/46 | HR 81 | Wt 313.6 lb

## 2018-04-13 DIAGNOSIS — D500 Iron deficiency anemia secondary to blood loss (chronic): Secondary | ICD-10-CM

## 2018-04-13 DIAGNOSIS — Z30431 Encounter for routine checking of intrauterine contraceptive device: Secondary | ICD-10-CM | POA: Diagnosis not present

## 2018-04-13 MED ORDER — FERROUS SULFATE 325 (65 FE) MG PO TABS: 325.0000 mg | ORAL_TABLET | Freq: Every day | ORAL | 3 refills | Status: DC

## 2018-04-13 NOTE — Patient Instructions (Signed)

## 2018-04-13 NOTE — Progress Notes (Signed)
   Subjective:    Patient ID: Diamond Cook is a 32 y.o. female presenting with IUD check  on 04/13/2018  HPI: Patient is s/p IUD placement with D and C on 02/24/18. Has been spotting since placement. She then stopped bleeding about 3 days ago. Is otherwise feeling well.  Review of Systems  Constitutional: Negative for chills and fever.  Respiratory: Negative for shortness of breath.   Cardiovascular: Negative for chest pain.  Gastrointestinal: Negative for abdominal pain, nausea and vomiting.  Genitourinary: Negative for dysuria.  Skin: Negative for rash.      Objective:    BP (!) 118/46   Pulse 81   Wt (!) 313 lb 9.6 oz (142.2 kg)   BMI 50.62 kg/m  Physical Exam  Constitutional: She is oriented to person, place, and time. She appears well-developed and well-nourished. No distress.  HENT:  Head: Normocephalic and atraumatic.  Eyes: No scleral icterus.  Neck: Neck supple.  Cardiovascular: Normal rate.  Pulmonary/Chest: Effort normal.  Abdominal: Soft.  Genitourinary: Vagina normal.  Genitourinary Comments: IUD strings noted  Neurological: She is alert and oriented to person, place, and time.  Skin: Skin is warm and dry.  Psychiatric: She has a normal mood and affect.        Assessment & Plan:  Iron deficiency anemia due to chronic blood loss - refilled iron and take for 1-2 months, then may stop - Plan: ferrous sulfate 325 (65 FE) MG tablet  IUD check up - Hopefully will continue to control her bleeding.   Total face-to-face time with patient: 10 minutes. Over 50% of encounter was spent on counseling and coordination of care. Return if symptoms worsen or fail to improve.  Reva Bores 04/13/2018 11:32 AM

## 2018-04-13 NOTE — Telephone Encounter (Signed)
Pt concerns address at her visit on 04/13/18.

## 2018-05-05 ENCOUNTER — Encounter (HOSPITAL_COMMUNITY): Payer: Self-pay | Admitting: Emergency Medicine

## 2018-05-05 ENCOUNTER — Emergency Department (HOSPITAL_COMMUNITY)
Admission: EM | Admit: 2018-05-05 | Discharge: 2018-05-05 | Disposition: A | Discharge status: Home / Self Care | Payer: Medicaid Other | Attending: Emergency Medicine | Admitting: Emergency Medicine

## 2018-05-05 DIAGNOSIS — Z79899 Other long term (current) drug therapy: Secondary | ICD-10-CM | POA: Diagnosis not present

## 2018-05-05 DIAGNOSIS — I10 Essential (primary) hypertension: Secondary | ICD-10-CM | POA: Diagnosis not present

## 2018-05-05 DIAGNOSIS — R42 Dizziness and giddiness: Secondary | ICD-10-CM | POA: Diagnosis not present

## 2018-05-05 DIAGNOSIS — D500 Iron deficiency anemia secondary to blood loss (chronic): Secondary | ICD-10-CM

## 2018-05-05 LAB — I-STAT BETA HCG BLOOD, ED (MC, WL, AP ONLY)

## 2018-05-05 LAB — BASIC METABOLIC PANEL
Anion gap: 8 (ref 5–15)
BUN: 11 mg/dL (ref 6–20)
CO2: 26 mmol/L (ref 22–32)
Calcium: 8.8 mg/dL — ABNORMAL LOW (ref 8.9–10.3)
Chloride: 105 mmol/L (ref 101–111)
Creatinine, Ser: 0.82 mg/dL (ref 0.44–1.00)
GFR calc Af Amer: >60 mL/min (ref >60)
GFR calc non Af Amer: >60 mL/min (ref >60)
GLUCOSE: 119 mg/dL — AB (ref 65–99)
Potassium: 3.5 mmol/L (ref 3.5–5.1)
Sodium: 139 mmol/L (ref 135–145)

## 2018-05-05 LAB — URINALYSIS, ROUTINE W REFLEX MICROSCOPIC
BACTERIA UA: NONE SEEN
BILIRUBIN URINE: NEGATIVE
Glucose, UA: NEGATIVE mg/dL
Ketones, ur: NEGATIVE mg/dL
Leukocytes, UA: NEGATIVE
Nitrite: NEGATIVE
Protein, ur: NEGATIVE mg/dL
SPECIFIC GRAVITY, URINE: 1.01 (ref 1.005–1.030)
pH: 5 (ref 5.0–8.0)

## 2018-05-05 LAB — CBC
HCT: 36.8 % (ref 36.0–46.0)
HEMOGLOBIN: 11.3 g/dL — AB (ref 12.0–15.0)
MCH: 24.8 pg — AB (ref 26.0–34.0)
MCHC: 30.7 g/dL (ref 30.0–36.0)
MCV: 80.9 fL (ref 78.0–100.0)
Platelets: 221 10*3/uL (ref 150–400)
RBC: 4.55 MIL/uL (ref 3.87–5.11)
RDW: 15.2 % (ref 11.5–15.5)
WBC: 5.9 10*3/uL (ref 4.0–10.5)

## 2018-05-05 LAB — I-STAT TROPONIN, ED: TROPONIN I, POC: 0 ng/mL (ref 0.00–0.08)

## 2018-05-05 MED ORDER — FERROUS SULFATE 325 (65 FE) MG PO TABS: 325.0000 mg | ORAL_TABLET | Freq: Every day | ORAL | 0 refills | Status: DC

## 2018-05-05 NOTE — ED Triage Notes (Addendum)
Pt reports feeling lightheaded like she is going to pass out when she stands up, states it started around 5pm yesterday. States she has a hx of anemia and has been out of her iron pills for a while, thinks this may be the cause. Pt a/ox4 resp e/u,nad. Speech clear, face symmetrical, no neuro deficits. Pt ambulatory to triage without issue.

## 2018-05-05 NOTE — ED Provider Notes (Signed)
[facility] EMERGENCY DEPARTMENT Provider Note  CSN: [id number] Arrival date & time: 05/05/18  0418  History   Chief Complaint Chief Complaint  Patient presents with  . Near Syncope    HPI Diamond Cook is a 32 y.o. female with a medical history of HTN, anemia, vertigo and anxiety who presented to the ED for lightheadedness. Patient states she had 1 episode of lightheadedness yesterday 05/04/18 at approx. 5pm that lasted ~30 seconds. She states that she was at home and she was moving from sitting down to standing. No head trauma, falls or LOC. Denies any symptoms during this episode, including chest pain, SOB, vision changes, headache or palpitations. She endorses history of anemia and has been without her iron pills for a couple of months. Denies hematochezia/melena, hematemesis, vaginal bleeding or bleeding from other sites. Has history of vertigo which states is intermittent and is able to manage on her own.  Additional history obtained by medical chart. Patient has history of iron deficiency anemia with her baseline Hgb ~ 11-11.5.  Past Medical History:  Diagnosis Date  . Anemia   . Anxiety    no meds  . Depression    no meds  . History of blood transfusion 2013   In [place]  . Hypertension   . Obesity   . Vertigo     Patient Active Problem List   Diagnosis Date Noted  . Menorrhagia with irregular cycle 12/17/2017  . Morbid obesity (HCC) 12/17/2017  . Iron deficiency anemia 12/17/2017  . Benign essential hypertension 12/17/2017    Past Surgical History:  Procedure Laterality Date  . CESAREAN SECTION  2004,2005,2012   x 3 in [place]  . DILATION AND CURETTAGE OF UTERUS N/A 02/24/2018   Procedure: DILATATION AND CURETTAGE;  Surgeon: Reva Bores, MD;  Location: [facility] ORS;  Service: Gynecology;  Laterality: N/A;  . INTRAUTERINE DEVICE (IUD) INSERTION N/A 02/24/2018   Procedure: INTRAUTERINE DEVICE (IUD) INSERTION;  Surgeon: Reva Bores, MD;  Location: [facility]  ORS;  Service: Gynecology;  Laterality: N/A;  . NOVASURE ABLATION     In [place]  . TUBAL LIGATION     in [place]     OB History    Gravida  3   Para  3   Term  3   Preterm      AB      Living  3     SAB      TAB      Ectopic      Multiple      Live Births  3            Home Medications    Prior to Admission medications   Medication Sig Start Date End Date Taking? Authorizing Provider  hydrochlorothiazide (HYDRODIURIL) 25 MG tablet Take 1 tablet (25 mg total) by mouth daily. 12/17/17  Yes Reva Bores, MD  ferrous sulfate 325 (65 FE) MG tablet Take 1 tablet (325 mg total) by mouth daily. 05/05/18 06/04/18  Tykel Badie, Jerrel Ivory I, PA-C  ibuprofen (ADVIL,MOTRIN) 800 MG tablet Take 1 tablet (800 mg total) by mouth every 8 (eight) hours as needed. Patient not taking: Reported on 05/05/2018 02/24/18   Reva Bores, MD  meclizine (ANTIVERT) 25 MG tablet Take 1 tablet (25 mg total) every 8 (eight) hours as needed by mouth for dizziness. Patient not taking: Reported on 05/05/2018 09/23/17   Mardella Layman, MD    Family History Family History  Problem Relation Age of  Onset  . Diabetes Maternal Grandmother   . Hypertension Maternal Grandmother   . Diabetes Maternal Aunt   . Hypertension Maternal Aunt   . Diabetes Maternal Aunt   . Hypertension Maternal Aunt     Social History Social History   Tobacco Use  . Smoking status: Never Smoker  . Smokeless tobacco: Never Used  Substance Use Topics  . Alcohol use: No  . Drug use: No     Allergies   Patient has no known allergies.   Review of Systems Review of Systems  Respiratory: Negative.   Cardiovascular: Negative.   Gastrointestinal: Negative.   Genitourinary: Negative.   Neurological: Positive for light-headedness. Negative for syncope, speech difficulty, weakness and numbness.  Psychiatric/Behavioral: Negative for decreased concentration. The patient is nervous/anxious.      Physical Exam Updated  Vital Signs BP 107/71   Pulse 77   Temp 98.1 F (36.7 C) (Oral)   Resp 20   LMP 05/02/2018   SpO2 100%   Physical Exam  Constitutional: No distress.  Obese.   HENT:  Right Ear: External ear normal.  Left Ear: External ear normal.  Mouth/Throat: Oropharynx is clear and moist.  Eyes: Pupils are equal, round, and reactive to light. Conjunctivae and EOM are normal.  Neck: Normal range of motion. Neck supple. Normal carotid pulses present. Carotid bruit is not present.  Cardiovascular: Normal rate, regular rhythm, normal heart sounds and intact distal pulses.  Pulmonary/Chest: Effort normal and breath sounds normal.  Neurological: She is alert. She has normal strength. No cranial nerve deficit or sensory deficit. She exhibits normal muscle tone. Coordination normal.  Nursing note and vitals reviewed.    ED Treatments / Results  Labs (all labs ordered are listed, but only abnormal results are displayed) Labs Reviewed  BASIC METABOLIC PANEL - Abnormal; Notable for the following components:      Result Value   Glucose, Bld 119 (*)    Calcium 8.8 (*)    All other components within normal limits  CBC - Abnormal; Notable for the following components:   Hemoglobin 11.3 (*)    MCH 24.8 (*)    All other components within normal limits  URINALYSIS, ROUTINE W REFLEX MICROSCOPIC - Abnormal; Notable for the following components:   Color, Urine STRAW (*)    Hgb urine dipstick SMALL (*)    All other components within normal limits  CBG MONITORING, ED  I-STAT BETA HCG BLOOD, ED ([facility], [facility], [facility] ONLY)  I-STAT TROPONIN, ED    EKG None  Radiology No results found.  Procedures Procedures (including critical care time)  Medications Ordered in ED Medications - No data to display   Initial Impression / Assessment and Plan / ED Course  Triage vital signs and the nursing notes have been reviewed.  Pertinent labs & imaging results that were available during care of the patient were  reviewed and considered in medical decision making (see chart for details).   Clinical Course as of May 05 728  Wed May 05, 2018  0605 EKG showed NSR. No ST elevations/depressions or signs of acute ischemia or infarct. This is reassuring in combination with negative troponin which assists in evaluating and ruling out an acute cardiac process.   [GM]  [phone] Orthostatics completed and she is not orthostatic today. Lying: BP 104/62, HR 81 Sit: BP 104/63, HR 77 Stand: BP 118/73, HR 84   [GM]    Clinical Course User Index [GM] Windy Carina, PA-C   Patient presents  in no acute distress and is lying calmly in the bed. Her vitals have been normal throughout the ED visit. Physical exam and history are reassuring. Patient is asymptomatic and presents without any cardiac, pulmonary or neuro deficits. HEART score of 2. History of anemia with low Hgb today, but is consistent with pt's baseline lab results. No s/s of acute blood loss that require additional evaluation today. Lightheadedness likely orthostatic event x1 given history and symptoms acute resolution without any return since then.   Final Clinical Impressions(s) / ED Diagnoses  1. Lightheadedness. Likely orthostatic event. Education provided on orthostatic hypotension.   Dispo: Home. After thorough clinical evaluation, this patient is determined to be medically stable and can be safely discharged with the previously mentioned treatment and/or outpatient follow-up/referral(s). At this time, there are no other apparent medical conditions that require further screening, evaluation or treatment.   Final diagnoses:  Lightheadedness    ED Discharge Orders        Ordered    ferrous sulfate 325 (65 FE) MG tablet  Daily     05/05/18 0656       Windy CarinaMortis, Linh Johannes I, PA-C 05/05/18 [id number]    Shaune PollackIsaacs, Cameron, MD 05/05/18 1029

## 2018-05-05 NOTE — Discharge Instructions (Signed)
I have written you a new prescription for your iron supplements. You can follow-up with your PCP or OB/Gyn for refills or you can use OTC supplements.  Continue your BP medications as prescribed. Follow-up with your PCP for further discussions about vertigo and future lightheaded episodes.  Reasons to return to emergency room Feeling lightheaded plus 1 or more of the following symptoms: change in speech, facial droop, vision changes, worst headache of your life, numbness in your arms/legs, difficulty with balance or walking.

## 2018-05-05 NOTE — ED Notes (Signed)
Pt alert, Call light within reach VS documented

## 2018-07-22 ENCOUNTER — Emergency Department (HOSPITAL_COMMUNITY)
Admission: EM | Admit: 2018-07-22 | Discharge: 2018-07-22 | Disposition: A | Discharge status: Home / Self Care | Payer: Medicaid Other | Attending: Emergency Medicine | Admitting: Emergency Medicine

## 2018-07-22 ENCOUNTER — Encounter (HOSPITAL_COMMUNITY): Payer: Self-pay | Admitting: Emergency Medicine

## 2018-07-22 DIAGNOSIS — I10 Essential (primary) hypertension: Secondary | ICD-10-CM | POA: Diagnosis not present

## 2018-07-22 DIAGNOSIS — R42 Dizziness and giddiness: Secondary | ICD-10-CM

## 2018-07-22 DIAGNOSIS — Z79899 Other long term (current) drug therapy: Secondary | ICD-10-CM | POA: Insufficient documentation

## 2018-07-22 LAB — URINALYSIS, ROUTINE W REFLEX MICROSCOPIC
BILIRUBIN URINE: NEGATIVE
GLUCOSE, UA: NEGATIVE mg/dL
Hgb urine dipstick: NEGATIVE
KETONES UR: 5 mg/dL — AB
Leukocytes, UA: NEGATIVE
Nitrite: NEGATIVE
PH: 6 (ref 5.0–8.0)
Protein, ur: NEGATIVE mg/dL
Specific Gravity, Urine: 1.014 (ref 1.005–1.030)

## 2018-07-22 LAB — CBC
HCT: 37.8 % (ref 36.0–46.0)
HEMOGLOBIN: 11.7 g/dL — AB (ref 12.0–15.0)
MCH: 25.2 pg — ABNORMAL LOW (ref 26.0–34.0)
MCHC: 31 g/dL (ref 30.0–36.0)
MCV: 81.3 fL (ref 78.0–100.0)
Platelets: 209 10*3/uL (ref 150–400)
RBC: 4.65 MIL/uL (ref 3.87–5.11)
RDW: 15.4 % (ref 11.5–15.5)
WBC: 5.7 10*3/uL (ref 4.0–10.5)

## 2018-07-22 LAB — BASIC METABOLIC PANEL
ANION GAP: 10 (ref 5–15)
BUN: 11 mg/dL (ref 6–20)
CALCIUM: 9.2 mg/dL (ref 8.9–10.3)
CO2: 26 mmol/L (ref 22–32)
Chloride: 103 mmol/L (ref 98–111)
Creatinine, Ser: 1.1 mg/dL — ABNORMAL HIGH (ref 0.44–1.00)
GFR calc Af Amer: >60 mL/min (ref >60)
GLUCOSE: 92 mg/dL (ref 70–99)
Potassium: 3.4 mmol/L — ABNORMAL LOW (ref 3.5–5.1)
Sodium: 139 mmol/L (ref 135–145)

## 2018-07-22 LAB — I-STAT BETA HCG BLOOD, ED (MC, WL, AP ONLY): I-stat hCG, quantitative: <5 m[IU]/mL (ref <5)

## 2018-07-22 MED ORDER — SODIUM CHLORIDE 0.9 % IV BOLUS: 1000.0000 mL | Freq: Once | INTRAVENOUS | Status: DC

## 2018-07-22 NOTE — ED Provider Notes (Signed)
Patient placed in Quick Look pathway, seen and evaluated   Chief Complaint: light-headedness   HPI:   Light-headedness described as "feeling like i'm going to pass out" onset today around 4 pm when she went from laying to sitting. Brief, intermittent, worse with sitting.  H/o vertigo. Her friend died of arrhythmia and she is afraid of this. Non compliant with iron pills for anemia.   ROS: positive: visual disturbance ("eyes crossed" for a couple of seconds), palpitations yesterday.  Negative HA, nausea, vomiting, CP, SOB.    Physical Exam:   Gen: No distress  Neuro: Awake and Alert  Skin: Warm    Focused Exam: RRR. Lungs CTAB. Ambulatory to bathroom without symptoms.  Speech is fluent without obvious dysarthria or dysphasia. Strength 5/5 with hand grip and ankle F/E.   Sensation to light touch intact in hands and feet. Normal gait. No pronator drift. No leg drop.  Normal finger-to-nose and finger tapping.   CN I and VIII not tested. CN II-XII grossly intact bilaterally.   Initiation of care has begun. The patient has been counseled on the process, plan, and necessity for staying for the completion/evaluation, and the remainder of the medical screening examination    Jerrell Mylar 07/22/18 Rhina Brackett, MD 07/22/18 2249

## 2018-07-22 NOTE — ED Triage Notes (Addendum)
Pt presents with dizziness/lightheadedness with position change this afternoon; pt states at 4pm she sat forward in her chair and got dizzy and felt like she was going to pass out, denies LOC; denies headache, denies CP; no neuro deficits noted at this time

## 2018-07-22 NOTE — ED Provider Notes (Addendum)
[facility] EMERGENCY DEPARTMENT Provider Note   CSN: [id number] Arrival date & time: 07/22/18  1756     History   Chief Complaint Chief Complaint  Patient presents with  . Dizziness  . Visual Field Change    HPI Diamond Cook is a 32 y.o. female presenting for 2 episodes of lightheadedness that occurred today approximately 4 PM.  Patient states during both episodes she was leaning back in her chair and sat up quickly followed by a 3-5 seconds of lightheadedness.  Patient states that during these few seconds she felt like her eyes were crossing and became dizzy however states that all symptoms resolved quickly.  Of note patient states that she has been on the keto diet 1 week ago and has also been practicing intermittent fasting.  Patient states that she has lost 7 pounds in the past week.  Patient denies nausea, vomiting or diarrhea, history of fever, recent illness.  Patient denies history of heart disease or arrhythmia.  Patient denies family history of sudden cardiac death under the age of 33.  Patient denies chest pain, palpitations, shortness of breath, leg swelling or history of blood clots.  Patient denies history of cancer or recent surgeries or immobilizations.  Patient denies oral contraceptive use.  HPI  Past Medical History:  Diagnosis Date  . Anemia   . Anxiety    no meds  . Depression    no meds  . History of blood transfusion 2013   In [place]  . Hypertension   . Obesity   . Vertigo     Patient Active Problem List   Diagnosis Date Noted  . Menorrhagia with irregular cycle 12/17/2017  . Morbid obesity (HCC) 12/17/2017  . Iron deficiency anemia 12/17/2017  . Benign essential hypertension 12/17/2017    Past Surgical History:  Procedure Laterality Date  . CESAREAN SECTION  2004,2005,2012   x 3 in [place]  . DILATION AND CURETTAGE OF UTERUS N/A 02/24/2018   Procedure: DILATATION AND CURETTAGE;  Surgeon: Reva Bores, MD;  Location: [facility]  ORS;  Service: Gynecology;  Laterality: N/A;  . INTRAUTERINE DEVICE (IUD) INSERTION N/A 02/24/2018   Procedure: INTRAUTERINE DEVICE (IUD) INSERTION;  Surgeon: Reva Bores, MD;  Location: [facility] ORS;  Service: Gynecology;  Laterality: N/A;  . NOVASURE ABLATION     In [place]  . TUBAL LIGATION     in [place]     OB History    Gravida  3   Para  3   Term  3   Preterm      AB      Living  3     SAB      TAB      Ectopic      Multiple      Live Births  3            Home Medications    Prior to Admission medications   Medication Sig Start Date End Date Taking? Authorizing Provider  hydrochlorothiazide (HYDRODIURIL) 25 MG tablet Take 1 tablet (25 mg total) by mouth daily. 12/17/17  Yes Reva Bores, MD  ferrous sulfate 325 (65 FE) MG tablet Take 1 tablet (325 mg total) by mouth daily. 05/05/18 06/04/18  Mortis, Jerrel Ivory I, PA-C  ibuprofen (ADVIL,MOTRIN) 800 MG tablet Take 1 tablet (800 mg total) by mouth every 8 (eight) hours as needed. Patient not taking: Reported on 05/05/2018 02/24/18   Reva Bores, MD  meclizine (ANTIVERT) 25 MG  tablet Take 1 tablet (25 mg total) every 8 (eight) hours as needed by mouth for dizziness. Patient not taking: Reported on 05/05/2018 09/23/17   Mardella Layman, MD    Family History Family History  Problem Relation Age of Onset  . Diabetes Maternal Grandmother   . Hypertension Maternal Grandmother   . Diabetes Maternal Aunt   . Hypertension Maternal Aunt   . Diabetes Maternal Aunt   . Hypertension Maternal Aunt     Social History Social History   Tobacco Use  . Smoking status: Never Smoker  . Smokeless tobacco: Never Used  Substance Use Topics  . Alcohol use: No  . Drug use: No     Allergies   Patient has no known allergies.   Review of Systems Review of Systems  Constitutional: Negative.  Negative for chills and fever.  HENT: Negative.  Negative for rhinorrhea and sore throat.   Eyes: Positive for visual  disturbance. Negative for pain.  Respiratory: Negative.  Negative for cough and shortness of breath.   Cardiovascular: Negative.  Negative for chest pain, palpitations and leg swelling.  Gastrointestinal: Negative.  Negative for abdominal pain, blood in stool, diarrhea, nausea and vomiting.  Genitourinary: Negative.  Negative for dysuria, hematuria, vaginal bleeding and vaginal discharge.  Musculoskeletal: Negative.  Negative for arthralgias, myalgias and neck pain.  Skin: Negative.  Negative for rash.  Neurological: Positive for light-headedness. Negative for syncope, weakness and headaches.     Physical Exam Updated Vital Signs BP 125/86   Pulse 83   Temp 98.2 F (36.8 C) (Oral)   Resp 16   Ht 5\' 7"  (1.702 m)   Wt (!) 143.8 kg   SpO2 100%   BMI 49.65 kg/m   Physical Exam  Constitutional: She is oriented to person, place, and time. She appears well-developed and well-nourished. No distress.  HENT:  Head: Normocephalic and atraumatic.  Right Ear: External ear normal.  Left Ear: External ear normal.  Nose: Nose normal.  Eyes: Pupils are equal, round, and reactive to light. Conjunctivae and EOM are normal.  Neck: Trachea normal and normal range of motion. No tracheal deviation present.  Cardiovascular: Normal rate, regular rhythm, normal heart sounds and intact distal pulses.  Pulses:      Dorsalis pedis pulses are 2+ on the right side, and 2+ on the left side.       Posterior tibial pulses are 2+ on the right side, and 2+ on the left side.  Pulmonary/Chest: Effort normal. No respiratory distress.  Abdominal: Soft. There is no tenderness. There is no rebound and no guarding.  Musculoskeletal: Normal range of motion.       Right ankle: Normal.       Left ankle: Normal.       Right lower leg: Normal.       Left lower leg: Normal.       Right foot: Normal.       Left foot: Normal.  Feet:  Right Foot:  Protective Sensation: 3 sites tested. 3 sites sensed.  Left Foot:    Protective Sensation: 3 sites tested. 3 sites sensed.  Neurological: She is alert and oriented to person, place, and time. She has normal strength. No cranial nerve deficit or sensory deficit. GCS eye subscore is 4. GCS verbal subscore is 5. GCS motor subscore is 6.  Mental Status: Alert, oriented, thought content appropriate, able to give a coherent history. Speech fluent without evidence of aphasia. Able to follow 2 step commands without difficulty.  Cranial Nerves: II: Peripheral visual fields grossly normal, pupils equal, round, reactive to light III,IV, VI: ptosis not present, extra-ocular motions intact bilaterally V,VII: smile symmetric, eyebrows raise symmetric, facial light touch sensation equal VIII: hearing grossly normal to voice X: uvula elevates symmetrically XI: bilateral shoulder shrug symmetric and strong XII: midline tongue extension without fassiculations Motor: Normal tone. 5/5 strength in upper and lower extremities bilaterally including strong and equal grip strength and dorsiflexion/plantar flexion Sensory: Sensation intact to light touch in all extremities. Cerebellar: normal finger-to-nose with bilateral upper extremities. Normal heel-to -shin balance bilaterally of the lower extremity. No pronator drift.  CV: distal pulses palpable throughout  Skin: Skin is warm and dry. Capillary refill takes less than 2 seconds.  Psychiatric: She has a normal mood and affect. Her behavior is normal.     ED Treatments / Results  Labs (all labs ordered are listed, but only abnormal results are displayed) Labs Reviewed  BASIC METABOLIC PANEL - Abnormal; Notable for the following components:      Result Value   Potassium 3.4 (*)    Creatinine, Ser 1.10 (*)    All other components within normal limits  CBC - Abnormal; Notable for the following components:   Hemoglobin 11.7 (*)    MCH 25.2 (*)    All other components within normal limits  URINALYSIS, ROUTINE W REFLEX  MICROSCOPIC - Abnormal; Notable for the following components:   Ketones, ur 5 (*)    All other components within normal limits  I-STAT BETA HCG BLOOD, ED ([facility], [facility], AP ONLY)  CBG MONITORING, ED    EKG EKG Interpretation  Date/Time:  Thursday July 22 2018 18:39:54 EDT Ventricular Rate:  80 PR Interval:  164 QRS Duration: 90 QT Interval:  374 QTC Calculation: 431 R Axis:   72 Text Interpretation:  Normal sinus rhythm Nonspecific T wave abnormality Confirmed by Cathren Laine ([id number]) on 07/22/2018 10:01:38 PM   Radiology No results found.  Procedures Procedures (including critical care time)  Medications Ordered in ED Medications  sodium chloride 0.9 % bolus 1,000 mL (1,000 mLs Intravenous Refused 07/22/18 2225)     Initial Impression / Assessment and Plan / ED Course  I have reviewed the triage vital signs and the nursing notes.  Pertinent labs & imaging results that were available during my care of the patient were reviewed by me and considered in my medical decision making (see chart for details).  Clinical Course as of Jul 23 46  Thu Jul 22, 2018  2205 Patient has refused IV fluids, states that she wishes to pursue oral rehydration instead.   [BM]    Clinical Course User Index [BM] Bill Salinas, PA-C   Patient presenting after 2 brief episodes of lightheadedness lasting approximately 3 to 5 seconds and occurred after sitting up suddenly.  CBC nonacute BMP shows creatinine of 1.1 patient refused IV fluids states she wishes to oral rehydration Urinalysis nonacute Beta hCG negative EKG without acute abnormalities Neuro exam without abnormality  Patient recently began the keto diet and intermittent fasting.  Patient encouraged to drink plenty of water and eat enough food to avoid dizziness.  Patient denies recurrence of lightheadedness here in emergency department.  Patient refused IV fluids.  Patient states that she is feeling well, no acute distress or pain  at this time.  Patient requesting discharge.  Patient case discussed with Dr. Meredith Mody who agrees with discharge and outpatient follow-up.  Patient denies history of arrhythmia or heart disease, denies family  history of sudden cardiac death under the age of 78.  Patient informed to return to the emergency department for return of symptoms or new or worsening symptoms.  At this time there does not appear to be any evidence of an acute emergency medical condition and the patient appears stable for discharge with appropriate outpatient follow up. Diagnosis was discussed with patient who verbalizes understanding of care plan and is agreeable to discharge. I have discussed return precautions with patient and family at bedside who verbalize understanding of return precautions. Patient strongly encouraged to follow-up with their PCP. All questions answered.  Patient's case discussed with Dr. Denton Lank who agrees with plan to discharge with follow-up.     Note: Portions of this report may have been transcribed using voice recognition software. Every effort was made to ensure accuracy; however, inadvertent computerized transcription errors may still be present.  Final Clinical Impressions(s) / ED Diagnoses   Final diagnoses:  Lightheadedness    ED Discharge Orders    None       Bill Salinas, PA-C 07/23/18 0052    Bill Salinas, PA-C 07/23/18 1610    Cathren Laine, MD 07/23/18 [phone]

## 2018-07-22 NOTE — Discharge Instructions (Signed)
Please return to the Emergency Department for any new or worsening symptoms or if your symptoms do not improve. Please be sure to follow up with your Primary Care Physician as soon as possible regarding your visit today. If you do not have a Primary Doctor please use the resources below to establish one. Please drink plenty of water and eat enough food to avoid dizziness and lightheadedness.  Please return to emergency department for any new or worsening symptoms or if your symptoms return. Your lab work showed some increased kidney function which could reflect dehydration, you have refused IV fluids today.  Please be sure to drink plenty of water to avoid dehydration.  Contact a health care provider if: Your dizziness does not go away. Your dizziness or light-headedness gets worse. You feel nauseous. You have reduced hearing. You have new symptoms. You are unsteady on your feet or you feel like the room is spinning. Get help right away if: You vomit or have diarrhea and are unable to eat or drink anything. You have problems talking, walking, swallowing, or using your arms, hands, or legs. You feel generally weak. You are not thinking clearly or you have trouble forming sentences. It may take a friend or family member to notice this. You have chest pain, abdominal pain, shortness of breath, or sweating. Your vision changes. You have any bleeding. You have a severe headache. You have neck pain or a stiff neck. You have a fever. Get help right away if: You have a severe headache. You have unusual pain in your chest, abdomen, or back. You are bleeding from your mouth or rectum, or you have black or tarry stool. You have a very fast or irregular heartbeat (palpitations). You faint once or repeatedly. You have a seizure. You are confused. You have trouble walking. You have severe weakness. You have vision problems.  RESOURCE GUIDE  Chronic Pain Problems: Contact [facility]  [phone] Patients need to be referred by their primary care doctor.  Insufficient Money for Medicine: Contact United Way:  call "211" or [facility] [phone].  No Primary Care Doctor: Call [facility]  [phone] - can help you locate a primary care doctor that  accepts your insurance, provides certain services, etc. Physician Referral Service- [phone]  Agencies that provide inexpensive medical care: [facility]  [phone] [facility] Internal Medicine  [phone] [facility]  [phone] [facility]  [phone] Planned [facility]  [phone] [address] [facility] Child Clinic  [phone]  Medicaid-accepting [facility] Providers: [facility]- [address]  [phone], Mon-Fri 9am-7pm, Sat 9am-1pm [facility]- [address] [place], Suite [place]  [phone] [facility]- [address], [address] [place]  [phone] [facility]- [address]  [phone] Renaye Rakers- [address] [place], [address], [phone]  Only accepts [place] patients after they have their name  applied to their card  Self Pay (no insurance) in [facility]: Sickle Cell Patients: Dr Willey Blade, [facility]  [address] [place], [phone] [facility] Urgent Care- [address] [place]  [phone]       [facility] [place]- [address]       -     [facility]- see information above (Speak to [employer] if you do not have insurance)       -  [facility]- [address] [place], [phone]       -  [facility]- [address]  [phone]       -  [facility]- [address], [phone]       -  Dr Julio Sicks-  [address], [place], [phone]       -  [facility]gent Care- [address], [phone]       -  [facility]- [address], [phone], also [address], [phone]       -    [facility]- [address] [place], [phone], 1st & 3rd Saturday   every month, 10am-1pm  1) Find a Doctor and Pay Out of Pocket Although you won't have to find out who is covered by your insurance plan, it is a good idea to ask around and get recommendations. You will then need to call the office and see if the doctor you have chosen will accept you as a new patient and what types of options they offer for patients who are self-pay. Some doctors offer discounts or will set up payment plans for their patients who do not have insurance, but you will need to ask so you aren't surprised when you get to your appointment.  2) Contact Your Local Health Department Not all health departments have doctors that can see patients for sick visits, but many do, so it is worth a call to see if yours does. If you don't know where your local health department is, you can check in your phone book. The CDC also has a tool to help you locate your state's health department, and many state websites also have listings of all of their local health departments.  3) Find a Walk-in Clinic If your illness is not likely to be very severe or complicated, you may want to try a walk in clinic. These are popping up all over the country in pharmacies, drugstores, and shopping centers. They're usually staffed by nurse practitioners or physician assistants that have been trained to treat common illnesses and complaints. They're usually fairly quick and inexpensive. However, if you have serious medical issues or chronic medical problems, these are probably not your best option  STD Testing [facility] Department of [facility], STD Clinic, [address], [place], phone [phone] or [phone].  Monday - Friday, call for an appointment. [facility] Department of [employer], STD Clinic, [place] [address], [place], phone [phone] or [phone].   Monday - Friday, call for an appointment.  Abuse/Neglect: [facility] Child Abuse Hotline 380-039-6570 [facility] Child Abuse Hotline 859-041-2153 (After Hours)  Emergency Shelter:  [facility] 641-052-0556  Maternity Homes: Room at the [place] [facility] (541)690-3175 [facility] (267) [phone]  MRSA Hotline #:   986 311 9423  [facility] Resources  [facility] [place]  United Way [facility] Dept. [address]                 [address]         [address] [place] [address]  [address]  Cristobal Goldmann Phone:  [phone]                                  Phone:  [phone]                   Phone:  [phone]  [facility], [phone] [facility] [place]- [phone]       -     [facility] in [place], [address],                                  [phone], [facility] Child Abuse Hotline 361 327 0192 or 909-479-7093 (After Hours)   Behavioral Health Services  Substance Abuse Resources: [facility]  (727)343-8058 [facility] 331-754-4416 [place] 8251625316 Floydene Flock [phone] Residential & Outpatient Substance Abuse Program  (343)167-7295  Psychological Services: [facility] Health  [phone] [facility]  [phone] [facility], [phone] [place]. [address] [place], ACCESS LINE: 623-002-7531 or 4425690568, [employer]  Dental Assistance  If unable to pay or uninsured, contact:  [facility] or [facility]. to become qualified for the adult dental clinic.  Patients with Medicaid: [facility] [phone] [address], [phone] [address] [phone]  If unable to  pay, or uninsured, contact [facility] (262)021-3007) or [employer] Department [phone] in [place], [phone] in [facility]) to become qualified for the adult dental clinic   Other Low-Cost Community Dental Services: [facility]- [address] [place], [place], [place], [address], [phone], Ext. [phone], 2nd and 4th Thursday of the month at 6:30am.  10 clients each day by appointment, can sometimes see walk-in patients if someone does not show for an appointment. [facility]- [address] [place], [place], [address], [phone] [facility] [address], [place], [place], [address], [phone] [facility] Department- [phone] [facility] Health Department- [phone] [facility] [phone]

## 2018-07-24 ENCOUNTER — Encounter (HOSPITAL_COMMUNITY): Payer: Self-pay

## 2018-07-24 ENCOUNTER — Emergency Department (HOSPITAL_COMMUNITY)
Admission: EM | Admit: 2018-07-24 | Discharge: 2018-07-24 | Disposition: A | Discharge status: Home / Self Care | Payer: Medicaid Other | Attending: Emergency Medicine | Admitting: Emergency Medicine

## 2018-07-24 ENCOUNTER — Other Ambulatory Visit: Payer: Self-pay

## 2018-07-24 DIAGNOSIS — R002 Palpitations: Secondary | ICD-10-CM

## 2018-07-24 DIAGNOSIS — Z79899 Other long term (current) drug therapy: Secondary | ICD-10-CM | POA: Diagnosis not present

## 2018-07-24 DIAGNOSIS — R9431 Abnormal electrocardiogram [ECG] [EKG]: Secondary | ICD-10-CM | POA: Insufficient documentation

## 2018-07-24 DIAGNOSIS — I10 Essential (primary) hypertension: Secondary | ICD-10-CM | POA: Diagnosis not present

## 2018-07-24 DIAGNOSIS — Z712 Person consulting for explanation of examination or test findings: Secondary | ICD-10-CM

## 2018-07-24 NOTE — Discharge Instructions (Signed)
Thank you for allowing me to care for you today in the Emergency Department.   I would calling the number on your discharge paperwork to get established with a primary care provider.A primary care provider can order all of the recommended screening exams, such as checking your triglyceride levels.  Since you have Medicaid, they can find someone that can take your insurance.  You can call the cardiology office if you continue to have palpitations to inquire about getting set up for a Holter monitor.  Since you have been feeling lightheaded and having a headache, I would recommend starting a diary and keeping track of your symptoms.  They are most likely related to your new diet that you started.  You could try stopping the diet for a couple of weeks to see if your symptoms get better.  You should return to the emergency department if you develop double vision where you are seeing 2 of things, high fever, if your neck becomes rigid and unable to move, if you develop persistent vomiting with a headache, or other new, concerning symptoms.

## 2018-07-24 NOTE — ED Provider Notes (Signed)
[facility] EMERGENCY DEPARTMENT Provider Note   CSN: [id number] Arrival date & time: 07/24/18  1130     History   Chief Complaint Chief Complaint  Patient presents with  . Abnormal Lab    HPI Diamond Cook is a 32 y.o. female with a history of anxiety, vertigo, obesity, and hypertension who presents to the emergency department with a chief complaint of "My EKG on MyChart said it was abnormal."  The patient reports that she was seen and evaluated on 07/22/2018 in the emergency department for lightheadedness.  She reports that she has been on a keto diet for the last week and is also been practicing intermittent fasting.  She has lost 7 pounds in the last week.  She states that she was concerned because there was that anything was wrong with her EKG, but on nitrates that was abnormal.  She reports that she is also been having a bilateral frontal pressure-like headache with the last few days.  She is also inquiring about how to get her weight checked, her triglycerides checked, chronic palpitations that have been intermittent for the last few yearsand if she can get a medication for her chronic tinnitus.  She has no new complaints that she was evaluated on 07/22/2018.  She is not having any chest pain, shortness of breath, weakness, numbness, diplopia, or syncope.  She has continued with her diet and intermittent fasting since she was evaluated 2 days ago.  She has not established with a primary care provider.  She has also started taking a new supplement called liver focus and has questions about ingredients.  The history is provided by the patient. No language interpreter was used.    Past Medical History:  Diagnosis Date  . Anemia   . Anxiety    no meds  . Depression    no meds  . History of blood transfusion 2013   In [place]  . Hypertension   . Obesity   . Vertigo     Patient Active Problem List   Diagnosis Date Noted  . Menorrhagia with irregular  cycle 12/17/2017  . Morbid obesity (HCC) 12/17/2017  . Iron deficiency anemia 12/17/2017  . Benign essential hypertension 12/17/2017    Past Surgical History:  Procedure Laterality Date  . CESAREAN SECTION  2004,2005,2012   x 3 in [place]  . DILATION AND CURETTAGE OF UTERUS N/A 02/24/2018   Procedure: DILATATION AND CURETTAGE;  Surgeon: Reva Bores, MD;  Location: [facility];  Service: Gynecology;  Laterality: N/A;  . INTRAUTERINE DEVICE (IUD) INSERTION N/A 02/24/2018   Procedure: INTRAUTERINE DEVICE (IUD) INSERTION;  Surgeon: Reva Bores, MD;  Location: [facility];  Service: Gynecology;  Laterality: N/A;  . NOVASURE ABLATION     In [place]  . TUBAL LIGATION     in [place]     OB History    Gravida  3   Para  3   Term  3   Preterm      AB      Living  3     SAB      TAB      Ectopic      Multiple      Live Births  3            Home Medications    Prior to Admission medications   Medication Sig Start Date End Date Taking? Authorizing Provider  ferrous sulfate 325 (65 FE) MG tablet Take 1 tablet (325  mg total) by mouth daily. 05/05/18 06/04/18  Mortis, Jerrel Ivory I, PA-C  hydrochlorothiazide (HYDRODIURIL) 25 MG tablet Take 1 tablet (25 mg total) by mouth daily. 12/17/17   Reva Bores, MD    Family History Family History  Problem Relation Age of Onset  . Diabetes Maternal Grandmother   . Hypertension Maternal Grandmother   . Diabetes Maternal Aunt   . Hypertension Maternal Aunt   . Diabetes Maternal Aunt   . Hypertension Maternal Aunt     Social History Social History   Tobacco Use  . Smoking status: Never Smoker  . Smokeless tobacco: Never Used  Substance Use Topics  . Alcohol use: No  . Drug use: No     Allergies   Patient has no known allergies.   Review of Systems Review of Systems  Constitutional: Negative for activity change, chills and fever.  Respiratory: Negative for shortness of breath.   Cardiovascular: Positive for  palpitations. Negative for chest pain.  Gastrointestinal: Negative for abdominal pain.  Genitourinary: Negative for dysuria.  Musculoskeletal: Negative for back pain.  Skin: Negative for rash.  Allergic/Immunologic: Negative for immunocompromised state.  Neurological: Positive for light-headedness and headaches. Negative for dizziness, seizures and weakness.  Psychiatric/Behavioral: Negative for confusion.     Physical Exam Updated Vital Signs BP 132/90 (BP Location: Right Arm)   Pulse 95   Temp 98.7 F (37.1 C) (Oral)   Resp 16   SpO2 100%   Physical Exam  Constitutional: No distress.  HENT:  Head: Normocephalic.  Right Ear: Hearing, tympanic membrane, external ear and ear canal normal.  Left Ear: Hearing, tympanic membrane, external ear and ear canal normal.  Eyes: Conjunctivae are normal.  Neck: Neck supple.  Cardiovascular: Normal rate, regular rhythm, normal heart sounds and intact distal pulses. Exam reveals no gallop and no friction rub.  No murmur heard. Pulmonary/Chest: Effort normal. No stridor. No respiratory distress. She has no wheezes. She has no rales. She exhibits no tenderness.  Abdominal: Soft. She exhibits no distension.  Neurological: She is alert.  Skin: Skin is warm. No rash noted.  Psychiatric: Her behavior is normal.  Nursing note and vitals reviewed.    ED Treatments / Results  Labs (all labs ordered are listed, but only abnormal results are displayed) Labs Reviewed - No data to display  EKG None  Radiology No results found.  Procedures Procedures (including critical care time)  Medications Ordered in ED Medications - No data to display   Initial Impression / Assessment and Plan / ED Course  I have reviewed the triage vital signs and the nursing notes.  Pertinent labs & imaging results that were available during my care of the patient were reviewed by me and considered in my medical decision making (see chart for details).      32 year old female with a history of anxiety, vertigo, obesity, and hypertension presenting to the ED after she checked her MyChart account and saw that her EKG was abnormal.  EKG from 07/22/2018 with nonspecific T wave abnormalities, which are unchanged from previous.  She also has multiple questions regarding her overall health, which is reassuring.  She is not established with a primary care provider.  Lengthy educational conversation with the patient and we reviewed her old EKGs together.  She states that she has been having palpitations for some time and has requested a referral to cardiology for Holter monitor.  This has been provided.  She is requesting to have her weight and her triglycerides checked.  She has weaned herself that she has been in the ED.  Discussed that having a primary care provider would be extremely beneficial for her as it sounds as if she is really trying to take control of her health, but we do not routinely check triglycerides in the ED.  She is understanding and acknowledges this information this time.  All questions been answered.  She has no new symptoms today.  She reports that her lightheadedness has persisted, but she is also been continuing her keto diet and intermittent fasting.  I recommended that she keep a log of her symptoms and or discontinue her diet changes for a few weeks to see if her symptoms resolved.  She has been given strict return precautions to the emergency department for new or worsening symptoms.  She is hemodynamically stable and in no acute distress.  She is safe for discharge home with outpatient follow-up at this time.  Final Clinical Impressions(s) / ED Diagnoses   Final diagnoses:  Encounter to discuss test results  Palpitations    ED Discharge Orders    None       Barkley Boards, PA-C 07/24/18 1303    Tilden Fossa, MD 07/25/18 [phone]

## 2018-07-24 NOTE — ED Notes (Signed)
ED Provider at bedside. 

## 2018-07-24 NOTE — ED Notes (Signed)
Patient verbalizes understanding of discharge instructions. Opportunity for questioning and answers were provided. Armband removed by staff, pt discharged from ED ambulatory.   

## 2018-07-24 NOTE — ED Triage Notes (Signed)
Pt presents for test results. States was here on 9/5 for dizziness and checked her MyChart messages today and saw something was abnormal.

## 2018-08-05 ENCOUNTER — Emergency Department (HOSPITAL_COMMUNITY)
Admission: EM | Admit: 2018-08-05 | Discharge: 2018-08-06 | Disposition: A | Discharge status: Home / Self Care | Payer: Medicaid Other | Attending: Emergency Medicine | Admitting: Emergency Medicine

## 2018-08-05 ENCOUNTER — Other Ambulatory Visit: Payer: Self-pay

## 2018-08-05 ENCOUNTER — Emergency Department (HOSPITAL_COMMUNITY): Payer: Medicaid Other

## 2018-08-05 DIAGNOSIS — I10 Essential (primary) hypertension: Secondary | ICD-10-CM | POA: Insufficient documentation

## 2018-08-05 DIAGNOSIS — R072 Precordial pain: Secondary | ICD-10-CM | POA: Insufficient documentation

## 2018-08-05 DIAGNOSIS — F329 Major depressive disorder, single episode, unspecified: Secondary | ICD-10-CM | POA: Insufficient documentation

## 2018-08-05 DIAGNOSIS — Z79899 Other long term (current) drug therapy: Secondary | ICD-10-CM | POA: Diagnosis not present

## 2018-08-05 DIAGNOSIS — R079 Chest pain, unspecified: Secondary | ICD-10-CM | POA: Diagnosis present

## 2018-08-05 DIAGNOSIS — F419 Anxiety disorder, unspecified: Secondary | ICD-10-CM | POA: Insufficient documentation

## 2018-08-05 LAB — I-STAT TROPONIN, ED: Troponin i, poc: 0.01 ng/mL (ref 0.00–0.08)

## 2018-08-05 LAB — I-STAT BETA HCG BLOOD, ED (MC, WL, AP ONLY): I-stat hCG, quantitative: <5 m[IU]/mL (ref <5)

## 2018-08-05 NOTE — ED Triage Notes (Signed)
Pt central CP radiating to back starting yesterday. Pt reports some SHOB and nausea, no vomiting. Pt denies dizziness.

## 2018-08-06 ENCOUNTER — Emergency Department (HOSPITAL_COMMUNITY): Payer: Medicaid Other

## 2018-08-06 LAB — I-STAT TROPONIN, ED: Troponin i, poc: 0 ng/mL (ref 0.00–0.08)

## 2018-08-06 LAB — BASIC METABOLIC PANEL
Anion gap: 11 (ref 5–15)
BUN: 10 mg/dL (ref 6–20)
CHLORIDE: 102 mmol/L (ref 98–111)
CO2: 26 mmol/L (ref 22–32)
CREATININE: 0.79 mg/dL (ref 0.44–1.00)
Calcium: 9.5 mg/dL (ref 8.9–10.3)
GFR calc non Af Amer: >60 mL/min (ref >60)
Glucose, Bld: 92 mg/dL (ref 70–99)
POTASSIUM: 3.4 mmol/L — AB (ref 3.5–5.1)
Sodium: 139 mmol/L (ref 135–145)

## 2018-08-06 LAB — CBC
HCT: 38.9 % (ref 36.0–46.0)
Hemoglobin: 12.1 g/dL (ref 12.0–15.0)
MCH: 25.1 pg — ABNORMAL LOW (ref 26.0–34.0)
MCHC: 31.1 g/dL (ref 30.0–36.0)
MCV: 80.7 fL (ref 78.0–100.0)
PLATELETS: 216 10*3/uL (ref 150–400)
RBC: 4.82 MIL/uL (ref 3.87–5.11)
RDW: 14.9 % (ref 11.5–15.5)
WBC: 6 10*3/uL (ref 4.0–10.5)

## 2018-08-06 LAB — D-DIMER, QUANTITATIVE (NOT AT ARMC): D DIMER QUANT: 1.12 ug{FEU}/mL — AB (ref 0.00–0.50)

## 2018-08-06 MED ORDER — OMEPRAZOLE 20 MG PO CPDR: 20.0000 mg | DELAYED_RELEASE_CAPSULE | Freq: Every day | ORAL | 0 refills | Status: DC

## 2018-08-06 MED ORDER — IOPAMIDOL (ISOVUE-370) INJECTION 76%
INTRAVENOUS | Status: AC
  Filled 2018-08-06: qty 100

## 2018-08-06 MED ORDER — IOPAMIDOL (ISOVUE-370) INJECTION 76%
100.0000 mL | Freq: Once | INTRAVENOUS | Status: AC | PRN
  Administered 2018-08-06: 63 mL via INTRAVENOUS

## 2018-08-06 MED ORDER — GI COCKTAIL ~~LOC~~: 30.0000 mL | Freq: Once | ORAL | Status: DC

## 2018-08-06 NOTE — ED Provider Notes (Signed)
[facility] EMERGENCY DEPARTMENT Provider Note   CSN: [id number] Arrival date & time: 08/05/18  2252     History   Chief Complaint Chief Complaint  Patient presents with  . Chest Pain    HPI Diamond Cook is a 32 y.o. female.  The history is provided by the patient.  Chest Pain   This is a recurrent problem. The current episode started yesterday. Episode frequency: episodically. The problem has not changed since onset.The pain is associated with rest. The pain is present in the epigastric region. The pain is moderate. The quality of the pain is described as brief. The pain radiates to the mid back. Pertinent negatives include no abdominal pain, no diaphoresis, no nausea, no palpitations and no shortness of breath. She has tried nothing for the symptoms. The treatment provided no relief. Risk factors include obesity.  Pertinent negatives for past medical history include no aneurysm and no Marfan's syndrome.  Pertinent negatives for family medical history include: no Marfan's syndrome.  Procedure history is negative for cardiac catheterization.    Past Medical History:  Diagnosis Date  . Anemia   . Anxiety    no meds  . Depression    no meds  . History of blood transfusion 2013   In [place]  . Hypertension   . Obesity   . Vertigo     Patient Active Problem List   Diagnosis Date Noted  . Menorrhagia with irregular cycle 12/17/2017  . Morbid obesity (HCC) 12/17/2017  . Iron deficiency anemia 12/17/2017  . Benign essential hypertension 12/17/2017    Past Surgical History:  Procedure Laterality Date  . CESAREAN SECTION  2004,2005,2012   x 3 in [place]  . DILATION AND CURETTAGE OF UTERUS N/A 02/24/2018   Procedure: DILATATION AND CURETTAGE;  Surgeon: Reva Bores, MD;  Location: [facility];  Service: Gynecology;  Laterality: N/A;  . INTRAUTERINE DEVICE (IUD) INSERTION N/A 02/24/2018   Procedure: INTRAUTERINE DEVICE (IUD) INSERTION;  Surgeon: Reva Bores, MD;  Location: [facility];  Service: Gynecology;  Laterality: N/A;  . NOVASURE ABLATION     In [place]  . TUBAL LIGATION     in [place]     OB History    Gravida  3   Para  3   Term  3   Preterm      AB      Living  3     SAB      TAB      Ectopic      Multiple      Live Births  3            Home Medications    Prior to Admission medications   Medication Sig Start Date End Date Taking? Authorizing Provider  ferrous sulfate 325 (65 FE) MG tablet Take 1 tablet (325 mg total) by mouth daily. 05/05/18 08/06/26 Yes Mortis, Sharyon Medicus, PA-C  hydrochlorothiazide (HYDRODIURIL) 25 MG tablet Take 1 tablet (25 mg total) by mouth daily. 12/17/17  Yes Reva Bores, MD    Family History Family History  Problem Relation Age of Onset  . Diabetes Maternal Grandmother   . Hypertension Maternal Grandmother   . Diabetes Maternal Aunt   . Hypertension Maternal Aunt   . Diabetes Maternal Aunt   . Hypertension Maternal Aunt     Social History Social History   Tobacco Use  . Smoking status: Never Smoker  . Smokeless tobacco: Never Used  Substance Use  Topics  . Alcohol use: No  . Drug use: No     Allergies   Patient has no known allergies.   Review of Systems Review of Systems  Constitutional: Negative for diaphoresis.  Respiratory: Negative for shortness of breath.   Cardiovascular: Positive for chest pain. Negative for palpitations and leg swelling.  Gastrointestinal: Negative for abdominal pain and nausea.  All other systems reviewed and are negative.    Physical Exam Updated Vital Signs BP 116/78   Pulse 74   Temp 98.2 F (36.8 C) (Oral)   Resp 16   SpO2 100%   Physical Exam  Constitutional: She is oriented to person, place, and time. She appears well-developed and well-nourished. No distress.  HENT:  Head: Normocephalic and atraumatic.  Mouth/Throat: No oropharyngeal exudate.  Eyes: Pupils are equal, round, and reactive to light.  Conjunctivae are normal.  Neck: Normal range of motion. Neck supple.  Cardiovascular: Normal rate, regular rhythm, normal heart sounds and intact distal pulses.  Pulmonary/Chest: Effort normal and breath sounds normal. No stridor. She has no wheezes. She has no rales.  Abdominal: Soft. Bowel sounds are normal. She exhibits no mass. There is no tenderness. There is no rebound and no guarding.  Musculoskeletal: Normal range of motion.  Neurological: She is alert and oriented to person, place, and time.  Skin: Skin is warm and dry. Capillary refill takes less than 2 seconds.  Psychiatric: She has a normal mood and affect.     ED Treatments / Results  Labs (all labs ordered are listed, but only abnormal results are displayed) Results for orders placed or performed during the hospital encounter of 08/05/18  Basic metabolic panel  Result Value Ref Range   Sodium 139 135 - 145 mmol/L   Potassium 3.4 (L) 3.5 - 5.1 mmol/L   Chloride 102 98 - 111 mmol/L   CO2 26 22 - 32 mmol/L   Glucose, Bld 92 70 - 99 mg/dL   BUN 10 6 - 20 mg/dL   Creatinine, Ser 4.09 0.44 - 1.00 mg/dL   Calcium 9.5 8.9 - 81.1 mg/dL   GFR calc non Af Amer >60 >60 mL/min   GFR calc Af Amer >60 >60 mL/min   Anion gap 11 5 - 15  CBC  Result Value Ref Range   WBC 6.0 4.0 - 10.5 K/uL   RBC 4.82 3.87 - 5.11 MIL/uL   Hemoglobin 12.1 12.0 - 15.0 g/dL   HCT 91.4 78.2 - 95.6 %   MCV 80.7 78.0 - 100.0 fL   MCH 25.1 (L) 26.0 - 34.0 pg   MCHC 31.1 30.0 - 36.0 g/dL   RDW 21.3 08.6 - 57.8 %   Platelets 216 150 - 400 K/uL  D-dimer, quantitative (not at [facility])  Result Value Ref Range   D-Dimer, Quant 1.12 (H) 0.00 - 0.50 ug/mL-FEU  I-stat troponin, ED  Result Value Ref Range   Troponin i, poc 0.01 0.00 - 0.08 ng/mL   Comment 3          I-Stat beta hCG blood, ED  Result Value Ref Range   I-stat hCG, quantitative <5.0 <5 mIU/mL   Comment 3          I-stat troponin, ED  Result Value Ref Range   Troponin i, poc 0.00 0.00 - 0.08  ng/mL   Comment 3           Dg Chest 2 View  Result Date: 08/05/2018 CLINICAL DATA:  Central chest pain radiating to  the back starting yesterday. Shortness of breath and nausea. EXAM: CHEST - 2 VIEW COMPARISON:  12/08/2017 FINDINGS: The heart size and mediastinal contours are within normal limits. Both lungs are clear. The visualized skeletal structures are unremarkable. IMPRESSION: No active cardiopulmonary disease. Electronically Signed   By: Burman NievesWilliam  Stevens M.D.   On: 08/05/2018 23:50   Ct Angio Chest Pe W And/or Wo Contrast  Result Date: 08/06/2018 CLINICAL DATA:  Central chest pain radiating to the back since yesterday. Shortness of breath and nausea. EXAM: CT ANGIOGRAPHY CHEST WITH CONTRAST TECHNIQUE: Multidetector CT imaging of the chest was performed using the standard protocol during bolus administration of intravenous contrast. Multiplanar CT image reconstructions and MIPs were obtained to evaluate the vascular anatomy. CONTRAST:  63mL ISOVUE-370 IOPAMIDOL (ISOVUE-370) INJECTION 76% COMPARISON:  None. FINDINGS: Cardiovascular: Examination is technically limited by patient's body habitus. There is moderately good opacification of the central and segmental pulmonary arteries. No focal filling defects are demonstrated. No evidence of significant pulmonary embolus. Cardiac enlargement. No pericardial effusions. Normal caliber thoracic aorta. Mediastinum/Nodes: No significant lymphadenopathy in the chest. Esophagus is decompressed. Lungs/Pleura: Lungs are clear and expanded. No airspace disease or consolidation. Airways are patent. No pleural effusions. No pneumothorax. Upper Abdomen: Hepatosplenomegaly. Musculoskeletal: No chest wall abnormality. No acute or significant osseous findings. Review of the MIP images confirms the above findings. IMPRESSION: No evidence of significant pulmonary embolus. No evidence of active pulmonary disease. Electronically Signed   By: Burman NievesWilliam  Stevens M.D.   On:  08/06/2018 04:26    EKG EKG Interpretation  Date/Time:  Thursday August 05 2018 23:04:29 EDT Ventricular Rate:  82 PR Interval:    QRS Duration: 77 QT Interval:  375 QTC Calculation: 438 R Axis:   76 Text Interpretation:  Sinus rhythm Within normal limits When compared with ECG of 07/22/2018, No significant change was found Confirmed by Dione BoozeGlick, David ([id number]) on 08/06/2018 12:39:44 AM   Radiology Dg Chest 2 View  Result Date: 08/05/2018 CLINICAL DATA:  Central chest pain radiating to the back starting yesterday. Shortness of breath and nausea. EXAM: CHEST - 2 VIEW COMPARISON:  12/08/2017 FINDINGS: The heart size and mediastinal contours are within normal limits. Both lungs are clear. The visualized skeletal structures are unremarkable. IMPRESSION: No active cardiopulmonary disease. Electronically Signed   By: Burman NievesWilliam  Stevens M.D.   On: 08/05/2018 23:50   Ct Angio Chest Pe W And/or Wo Contrast  Result Date: 08/06/2018 CLINICAL DATA:  Central chest pain radiating to the back since yesterday. Shortness of breath and nausea. EXAM: CT ANGIOGRAPHY CHEST WITH CONTRAST TECHNIQUE: Multidetector CT imaging of the chest was performed using the standard protocol during bolus administration of intravenous contrast. Multiplanar CT image reconstructions and MIPs were obtained to evaluate the vascular anatomy. CONTRAST:  63mL ISOVUE-370 IOPAMIDOL (ISOVUE-370) INJECTION 76% COMPARISON:  None. FINDINGS: Cardiovascular: Examination is technically limited by patient's body habitus. There is moderately good opacification of the central and segmental pulmonary arteries. No focal filling defects are demonstrated. No evidence of significant pulmonary embolus. Cardiac enlargement. No pericardial effusions. Normal caliber thoracic aorta. Mediastinum/Nodes: No significant lymphadenopathy in the chest. Esophagus is decompressed. Lungs/Pleura: Lungs are clear and expanded. No airspace disease or consolidation. Airways are  patent. No pleural effusions. No pneumothorax. Upper Abdomen: Hepatosplenomegaly. Musculoskeletal: No chest wall abnormality. No acute or significant osseous findings. Review of the MIP images confirms the above findings. IMPRESSION: No evidence of significant pulmonary embolus. No evidence of active pulmonary disease. Electronically Signed   By: Burman NievesWilliam  Stevens  M.D.   On: 08/06/2018 04:26    Procedures Procedures (including critical care time)  Medications Ordered in ED Medications  gi cocktail (Maalox,Lidocaine,Donnatal) (30 mLs Oral Refused 08/06/18 0140)  iopamidol (ISOVUE-370) 76 % injection 100 mL (63 mLs Intravenous Contrast Given 08/06/18 0409)       Final Clinical Impressions(s) / ED Diagnoses   Ruled out for MI and PE in the ED. Suspect GI, gerd.  Will treat.  Follow up with your PMD.    Return for fevers >100.4 unrelieved by medication, shortness of breath, intractable vomiting, or diarrhea, Inability to tolerate liquids or food, cough, altered mental status or any concerns. No signs of systemic illness or infection. The patient is nontoxic-appearing on exam and vital signs are within normal limits.   I have reviewed the triage vital signs and the nursing notes. Pertinent labs &imaging results that were available during my care of the patient were reviewed by me and considered in my medical decision making (see chart for details).  After history, exam, and medical workup I feel the patient has been appropriately medically screened and is safe for discharge home. Pertinent diagnoses were discussed with the patient. Patient was given return precautions.      Surena Welge, MD 08/06/18 [phone]

## 2018-08-06 NOTE — ED Notes (Signed)
Pt discharged from ED; instructions provided and scripts given; Pt encouraged to return to ED if symptoms worsen and to f/u with PCP; Pt verbalized understanding of all instructions 

## 2018-09-24 ENCOUNTER — Other Ambulatory Visit: Payer: Self-pay | Admitting: Family Medicine

## 2018-09-24 DIAGNOSIS — I10 Essential (primary) hypertension: Secondary | ICD-10-CM

## 2018-11-18 ENCOUNTER — Ambulatory Visit (INDEPENDENT_AMBULATORY_CARE_PROVIDER_SITE_OTHER): Payer: Medicaid Other | Admitting: *Deleted

## 2018-11-18 ENCOUNTER — Other Ambulatory Visit (HOSPITAL_COMMUNITY)
Admission: RE | Admit: 2018-11-18 | Discharge: 2018-11-18 | Disposition: A | Discharge status: Home / Self Care | Payer: Medicaid Other | Source: Ambulatory Visit | Attending: Family Medicine | Admitting: Family Medicine

## 2018-11-18 VITALS — BP 144/85 | HR 89 | Ht 67.0 in | Wt 316.4 lb

## 2018-11-18 DIAGNOSIS — N898 Other specified noninflammatory disorders of vagina: Secondary | ICD-10-CM | POA: Insufficient documentation

## 2018-11-18 NOTE — Progress Notes (Signed)
Pt presents with vaginal itching, clear discharge since 11/08/18.  Pt denies odor or lesions on vagina.  Pt tried 1 day monistat multiple times but did not help.  Pt to perform self swab.  Allergies and medications verified.

## 2018-11-19 LAB — CERVICOVAGINAL ANCILLARY ONLY
BACTERIAL VAGINITIS: POSITIVE — AB
CANDIDA VAGINITIS: NEGATIVE
Chlamydia: NEGATIVE
Neisseria Gonorrhea: NEGATIVE
TRICH (WINDOWPATH): NEGATIVE

## 2018-11-19 NOTE — Progress Notes (Signed)
Agree with RN documentation and plan of care. Will follow-up self swab result and treat prn.  Cristal Deer. Earlene Plater, DO OB/GYN Fellow

## 2018-11-22 ENCOUNTER — Telehealth: Payer: Self-pay | Admitting: Family Medicine

## 2018-11-22 ENCOUNTER — Encounter: Payer: Self-pay | Admitting: *Deleted

## 2018-11-22 MED ORDER — METRONIDAZOLE 500 MG PO TABS: 500.0000 mg | ORAL_TABLET | Freq: Two times a day (BID) | ORAL | 0 refills | Status: DC

## 2018-11-22 NOTE — Telephone Encounter (Signed)
Pt called to get her test results. Informed pt that I would put in a message to the nurses and they would be reaching out in the next day or 2. Please call this patient back.

## 2018-11-22 NOTE — Addendum Note (Signed)
Addended by: Jill Side on: 11/22/2018 12:02 PM   Modules accepted: Orders

## 2018-11-22 NOTE — Telephone Encounter (Signed)
Attempted to return pt call @ both phone numbers on file. No answer on both numbers. Pt had +BV on wet prep. Rx sent to pharmacy per standing order. MyChart message sent to pt.

## 2019-02-25 ENCOUNTER — Emergency Department (HOSPITAL_COMMUNITY)
Admission: EM | Admit: 2019-02-25 | Discharge: 2019-02-25 | Disposition: A | Discharge status: Home / Self Care | Payer: Medicaid Other | Attending: Emergency Medicine | Admitting: Emergency Medicine

## 2019-02-25 ENCOUNTER — Encounter (HOSPITAL_COMMUNITY): Payer: Self-pay | Admitting: Emergency Medicine

## 2019-02-25 ENCOUNTER — Other Ambulatory Visit: Payer: Self-pay

## 2019-02-25 ENCOUNTER — Emergency Department (HOSPITAL_COMMUNITY): Payer: Medicaid Other

## 2019-02-25 DIAGNOSIS — I10 Essential (primary) hypertension: Secondary | ICD-10-CM | POA: Diagnosis not present

## 2019-02-25 DIAGNOSIS — M7989 Other specified soft tissue disorders: Secondary | ICD-10-CM | POA: Insufficient documentation

## 2019-02-25 DIAGNOSIS — Z79899 Other long term (current) drug therapy: Secondary | ICD-10-CM | POA: Insufficient documentation

## 2019-02-25 DIAGNOSIS — R2232 Localized swelling, mass and lump, left upper limb: Secondary | ICD-10-CM | POA: Insufficient documentation

## 2019-02-25 DIAGNOSIS — M79642 Pain in left hand: Secondary | ICD-10-CM | POA: Diagnosis present

## 2019-02-25 MED ORDER — IBUPROFEN 800 MG PO TABS: 800.0000 mg | ORAL_TABLET | Freq: Three times a day (TID) | ORAL | 0 refills | Status: DC | PRN

## 2019-02-25 MED ORDER — IBUPROFEN 800 MG PO TABS
800.0000 mg | ORAL_TABLET | Freq: Once | ORAL | Status: AC
  Administered 2019-02-25: 800 mg via ORAL
  Filled 2019-02-25: qty 1

## 2019-02-25 NOTE — ED Notes (Signed)
Patient verbalizes understanding of discharge instructions. Opportunity for questioning and answers were provided. Armband removed by staff, pt discharged from ED.  

## 2019-02-25 NOTE — ED Provider Notes (Signed)
[facility] Note   CSN: [id number] Arrival date & time: 02/25/19  1452    History   Chief Complaint Chief Complaint  Patient presents with  . Hand Pain    HPI Diamond Cook is a 33 y.o. female.     The history is provided by the patient. No language interpreter was used.  Hand Pain    Diamond Cook is a 33 y.o. female who presents to the Emergency Department complaining of hand pain. She presents to the ED complaining of two days of atraumatic left hand pain. She is right-hand dominant and knows that she is experienced swelling and pain to the palmar aspects of her left hand at the base of the second metacarpal. No reports of injuries. 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 Her hand does not hurt unless she pushes directly on this one area. She denies any fevers, chills, nausea, vomiting, numbness, weakness. She has a history of hypertension, no additional medical problems. Past Medical History:  Diagnosis Date  . Anemia   . Anxiety    no meds  . Depression    no meds  . History of blood transfusion 2013   In [place]  . Hypertension   . Obesity   . Vertigo     Patient Active Problem List   Diagnosis Date Noted  . Menorrhagia with irregular cycle 12/17/2017  . Morbid obesity (HCC) 12/17/2017  . Iron deficiency anemia 12/17/2017  . Benign essential hypertension 12/17/2017    Past Surgical History:  Procedure Laterality Date  . CESAREAN SECTION  2004,2005,2012   x 3 in [place]  . DILATION AND CURETTAGE OF UTERUS N/A 02/24/2018   Procedure: DILATATION AND CURETTAGE;  Surgeon: Reva BoresPratt, Tanya S, MD;  Location: [facility];  Service: Gynecology;  Laterality: N/A;  . INTRAUTERINE DEVICE (IUD) INSERTION N/A 02/24/2018   Procedure: INTRAUTERINE DEVICE (IUD) INSERTION;  Surgeon: Reva BoresPratt, Tanya S, MD;  Location: [facility];  Service: Gynecology;  Laterality: N/A;  . NOVASURE ABLATION     In [place]  . TUBAL  LIGATION     in [place]     OB History    Gravida  3   Para  3   Term  3   Preterm      AB      Living  3     SAB      TAB      Ectopic      Multiple      Live Births  3            Home Medications    Prior to Admission medications   Medication Sig Start Date End Date Taking? Authorizing Provider  ferrous sulfate 325 (65 FE) MG tablet Take 1 tablet (325 mg total) by mouth daily. 05/05/18 08/06/26 Yes Mortis, Sharyon MedicusGabrielle I, PA-C  hydrochlorothiazide (HYDRODIURIL) 25 MG tablet TAKE 1 TABLET BY MOUTH EVERY DAY Patient taking differently: Take 25 mg by mouth daily.  09/25/18  Yes Reva BoresPratt, Tanya S, MD  ibuprofen (ADVIL,MOTRIN) 800 MG tablet Take 1 tablet (800 mg total) by mouth every 8 (eight) hours as needed. 02/25/19   Tilden Fossaees, Shinika Estelle, MD    Family History Family History  Problem Relation Age of Onset  . Diabetes Maternal Grandmother   . Hypertension Maternal Grandmother   . Diabetes Maternal Aunt   . Hypertension Maternal Aunt   . Diabetes Maternal Aunt   . Hypertension  Maternal Aunt     Social History Social History   Tobacco Use  . Smoking status: Never Smoker  . Smokeless tobacco: Never Used  Substance Use Topics  . Alcohol use: No  . Drug use: No     Allergies   Patient has no known allergies.   Review of Systems Review of Systems  All other systems reviewed and are negative.    Physical Exam Updated Vital Signs BP 139/90   Pulse 88   Temp 98.4 F (36.9 C) (Oral)   Resp 16   Ht 5\' 7"  (1.702 m)   Wt (!) 144.2 kg   LMP  (Exact Date)   SpO2 100%   BMI 49.81 kg/m   Physical Exam Vitals signs and nursing note reviewed.  Constitutional:      Appearance: Normal appearance.  HENT:     Head: Normocephalic.  Neck:     Musculoskeletal: Neck supple.  Cardiovascular:     Rate and Rhythm: Normal rate and regular rhythm.  Pulmonary:     Effort: Pulmonary effort is normal. No respiratory distress.  Musculoskeletal:     Comments: 2+ radial  pulses bilaterally. There is swelling and tenderness to the ulnar aspect of the left hand at the base of the second phalange. The area is swollen in approximately 2 cm in diameter with slight boginess. There is no erythema or surrounding induration. Flexion/ extension is intact throughout all digits with no additional swelling to the digits.  Skin:    General: Skin is warm and dry.     Capillary Refill: Capillary refill takes less than 2 seconds.  Neurological:     Mental Status: She is alert and oriented to person, place, and time.     Comments: Five out of five grip strength and bilateral upper extremities with sensation to light touch intact in bilateral hands.  Psychiatric:        Mood and Affect: Mood normal.        Behavior: Behavior normal.      ED Treatments / Results  Labs (all labs ordered are listed, but only abnormal results are displayed) Labs Reviewed - No data to display  EKG None  Radiology Dg Hand Complete Left  Result Date: 02/25/2019 CLINICAL DATA:  Pain for 2 days EXAM: LEFT HAND - COMPLETE 3+ VIEW COMPARISON:  None. FINDINGS: There is no evidence of fracture or dislocation. 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 Soft tissues are unremarkable. IMPRESSION: Negative. Electronically Signed   By: Elige Ko   On: 02/25/2019 16:55    Procedures Procedures (including critical care time)  Medications Ordered in ED Medications  ibuprofen (ADVIL,MOTRIN) tablet 800 mg (800 mg Oral Given 02/25/19 1549)     Initial Impression / Assessment and Plan / ED Course  I have reviewed the triage vital signs and the nursing notes.  Pertinent labs & imaging results that were available during my care of the patient were reviewed by me and considered in my medical decision making (see chart for details).        Patient here for evaluation of left hand swelling and pain. She is well appearing on examination. Exam is not consistent with acute infectious process or  gout. She possibly has a ruptured cyst with local inflammation. Discussed home care with rest, will prescribe ibuprofen. Discussed outpatient follow-up and return precautions. Final Clinical Impressions(s) / ED Diagnoses   Final diagnoses:  Swelling of left hand    ED Discharge Orders  Ordered    ibuprofen (ADVIL,MOTRIN) 800 MG tablet  Every 8 hours PRN     02/25/19 1725           Tilden Fossa, MD 02/25/19 1754

## 2019-02-25 NOTE — Discharge Instructions (Addendum)
Try not to put pressure on the palm of your left hand. Get rechecked if you have fevers, worsening pain or swelling.

## 2019-02-25 NOTE — ED Triage Notes (Signed)
Pt reports left hand pain that started 2 days ago. Pt denies any injury to same.

## 2019-03-14 ENCOUNTER — Ambulatory Visit (INDEPENDENT_AMBULATORY_CARE_PROVIDER_SITE_OTHER): Payer: Medicaid Other

## 2019-03-14 ENCOUNTER — Encounter: Payer: Self-pay | Admitting: Podiatry

## 2019-03-14 ENCOUNTER — Ambulatory Visit: Payer: Medicaid Other

## 2019-03-14 ENCOUNTER — Ambulatory Visit: Payer: Medicaid Other | Admitting: Podiatry

## 2019-03-14 ENCOUNTER — Other Ambulatory Visit: Payer: Self-pay

## 2019-03-14 VITALS — BP 133/84 | HR 90 | Temp 98.7°F

## 2019-03-14 DIAGNOSIS — M722 Plantar fascial fibromatosis: Secondary | ICD-10-CM | POA: Diagnosis not present

## 2019-03-14 MED ORDER — MELOXICAM 15 MG PO TABS: 15.0000 mg | ORAL_TABLET | Freq: Every day | ORAL | 1 refills | Status: DC

## 2019-03-14 NOTE — Patient Instructions (Signed)
Powersteps insoles available on [employer]

## 2019-03-14 NOTE — Progress Notes (Signed)
   Subjective: 33 y.o. female presents the office today for evaluation of bilateral heel pain that is been going on for approximately 2 weeks now.  Right greater than the left.  Patient denies injury.  She denies any change in activity.  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  She has not tried anything for treatment.  She presents for further treatment evaluation   Past Medical History:  Diagnosis Date  . Anemia   . Anxiety    no meds  . Depression    no meds  . History of blood transfusion 2013   In [place]  . Hypertension   . Obesity   . Vertigo      Objective: Physical Exam General: The patient is alert and oriented x3 in no acute distress.  Dermatology: Skin is warm, dry and supple bilateral lower extremities. Negative for open lesions or macerations bilateral.   Vascular: Dorsalis Pedis and Posterior Tibial pulses palpable bilateral.  Capillary fill time is immediate to all digits.  Neurological: Epicritic and protective threshold intact bilateral.   Musculoskeletal: Tenderness to palpation to the plantar aspect of the bilateral heels along the plantar fascia. All other joints range of motion within normal limits bilateral. Strength 5/5 in all groups bilateral.   Radiographic exam: Normal osseous mineralization. Joint spaces preserved. No fracture/dislocation/boney destruction. No other soft tissue abnormalities or radiopaque foreign bodies.   Assessment: 1. plantar fasciitis bilateral feet R>L  Plan of Care:  1. Patient evaluated. Xrays reviewed.   2. Injection of 0.5cc Celestone soluspan injected into the right heel.  3. Rx for Meloxicam ordered for patient. 5.  Recommend OTC power step insoles available on [employer]  6.  Instructed patient regarding therapies and modalities at home to alleviate symptoms.  7. Return to clinic in 4 weeks.    Felecia Shelling, DPM [facility]  Dr. Felecia Shelling, DPM     [address] [place], [place] [address]                Office [phone]  Fax [phone]

## 2019-04-13 ENCOUNTER — Ambulatory Visit: Payer: Medicaid Other | Admitting: Podiatry

## 2019-05-04 ENCOUNTER — Ambulatory Visit: Payer: Medicaid Other | Admitting: Podiatry

## 2019-05-11 ENCOUNTER — Ambulatory Visit: Payer: Medicaid Other | Admitting: Podiatry

## 2019-05-11 ENCOUNTER — Other Ambulatory Visit: Payer: Self-pay

## 2019-05-11 ENCOUNTER — Encounter: Payer: Self-pay | Admitting: Podiatry

## 2019-05-11 DIAGNOSIS — M722 Plantar fascial fibromatosis: Secondary | ICD-10-CM

## 2019-05-11 MED ORDER — MELOXICAM 15 MG PO TABS: 15.0000 mg | ORAL_TABLET | Freq: Every day | ORAL | 1 refills | Status: DC

## 2019-05-11 MED ORDER — METHYLPREDNISOLONE 4 MG PO TBPK: ORAL_TABLET | ORAL | 0 refills | Status: DC

## 2019-05-11 NOTE — Patient Instructions (Signed)
Powerstep insoles on [employer] 

## 2019-05-16 NOTE — Progress Notes (Signed)
   Subjective: 33 y.o. female presents the office today for evaluation of bilateral heel pain that is been going on for approximately 2 weeks now.  Right greater than the left.  Patient denies injury.  She denies any change in activity.  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  She has not tried anything for treatment.  She presents for further treatment evaluation   Past Medical History:  Diagnosis Date  . Anemia   . Anxiety    no meds  . Depression    no meds  . History of blood transfusion 2013   In [place]  . Hypertension   . Obesity   . Vertigo      Objective: Physical Exam General: The patient is alert and oriented x3 in no acute distress.  Dermatology: Skin is warm, dry and supple bilateral lower extremities. Negative for open lesions or macerations bilateral.   Vascular: Dorsalis Pedis and Posterior Tibial pulses palpable bilateral.  Capillary fill time is immediate to all digits.  Neurological: Epicritic and protective threshold intact bilateral.   Musculoskeletal: Tenderness to palpation to the plantar aspect of the bilateral heels along the plantar fascia. All other joints range of motion within normal limits bilateral. Strength 5/5 in all groups bilateral.   Assessment: 1. plantar fasciitis bilateral feet R>L  Plan of Care:  1. Patient evaluated.  2. Injection of 0.5 mLs Celestone Soluspan injected into the bilateral heels along the plantar fascia.  3. Prescription for Medrol Dose Pak provided to patient. 4. Prescription for Meloxicam provided to patient. She did not take the last prescription.  5.  Recommend OTC power step insoles available on [employer]  6.  Instructed patient regarding therapies and modalities at home to alleviate symptoms.  7. Return to clinic in 4 weeks.    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   Edrick Kins, DPM [facility]  Dr. Edrick Kins, DPM    [address], [place] [address]                Office [phone]  Fax [phone]

## 2019-05-18 ENCOUNTER — Other Ambulatory Visit: Payer: Self-pay

## 2019-05-18 ENCOUNTER — Emergency Department (HOSPITAL_COMMUNITY)
Admission: EM | Admit: 2019-05-18 | Discharge: 2019-05-18 | Disposition: A | Discharge status: Home / Self Care | Payer: Medicaid Other | Attending: Emergency Medicine | Admitting: Emergency Medicine

## 2019-05-18 DIAGNOSIS — Z79899 Other long term (current) drug therapy: Secondary | ICD-10-CM | POA: Insufficient documentation

## 2019-05-18 DIAGNOSIS — I10 Essential (primary) hypertension: Secondary | ICD-10-CM | POA: Diagnosis not present

## 2019-05-18 DIAGNOSIS — J029 Acute pharyngitis, unspecified: Secondary | ICD-10-CM | POA: Diagnosis present

## 2019-05-18 MED ORDER — HYDROCHLOROTHIAZIDE 25 MG PO TABS: 25.0000 mg | ORAL_TABLET | Freq: Every day | ORAL | 1 refills | Status: DC

## 2019-05-18 MED ORDER — AMOXICILLIN 500 MG PO CAPS: 500.0000 mg | ORAL_CAPSULE | Freq: Three times a day (TID) | ORAL | 0 refills | Status: DC

## 2019-05-18 NOTE — ED Provider Notes (Signed)
[place] EMERGENCY DEPARTMENT Provider Note   CSN: [id number] Arrival date & time: 05/18/19  1306     History   Chief Complaint Chief Complaint  Patient presents with  . Sore Throat    HPI Diamond Cook is a 33 y.o. female.     The history is provided by the patient. No language interpreter was used.  Sore Throat This is a new problem. The current episode started 2 days ago. The problem occurs constantly. The problem has been gradually worsening. Nothing aggravates the symptoms. Nothing relieves the symptoms. She has tried nothing for the symptoms. The treatment provided no relief.  Pt complains of swollen tonsils  Pt thinks she has strep  Past Medical History:  Diagnosis Date  . Anemia   . Anxiety    no meds  . Depression    no meds  . History of blood transfusion 2013   In [place]  . Hypertension   . Obesity   . Vertigo     Patient Active Problem List   Diagnosis Date Noted  . Menorrhagia with irregular cycle 12/17/2017  . Morbid obesity ([place]) 12/17/2017  . Iron deficiency anemia 12/17/2017  . Benign essential hypertension 12/17/2017    Past Surgical History:  Procedure Laterality Date  . CESAREAN SECTION  2004,2005,2012   x 3 in [place]  . DILATION AND CURETTAGE OF UTERUS N/A 02/24/2018   Procedure: DILATATION AND CURETTAGE;  Surgeon: Donnamae Jude, MD;  Location: [facility] [place] [facility];  Service: Gynecology;  Laterality: N/A;  . INTRAUTERINE DEVICE (IUD) INSERTION N/A 02/24/2018   Procedure: INTRAUTERINE DEVICE (IUD) INSERTION;  Surgeon: Donnamae Jude, MD;  Location: [facility];  Service: Gynecology;  Laterality: N/A;  . NOVASURE ABLATION     In [place]  . TUBAL LIGATION     in [place]     OB History    Gravida  3   Para  3   Term  3   Preterm      AB      Living  3     SAB      TAB      Ectopic      Multiple      Live Births  3            Home Medications    Prior to Admission medications   Medication Sig Start Date  End Date Taking? Authorizing Provider  amoxicillin (AMOXIL) 500 MG capsule Take 1 capsule (500 mg total) by mouth 3 (three) times daily. 05/18/19   Fransico Meadow, PA-C  ferrous sulfate 325 (65 FE) MG tablet Take 1 tablet (325 mg total) by mouth daily. 05/05/18 08/06/26  Mortis, Alvie Heidelberg I, PA-C  hydrochlorothiazide (HYDRODIURIL) 25 MG tablet Take 1 tablet (25 mg total) by mouth daily. 05/18/19   Fransico Meadow, PA-C  ibuprofen (ADVIL,MOTRIN) 800 MG tablet Take 1 tablet (800 mg total) by mouth every 8 (eight) hours as needed. 02/25/19   Quintella Reichert, MD  meloxicam (MOBIC) 15 MG tablet Take 1 tablet (15 mg total) by mouth daily. 05/11/19   Edrick Kins, DPM  methylPREDNISolone (MEDROL DOSEPAK) 4 MG TBPK tablet 6 day dose pack - take as directed 05/11/19   Edrick Kins, DPM    Family History Family History  Problem Relation Age of Onset  . Diabetes Maternal Grandmother   . Hypertension Maternal Grandmother   . Diabetes Maternal Aunt   . Hypertension Maternal Aunt   . Diabetes  Maternal Aunt   . Hypertension Maternal Aunt     Social History Social History   Tobacco Use  . Smoking status: Never Smoker  . Smokeless tobacco: Never Used  Substance Use Topics  . Alcohol use: No  . Drug use: No     Allergies   Patient has no known allergies.   Review of Systems Review of Systems  HENT: Positive for sore throat.   All other systems reviewed and are negative.    Physical Exam Updated Vital Signs BP 106/77 (BP Location: Left Arm)   Pulse 95   Temp 98.9 F (37.2 C) (Oral)   Resp 16   Ht 5\' 7"  (1.702 m)   Wt (!) 144 kg   SpO2 97%   BMI 49.72 kg/m   Physical Exam Vitals signs and nursing note reviewed.  Constitutional:      Appearance: She is well-developed.  HENT:     Head: Normocephalic.     Mouth/Throat:     Mouth: Mucous membranes are moist.     Tonsils: Tonsillar exudate present. 2+ on the right. 2+ on the left.  Eyes:     Conjunctiva/sclera: Conjunctivae  normal.  Cardiovascular:     Rate and Rhythm: Normal rate.  Abdominal:     Palpations: Abdomen is soft.  Skin:    General: Skin is warm.  Neurological:     Mental Status: She is alert.  Psychiatric:        Mood and Affect: Mood normal.      ED Treatments / Results  Labs (all labs ordered are listed, but only abnormal results are displayed) Labs Reviewed - No data to display  EKG None  Radiology No results found.  Procedures Procedures (including critical care time)  Medications Ordered in ED Medications - No data to display   Initial Impression / Assessment and Plan / ED Course  I have reviewed the triage vital signs and the nursing notes.  Pertinent labs & imaging results that were available during my care of the patient were reviewed by me and considered in my medical decision making (see chart for details).        MDM   Pt given rx of amoxicillin for tonsillitis.  Pt request rx for hctz.  .  Final Clinical Impressions(s) / ED Diagnoses   Final diagnoses:  Benign essential hypertension    ED Discharge Orders         Ordered    amoxicillin (AMOXIL) 500 MG capsule  3 times daily     05/18/19 1400    hydrochlorothiazide (HYDRODIURIL) 25 MG tablet  Daily     05/18/19 1400        An After Visit Summary was printed and given to the patient.    Elson AreasSofia, Leslie K, [place] JerseyPA-C 05/18/19 1446    Margarita Grizzleay, Danielle, MD 05/19/19 [phone]

## 2019-05-18 NOTE — Discharge Instructions (Signed)
Return if any problems.

## 2019-05-18 NOTE — ED Triage Notes (Signed)
Reports sore throat X 2 days

## 2019-06-07 ENCOUNTER — Encounter: Payer: Self-pay | Admitting: Podiatry

## 2019-06-07 ENCOUNTER — Ambulatory Visit (INDEPENDENT_AMBULATORY_CARE_PROVIDER_SITE_OTHER): Payer: Medicaid Other | Admitting: Podiatry

## 2019-06-07 ENCOUNTER — Other Ambulatory Visit: Payer: Self-pay

## 2019-06-07 VITALS — Temp 98.1°F

## 2019-06-07 DIAGNOSIS — M722 Plantar fascial fibromatosis: Secondary | ICD-10-CM

## 2019-06-07 MED ORDER — METHYLPREDNISOLONE 4 MG PO TBPK: ORAL_TABLET | ORAL | 0 refills | Status: DC

## 2019-06-08 ENCOUNTER — Ambulatory Visit: Payer: Medicaid Other | Admitting: Podiatry

## 2019-06-09 NOTE — Progress Notes (Signed)
   Subjective: 33 y.o. female presents the office today for follow up evaluation of bilateral plantar fasciitis. She reports continued sharp pain that occurs throughout the day. Being on her feet increases the pain. She has been taking Meloxicam for treatment. Patient is here for further evaluation and treatment.   Past Medical History:  Diagnosis Date  . Anemia   . Anxiety    no meds  . Depression    no meds  . History of blood transfusion 2013   In [place]  . Hypertension   . Obesity   . Vertigo      Objective: Physical Exam General: The patient is alert and oriented x3 in no acute distress.  Dermatology: Skin is warm, dry and supple bilateral lower extremities. Negative for open lesions or macerations bilateral.   Vascular: Dorsalis Pedis and Posterior Tibial pulses palpable bilateral.  Capillary fill time is immediate to all digits.  Neurological: Epicritic and protective threshold intact bilateral.   Musculoskeletal: Tenderness to palpation to the plantar aspect of the bilateral heels along the plantar fascia. All other joints range of motion within normal limits bilateral. Strength 5/5 in all groups bilateral.   Assessment: 1. plantar fasciitis bilateral feet R>L  Plan of Care:  1. Patient evaluated.  2. Patient did not receive prescription for Medrol from pharmacy. Re-prescribed Medrol Dose Pak, then continue taking Meloxicam.  3. Injection of 0.5 mLs Celestone Soluspan injected into the right heel.  4. [place] coming in today from [place]  5. Return to clinic in 4 weeks.     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   Edrick Kins, DPM [facility]  Dr. Edrick Kins, DPM    [address], [place] [address]                Office [phone]  Fax [phone]

## 2019-06-10 ENCOUNTER — Other Ambulatory Visit: Payer: Self-pay | Admitting: Podiatry

## 2019-06-10 MED ORDER — IBUPROFEN 800 MG PO TABS: 800.0000 mg | ORAL_TABLET | Freq: Three times a day (TID) | ORAL | 0 refills | Status: DC | PRN

## 2019-06-10 NOTE — Progress Notes (Signed)
PRN pain 

## 2019-06-18 ENCOUNTER — Other Ambulatory Visit: Payer: Self-pay

## 2019-06-18 ENCOUNTER — Encounter (HOSPITAL_COMMUNITY): Payer: Self-pay

## 2019-06-18 ENCOUNTER — Emergency Department (HOSPITAL_COMMUNITY)
Admission: EM | Admit: 2019-06-18 | Discharge: 2019-06-18 | Disposition: A | Discharge status: Home / Self Care | Payer: Medicaid Other | Attending: Emergency Medicine | Admitting: Emergency Medicine

## 2019-06-18 DIAGNOSIS — R002 Palpitations: Secondary | ICD-10-CM | POA: Insufficient documentation

## 2019-06-18 DIAGNOSIS — Z79899 Other long term (current) drug therapy: Secondary | ICD-10-CM | POA: Diagnosis not present

## 2019-06-18 DIAGNOSIS — I10 Essential (primary) hypertension: Secondary | ICD-10-CM | POA: Diagnosis not present

## 2019-06-18 LAB — CBC
HCT: 41.1 % (ref 36.0–46.0)
Hemoglobin: 12.9 g/dL (ref 12.0–15.0)
MCH: 25.8 pg — ABNORMAL LOW (ref 26.0–34.0)
MCHC: 31.4 g/dL (ref 30.0–36.0)
MCV: 82.2 fL (ref 80.0–100.0)
Platelets: 243 10*3/uL (ref 150–400)
RBC: 5 MIL/uL (ref 3.87–5.11)
RDW: 15.2 % (ref 11.5–15.5)
WBC: 4.9 10*3/uL (ref 4.0–10.5)
nRBC: 0 % (ref 0.0–0.2)

## 2019-06-18 LAB — BASIC METABOLIC PANEL
Anion gap: 11 (ref 5–15)
BUN: 16 mg/dL (ref 6–20)
CO2: 23 mmol/L (ref 22–32)
Calcium: 9.7 mg/dL (ref 8.9–10.3)
Chloride: 102 mmol/L (ref 98–111)
Creatinine, Ser: 0.75 mg/dL (ref 0.44–1.00)
GFR calc Af Amer: >60 mL/min (ref >60)
GFR calc non Af Amer: >60 mL/min (ref >60)
Glucose, Bld: 101 mg/dL — ABNORMAL HIGH (ref 70–99)
Potassium: 4.2 mmol/L (ref 3.5–5.1)
Sodium: 136 mmol/L (ref 135–145)

## 2019-06-18 LAB — I-STAT BETA HCG BLOOD, ED (MC, WL, AP ONLY): I-stat hCG, quantitative: <5 m[IU]/mL (ref <5)

## 2019-06-18 MED ORDER — SODIUM CHLORIDE 0.9% FLUSH: 3.0000 mL | Freq: Once | INTRAVENOUS | Status: DC

## 2019-06-18 NOTE — ED Triage Notes (Signed)
Patient complains of 2 days of intermittent palpitations- denis pain, has had same in past with no diagnosis. Alert and oriented, NAD

## 2019-06-18 NOTE — Discharge Instructions (Addendum)
It was our pleasure to provide your ER care today - we hope that you feel better.  Your lab work and ecg look good.  It sounds like you may be having PVCs - see information sheet.   Follow up with primary care doctor in the next couple weeks.  Return to ER if worse, new symptoms, fevers, persistent fast heart beat, chest pain, trouble breathing, fainting, other concern.

## 2019-06-18 NOTE — ED Provider Notes (Signed)
[facility] Provider Note   CSN: [id number] Arrival date & time: 06/18/19  0957     History   Chief Complaint Chief Complaint  Patient presents with  . Palpitations    HPI Diamond Cook is a 33 y.o. female.     Patient c/o intermittent palpitations in the past 1-2 weeks. Symptoms occur at rest, acute onset, moderate, episodic. Last a few seconds per episode. Feels as if skipping beats. Denies syncope. No associated chest pain or discomfort. No sob or unusual doe. No heat intolerance, sweats, no hx thyroid dis. No cough or uri symptoms. No fever or chills. No leg pain or swelling. No recent new meds. No heavy caffeine/stimulant use.   The history is provided by the patient.  Palpitations Associated symptoms: no back pain, no chest pain, no cough, no shortness of breath and no vomiting     Past Medical History:  Diagnosis Date  . Anemia   . Anxiety    no meds  . Depression    no meds  . History of blood transfusion 2013   In [place]  . Hypertension   . Obesity   . Vertigo     Patient Active Problem List   Diagnosis Date Noted  . Menorrhagia with irregular cycle 12/17/2017  . Morbid obesity ([place]) 12/17/2017  . Iron deficiency anemia 12/17/2017  . Benign essential hypertension 12/17/2017    Past Surgical History:  Procedure Laterality Date  . CESAREAN SECTION  2004,2005,2012   x 3 in [place]  . DILATION AND CURETTAGE OF UTERUS N/A 02/24/2018   Procedure: DILATATION AND CURETTAGE;  Surgeon: Donnamae Jude, MD;  Location: [facility];  Service: Gynecology;  Laterality: N/A;  . INTRAUTERINE DEVICE (IUD) INSERTION N/A 02/24/2018   Procedure: INTRAUTERINE DEVICE (IUD) INSERTION;  Surgeon: Donnamae Jude, MD;  Location: [facility];  Service: Gynecology;  Laterality: N/A;  . NOVASURE ABLATION     In [place]  . TUBAL LIGATION     in [place]     OB History    Gravida  3   Para  3   Term  3   Preterm      AB      Living  3     SAB      TAB      Ectopic      Multiple      Live Births  3            Home Medications    Prior to Admission medications   Medication Sig Start Date End Date Taking? Authorizing Provider  amoxicillin (AMOXIL) 500 MG capsule Take 1 capsule (500 mg total) by mouth 3 (three) times daily. 05/18/19   Fransico Meadow, PA-C  ferrous sulfate 325 (65 FE) MG tablet Take 1 tablet (325 mg total) by mouth daily. 05/05/18 08/06/26  Mortis, Alvie Heidelberg I, PA-C  hydrochlorothiazide (HYDRODIURIL) 25 MG tablet Take 1 tablet (25 mg total) by mouth daily. 05/18/19   Fransico Meadow, PA-C  ibuprofen (ADVIL) 800 MG tablet Take 1 tablet (800 mg total) by mouth every 8 (eight) hours as needed. 06/10/19   Edrick Kins, DPM  meloxicam (MOBIC) 15 MG tablet Take 1 tablet (15 mg total) by mouth daily. 05/11/19   Edrick Kins, DPM  methylPREDNISolone (MEDROL DOSEPAK) 4 MG TBPK tablet 6 day dose pack - take as directed 06/07/19   Edrick Kins, DPM    Family History Family History  Problem Relation Age of Onset  . Diabetes Maternal Grandmother   . Hypertension Maternal Grandmother   . Diabetes Maternal Aunt   . Hypertension Maternal Aunt   . Diabetes Maternal Aunt   . Hypertension Maternal Aunt     Social History Social History   Tobacco Use  . Smoking status: Never Smoker  . Smokeless tobacco: Never Used  Substance Use Topics  . Alcohol use: No  . Drug use: No     Allergies   Patient has no known allergies.   Review of Systems Review of Systems  Constitutional: Negative for chills and fever.  HENT: Negative for sore throat.   Eyes: Negative for redness.  Respiratory: Negative for cough and shortness of breath.   Cardiovascular: Positive for palpitations. Negative for chest pain and leg swelling.  Gastrointestinal: Negative for abdominal pain, diarrhea and vomiting.  Genitourinary: Negative for flank pain.  Musculoskeletal: Negative for back pain and neck pain.  Skin: Negative for rash.   Neurological: Negative for syncope and headaches.  Hematological: Does not bruise/bleed easily.  Psychiatric/Behavioral: Negative for confusion.     Physical Exam Updated Vital Signs BP 115/83   Pulse 78   Temp 98.3 F (36.8 C) (Oral)   Resp 20   SpO2 99%   Physical Exam Vitals signs and nursing note reviewed.  Constitutional:      Appearance: Normal appearance. She is well-developed.  HENT:     Head: Atraumatic.     Nose: Nose normal.     Mouth/Throat:     Mouth: Mucous membranes are moist.  Eyes:     General: No scleral icterus.    Conjunctiva/sclera: Conjunctivae normal.  Neck:     Musculoskeletal: Normal range of motion and neck supple. No neck rigidity or muscular tenderness.     Trachea: No tracheal deviation.     Comments: Thyroid not enlarged or tender. Cardiovascular:     Rate and Rhythm: Normal rate and regular rhythm.     Pulses: Normal pulses.     Heart sounds: Normal heart sounds. No murmur. No friction rub. No gallop.   Pulmonary:     Effort: Pulmonary effort is normal. No respiratory distress.     Breath sounds: Normal breath sounds.  Abdominal:     General: There is no distension.     Palpations: Abdomen is soft.     Tenderness: There is no abdominal tenderness.  Genitourinary:    Comments: No cva tenderness.  Musculoskeletal:        General: No swelling.     Right lower leg: No edema.     Left lower leg: No edema.  Skin:    General: Skin is warm and dry.     Findings: No rash.  Neurological:     Mental Status: She is alert.     Comments: Alert, speech normal. Steady gait.   Psychiatric:        Mood and Affect: Mood normal.      ED Treatments / Results  Labs (all labs ordered are listed, but only abnormal results are displayed) Results for orders placed or performed during the hospital encounter of 06/18/19  Basic metabolic panel  Result Value Ref Range   Sodium 136 135 - 145 mmol/L   Potassium 4.2 3.5 - 5.1 mmol/L   Chloride 102 98  - 111 mmol/L   CO2 23 22 - 32 mmol/L   Glucose, Bld 101 (H) 70 - 99 mg/dL   BUN 16 6 - 20 mg/dL  Creatinine, Ser 0.75 0.44 - 1.00 mg/dL   Calcium 9.7 8.9 - 16.110.3 mg/dL   GFR calc non Af Amer >60 >60 mL/min   GFR calc Af Amer >60 >60 mL/min   Anion gap 11 5 - 15  CBC  Result Value Ref Range   WBC 4.9 4.0 - 10.5 K/uL   RBC 5.00 3.87 - 5.11 MIL/uL   Hemoglobin 12.9 12.0 - 15.0 g/dL   HCT 09.641.1 04.536.0 - 40.946.0 %   MCV 82.2 80.0 - 100.0 fL   MCH 25.8 (L) 26.0 - 34.0 pg   MCHC 31.4 30.0 - 36.0 g/dL   RDW 81.115.2 91.411.5 - 78.215.5 %   Platelets 243 150 - 400 K/uL   nRBC 0.0 0.0 - 0.2 %  I-Stat beta hCG blood, ED  Result Value Ref Range   I-stat hCG, quantitative <5.0 <5 mIU/mL   Comment 3            EKG EKG Interpretation  Date/Time:  Saturday June 18 2019 10:03:24 EDT Ventricular Rate:  81 PR Interval:  164 QRS Duration: 90 QT Interval:  384 QTC Calculation: 446 R Axis:   70 Text Interpretation:  Normal sinus rhythm Nonspecific T wave abnormality No significant change since last tracing Confirmed by Cathren LaineSteinl, Tarren Sabree ([id number]) on 06/18/2019 10:10:35 AM   Radiology No results found.  Procedures Procedures (including critical care time)  Medications Ordered in ED Medications  sodium chloride flush (NS) 0.9 % injection 3 mL (has no administration in time range)     Initial Impression / Assessment and Plan / ED Course  I have reviewed the triage vital signs and the nursing notes.  Pertinent labs & imaging results that were available during my care of the patient were reviewed by me and considered in my medical decision making (see chart for details).  ECG. Labs.   Cardiac monitoring.   Reviewed nursing notes and prior charts for additional history.   Labs reviewed by me - chem normal.  Recheck - pt asymptomatic, in sinus rhythm.  Pt currently appears stable for d/c.   Return precautions provided.     Final Clinical Impressions(s) / ED Diagnoses   Final diagnoses:  None     ED Discharge Orders    None       Cathren LaineSteinl, Christian Borgerding, MD 06/18/19 1123

## 2019-07-05 ENCOUNTER — Ambulatory Visit: Payer: Medicaid Other | Admitting: Podiatry

## 2019-07-13 ENCOUNTER — Other Ambulatory Visit: Payer: Self-pay

## 2019-07-13 ENCOUNTER — Emergency Department
Admission: EM | Admit: 2019-07-13 | Discharge: 2019-07-14 | Disposition: A | Discharge status: Home / Self Care | Payer: Medicaid Other | Attending: Emergency Medicine | Admitting: Emergency Medicine

## 2019-07-13 DIAGNOSIS — I493 Ventricular premature depolarization: Secondary | ICD-10-CM | POA: Diagnosis not present

## 2019-07-13 DIAGNOSIS — I10 Essential (primary) hypertension: Secondary | ICD-10-CM | POA: Insufficient documentation

## 2019-07-13 DIAGNOSIS — M79602 Pain in left arm: Secondary | ICD-10-CM | POA: Diagnosis not present

## 2019-07-13 DIAGNOSIS — R002 Palpitations: Secondary | ICD-10-CM

## 2019-07-13 DIAGNOSIS — R0600 Dyspnea, unspecified: Secondary | ICD-10-CM | POA: Insufficient documentation

## 2019-07-13 DIAGNOSIS — Z79899 Other long term (current) drug therapy: Secondary | ICD-10-CM | POA: Diagnosis not present

## 2019-07-13 LAB — COMPREHENSIVE METABOLIC PANEL
ALT: 17 U/L (ref 0–44)
AST: 18 U/L (ref 15–41)
Albumin: 3.8 g/dL (ref 3.5–5.0)
Alkaline Phosphatase: 76 U/L (ref 38–126)
Anion gap: 9 (ref 5–15)
BUN: 14 mg/dL (ref 6–20)
CO2: 25 mmol/L (ref 22–32)
Calcium: 9.3 mg/dL (ref 8.9–10.3)
Chloride: 104 mmol/L (ref 98–111)
Creatinine, Ser: 0.74 mg/dL (ref 0.44–1.00)
GFR calc Af Amer: >60 mL/min (ref >60)
GFR calc non Af Amer: >60 mL/min (ref >60)
Glucose, Bld: 103 mg/dL — ABNORMAL HIGH (ref 70–99)
Potassium: 3.9 mmol/L (ref 3.5–5.1)
Sodium: 138 mmol/L (ref 135–145)
Total Bilirubin: 0.6 mg/dL (ref 0.3–1.2)
Total Protein: 8 g/dL (ref 6.5–8.1)

## 2019-07-13 LAB — CBC
HCT: 39.7 % (ref 36.0–46.0)
Hemoglobin: 12.6 g/dL (ref 12.0–15.0)
MCH: 25.7 pg — ABNORMAL LOW (ref 26.0–34.0)
MCHC: 31.7 g/dL (ref 30.0–36.0)
MCV: 81 fL (ref 80.0–100.0)
Platelets: 216 10*3/uL (ref 150–400)
RBC: 4.9 MIL/uL (ref 3.87–5.11)
RDW: 14.3 % (ref 11.5–15.5)
WBC: 5.9 10*3/uL (ref 4.0–10.5)
nRBC: 0 % (ref 0.0–0.2)

## 2019-07-13 LAB — TROPONIN I (HIGH SENSITIVITY): Troponin I (High Sensitivity): 3 ng/L (ref <18)

## 2019-07-13 NOTE — ED Provider Notes (Signed)
[facility] Emergency Department Provider Note   First MD Initiated Contact with Patient 07/13/19 2333     (approximate)  I have reviewed the triage vital signs and the nursing notes.   HISTORY  Chief Complaint Palpitations    HPI Diamond Cook is a 33 y.o. female with below list of previous medical conditions including palpitation which patient states is been occurring for years presents to the emergency department secondary to increasing palpitations.  Patient states it feels like "heart skipping a beat".  Patient does admit to dyspnea at the time when it does occur and then resolution after the episode is resolved which is brief.  Patient denies any dizziness no chest pain with the events.  Patient states that she has had left arm pain for the past 2 days without any injury.  Patient states no arm pain at present.  Patient however denies any chest pain.        Past Medical History:  Diagnosis Date  . Anemia   . Anxiety    no meds  . Depression    no meds  . History of blood transfusion 2013   In [place]  . Hypertension   . Obesity   . Vertigo     Patient Active Problem List   Diagnosis Date Noted  . Menorrhagia with irregular cycle 12/17/2017  . Morbid obesity ([facility]) 12/17/2017  . Iron deficiency anemia 12/17/2017  . Benign essential hypertension 12/17/2017    Past Surgical History:  Procedure Laterality Date  . CESAREAN SECTION  2004,2005,2012   x 3 in [place]  . DILATION AND CURETTAGE OF UTERUS N/A 02/24/2018   Procedure: DILATATION AND CURETTAGE;  Surgeon: Reva Bores, MD;  Location: [facility];  Service: Gynecology;  Laterality: N/A;  . INTRAUTERINE DEVICE (IUD) INSERTION N/A 02/24/2018   Procedure: INTRAUTERINE DEVICE (IUD) INSERTION;  Surgeon: Reva Bores, MD;  Location: [facility];  Service: Gynecology;  Laterality: N/A;  . NOVASURE ABLATION     In [place]  . TUBAL LIGATION     in [place]    Prior to Admission medications    Medication Sig Start Date End Date Taking? Authorizing Provider  amoxicillin (AMOXIL) 500 MG capsule Take 1 capsule (500 mg total) by mouth 3 (three) times daily. 05/18/19   Elson Areas, PA-C  ferrous sulfate 325 (65 FE) MG tablet Take 1 tablet (325 mg total) by mouth daily. 05/05/18 08/06/26  Mortis, Jerrel Ivory I, PA-C  hydrochlorothiazide (HYDRODIURIL) 25 MG tablet Take 1 tablet (25 mg total) by mouth daily. 05/18/19   Elson Areas, PA-C  ibuprofen (ADVIL) 800 MG tablet Take 1 tablet (800 mg total) by mouth every 8 (eight) hours as needed. 06/10/19   Felecia Shelling, DPM  meloxicam (MOBIC) 15 MG tablet Take 1 tablet (15 mg total) by mouth daily. 05/11/19   Felecia Shelling, DPM  methylPREDNISolone (MEDROL DOSEPAK) 4 MG TBPK tablet 6 day dose pack - take as directed 06/07/19   Felecia Shelling, DPM    Allergies Patient has no known allergies.  Family History  Problem Relation Age of Onset  . Diabetes Maternal Grandmother   . Hypertension Maternal Grandmother   . Diabetes Maternal Aunt   . Hypertension Maternal Aunt   . Diabetes Maternal Aunt   . Hypertension Maternal Aunt     Social History Social History   Tobacco Use  . Smoking status: Never Smoker  . Smokeless tobacco: Never Used  Substance Use Topics  .  Alcohol use: No  . Drug use: No    Review of Systems Constitutional: No fever/chills Eyes: No visual changes. ENT: No sore throat. Cardiovascular: Denies chest pain. Respiratory: Denies shortness of breath. Gastrointestinal: No abdominal pain.  No nausea, no vomiting.  No diarrhea.  No constipation. Genitourinary: Negative for dysuria. Musculoskeletal: Negative for neck pain.  Negative for back pain.  Positive for left arm pain Integumentary: Negative for rash. Neurological: Negative for headaches, focal weakness or numbness.   ____________________________________________   PHYSICAL EXAM:  VITAL SIGNS: ED Triage Vitals  Enc Vitals Group     BP 07/13/19 2231 132/89      Pulse Rate 07/13/19 2231 80     Resp 07/13/19 2231 20     Temp 07/13/19 2231 98.1 F (36.7 C)     Temp Source 07/13/19 2231 Oral     SpO2 07/13/19 2231 96 %     Weight 07/13/19 2232 (!) 142.4 kg (314 lb)     Height 07/13/19 2232 1.702 m (5\' 7" )     Head Circumference --      Peak Flow --      Pain Score 07/13/19 2232 8     Pain Loc --      Pain Edu? --      Excl. in [place]? --     Constitutional: Alert and oriented.  Eyes: Conjunctivae are normal.  Head: Atraumatic. Mouth/Throat: Mucous membranes are moist. Neck: No stridor.  No meningeal signs.   Cardiovascular: Normal rate, regular rhythm. Good peripheral circulation. Grossly normal heart sounds. Respiratory: Normal respiratory effort.  No retractions. Gastrointestinal: Soft and nontender. No distention.  Musculoskeletal: No lower extremity tenderness nor edema. No gross deformities of extremities. Neurologic:  Normal speech and language. No gross focal neurologic deficits are appreciated.  Skin:  Skin is warm, dry and intact. Psychiatric: Mood and affect are normal. Speech and behavior are normal.  ____________________________________________   LABS (all labs ordered are listed, but only abnormal results are displayed)  Labs Reviewed  CBC - Abnormal; Notable for the following components:      Result Value   MCH 25.7 (*)    All other components within normal limits  COMPREHENSIVE METABOLIC PANEL - Abnormal; Notable for the following components:   Glucose, Bld 103 (*)    All other components within normal limits  TROPONIN I (HIGH SENSITIVITY)   ____________________________________________  EKG  ED ECG REPORT I, [place] N Meyli Boice, the attending physician, personally viewed and interpreted this ECG.   Date: 07/13/2019  EKG Time: 10:35PM  Rate: 81  Rhythm:Normal Sinus Rhythm  Axis: Normal  Intervals:Normal  ST&T Change: None    Procedures   ____________________________________________   INITIAL  IMPRESSION / MDM / ASSESSMENT AND PLAN / ED COURSE  As part of my medical decision making, I reviewed the following data within the electronic MEDICAL RECORD NUMBER   33 year old female presents with above-stated history and physical exam secondary to palpitation and left arm pain.  During evaluation patient noted to have premature ventricular contractions sporadically on the monitor.  Given absence of chest pain and shortness of breath consider PE to be very unlikely.  Also considered possibly DVT however patient is pain-free at present.  I will still refer the patient to cardiology given the possibility of another arrhythmia.      ____________________________________________  FINAL CLINICAL IMPRESSION(S) / ED DIAGNOSES  Final diagnoses:  PVC (premature ventricular contraction)  Heart palpitations     MEDICATIONS GIVEN DURING THIS  VISIT:  Medications - No data to display   ED Discharge Orders    None      *Please note:  Aryonna Dawe was evaluated in Emergency Department on 07/13/2019 for the symptoms described in the history of present illness. She was evaluated in the context of the global COVID-19 pandemic, which necessitated consideration that the patient might be at risk for infection with the SARS-CoV-2 virus that causes COVID-19. Institutional protocols and algorithms that pertain to the evaluation of patients at risk for COVID-19 are in a state of rapid change based on information released by regulatory bodies including the CDC and federal and state organizations. These policies and algorithms were followed during the patient's care in the ED.  Some ED evaluations and interventions may be delayed as a result of limited staffing during the pandemic.*  Note:  This document was prepared using Dragon voice recognition software and may include unintentional dictation errors.   Darci CurrentBrown, [place] N, MD 07/13/19 [id number]

## 2019-07-13 NOTE — ED Triage Notes (Signed)
Pt in with co palpitations for 2-3 months, has been to her pmd and [facility] for the same. Was dx with palpitations but now concerned due to left arm pain.

## 2019-07-17 ENCOUNTER — Other Ambulatory Visit: Payer: Self-pay | Admitting: Podiatry

## 2019-10-23 ENCOUNTER — Emergency Department: Payer: Medicaid Other

## 2019-10-23 ENCOUNTER — Other Ambulatory Visit: Payer: Self-pay

## 2019-10-23 ENCOUNTER — Emergency Department
Admission: EM | Admit: 2019-10-23 | Discharge: 2019-10-23 | Disposition: A | Discharge status: Home / Self Care | Payer: Medicaid Other | Attending: Emergency Medicine | Admitting: Emergency Medicine

## 2019-10-23 ENCOUNTER — Encounter: Payer: Self-pay | Admitting: Radiology

## 2019-10-23 DIAGNOSIS — Z79899 Other long term (current) drug therapy: Secondary | ICD-10-CM | POA: Insufficient documentation

## 2019-10-23 DIAGNOSIS — R509 Fever, unspecified: Secondary | ICD-10-CM

## 2019-10-23 DIAGNOSIS — J039 Acute tonsillitis, unspecified: Secondary | ICD-10-CM | POA: Diagnosis not present

## 2019-10-23 DIAGNOSIS — Z20828 Contact with and (suspected) exposure to other viral communicable diseases: Secondary | ICD-10-CM | POA: Insufficient documentation

## 2019-10-23 DIAGNOSIS — I10 Essential (primary) hypertension: Secondary | ICD-10-CM | POA: Insufficient documentation

## 2019-10-23 DIAGNOSIS — R070 Pain in throat: Secondary | ICD-10-CM | POA: Diagnosis present

## 2019-10-23 LAB — CBC WITH DIFFERENTIAL/PLATELET
Abs Immature Granulocytes: 0.03 10*3/uL (ref 0.00–0.07)
Basophils Absolute: 0 10*3/uL (ref 0.0–0.1)
Basophils Relative: 0 %
Eosinophils Absolute: 0.1 10*3/uL (ref 0.0–0.5)
Eosinophils Relative: 1 %
HCT: 35.6 % — ABNORMAL LOW (ref 36.0–46.0)
Hemoglobin: 11.4 g/dL — ABNORMAL LOW (ref 12.0–15.0)
Immature Granulocytes: 0 %
Lymphocytes Relative: 11 %
Lymphs Abs: 0.9 10*3/uL (ref 0.7–4.0)
MCH: 25.7 pg — ABNORMAL LOW (ref 26.0–34.0)
MCHC: 32 g/dL (ref 30.0–36.0)
MCV: 80.2 fL (ref 80.0–100.0)
Monocytes Absolute: 0.5 10*3/uL (ref 0.1–1.0)
Monocytes Relative: 6 %
Neutro Abs: 6.8 10*3/uL (ref 1.7–7.7)
Neutrophils Relative %: 82 %
Platelets: 161 10*3/uL (ref 150–400)
RBC: 4.44 MIL/uL (ref 3.87–5.11)
RDW: 14.5 % (ref 11.5–15.5)
WBC: 8.3 10*3/uL (ref 4.0–10.5)
nRBC: 0 % (ref 0.0–0.2)

## 2019-10-23 LAB — LACTIC ACID, PLASMA: Lactic Acid, Venous: 1.3 mmol/L (ref 0.5–1.9)

## 2019-10-23 LAB — COMPREHENSIVE METABOLIC PANEL
ALT: 14 U/L (ref 0–44)
AST: 18 U/L (ref 15–41)
Albumin: 3.6 g/dL (ref 3.5–5.0)
Alkaline Phosphatase: 67 U/L (ref 38–126)
Anion gap: 8 (ref 5–15)
BUN: 15 mg/dL (ref 6–20)
CO2: 24 mmol/L (ref 22–32)
Calcium: 8.9 mg/dL (ref 8.9–10.3)
Chloride: 102 mmol/L (ref 98–111)
Creatinine, Ser: 0.8 mg/dL (ref 0.44–1.00)
GFR calc Af Amer: >60 mL/min (ref >60)
GFR calc non Af Amer: >60 mL/min (ref >60)
Glucose, Bld: 115 mg/dL — ABNORMAL HIGH (ref 70–99)
Potassium: 3.8 mmol/L (ref 3.5–5.1)
Sodium: 134 mmol/L — ABNORMAL LOW (ref 135–145)
Total Bilirubin: 0.4 mg/dL (ref 0.3–1.2)
Total Protein: 7.8 g/dL (ref 6.5–8.1)

## 2019-10-23 LAB — POC SARS CORONAVIRUS 2 AG: SARS Coronavirus 2 Ag: NEGATIVE

## 2019-10-23 LAB — PROCALCITONIN: Procalcitonin: <0.1 ng/mL

## 2019-10-23 LAB — GROUP A STREP BY PCR: Group A Strep by PCR: NOT DETECTED

## 2019-10-23 LAB — SARS CORONAVIRUS 2 (TAT 6-24 HRS): SARS Coronavirus 2: NEGATIVE

## 2019-10-23 MED ORDER — PREDNISONE 20 MG PO TABS: 60.0000 mg | ORAL_TABLET | Freq: Every day | ORAL | 0 refills | Status: DC

## 2019-10-23 MED ORDER — SODIUM CHLORIDE 0.9 % IV BOLUS
1000.0000 mL | Freq: Once | INTRAVENOUS | Status: AC
  Administered 2019-10-23: 1000 mL via INTRAVENOUS

## 2019-10-23 MED ORDER — AMOXICILLIN-POT CLAVULANATE 875-125 MG PO TABS: 1.0000 | ORAL_TABLET | Freq: Two times a day (BID) | ORAL | 0 refills | Status: AC

## 2019-10-23 MED ORDER — IBUPROFEN 600 MG PO TABS
600.0000 mg | ORAL_TABLET | Freq: Once | ORAL | Status: AC
  Administered 2019-10-23: 600 mg via ORAL
  Filled 2019-10-23: qty 1

## 2019-10-23 MED ORDER — ACETAMINOPHEN 500 MG PO TABS
1000.0000 mg | ORAL_TABLET | Freq: Once | ORAL | Status: AC
  Administered 2019-10-23: 1000 mg via ORAL
  Filled 2019-10-23: qty 2

## 2019-10-23 MED ORDER — IOHEXOL 300 MG/ML  SOLN
75.0000 mL | Freq: Once | INTRAMUSCULAR | Status: AC | PRN
  Administered 2019-10-23: 06:00:00 75 mL via INTRAVENOUS

## 2019-10-23 MED ORDER — DEXAMETHASONE SODIUM PHOSPHATE 10 MG/ML IJ SOLN
10.0000 mg | Freq: Once | INTRAMUSCULAR | Status: AC
  Administered 2019-10-23: 05:00:00 10 mg via INTRAVENOUS
  Filled 2019-10-23: qty 1

## 2019-10-23 MED ORDER — AMOXICILLIN-POT CLAVULANATE 875-125 MG PO TABS
1.0000 | ORAL_TABLET | Freq: Once | ORAL | Status: AC
  Administered 2019-10-23: 08:00:00 1 via ORAL
  Filled 2019-10-23: qty 1

## 2019-10-23 NOTE — ED Provider Notes (Signed)
[facility] Emergency Department Provider Note  ____________________________________________   First MD Initiated Contact with Patient 10/23/19 [phone]     (approximate)  I have reviewed the triage vital signs and the nursing notes.   HISTORY  Chief Complaint No chief complaint on file.    HPI Diamond Cook is a 33 y.o. female with a history of palpitations without a specific diagnosis who presents for evaluation of acute onset sore throat about 24 hours ago which has rapidly progressed to fever and chills, severe sore throat, and some difficulty swallowing.  She has also had nausea but no vomiting.  No known COVID-19 contacts.  No loss of smell or taste.  No significant history of strep throat or oral pharyngeal issues.  No known dental issues.  She is able to swallow her saliva but has pain when she does so.  She is not having any difficulty breathing.  She said that tonight when she was having the symptoms of her sore throat she noticed that her heart was beating very fast and she felt lightheaded when she got up to try to walk around.  This is similar to prior episodes she has had with palpitations but when she arrived to triage she had a heart rate of 130 and a fever of 102.2.  Nothing in particular makes her symptoms better or worse.         Past Medical History:  Diagnosis Date  . Anemia   . Anxiety    no meds  . Depression    no meds  . History of blood transfusion 2013   In [place]  . Hypertension   . Obesity   . Vertigo     Patient Active Problem List   Diagnosis Date Noted  . Menorrhagia with irregular cycle 12/17/2017  . Morbid obesity ([place]) 12/17/2017  . Iron deficiency anemia 12/17/2017  . Benign essential hypertension 12/17/2017    Past Surgical History:  Procedure Laterality Date  . CESAREAN SECTION  2004,2005,2012   x 3 in [place]  . DILATION AND CURETTAGE OF UTERUS N/A 02/24/2018   Procedure: DILATATION AND CURETTAGE;  Surgeon:  Donnamae Jude, MD;  Location: [facility];  Service: Gynecology;  Laterality: N/A;  . INTRAUTERINE DEVICE (IUD) INSERTION N/A 02/24/2018   Procedure: INTRAUTERINE DEVICE (IUD) INSERTION;  Surgeon: Donnamae Jude, MD;  Location: [facility];  Service: Gynecology;  Laterality: N/A;  . NOVASURE ABLATION     In [place]  . TUBAL LIGATION     in [place]    Prior to Admission medications   Medication Sig Start Date End Date Taking? Authorizing Provider  metoprolol succinate (TOPROL-XL) 25 MG 24 hr tablet Take 25 mg by mouth daily.   Yes [provider]  amoxicillin (AMOXIL) 500 MG capsule Take 1 capsule (500 mg total) by mouth 3 (three) times daily. Patient not taking: Reported on 10/23/2019 05/18/19   Fransico Meadow, PA-C  amoxicillin-clavulanate (AUGMENTIN) 875-125 MG tablet Take 1 tablet by mouth every 12 (twelve) hours for 10 days. 10/23/19 11/02/19  Hinda Kehr, MD  ferrous sulfate 325 (65 FE) MG tablet Take 1 tablet (325 mg total) by mouth daily. Patient not taking: Reported on 10/23/2019 05/05/18 08/06/26  Mortis, Alvie Heidelberg I, PA-C  hydrochlorothiazide (HYDRODIURIL) 25 MG tablet Take 1 tablet (25 mg total) by mouth daily. Patient not taking: Reported on 10/23/2019 05/18/19   Fransico Meadow, PA-C  ibuprofen (ADVIL) 800 MG tablet TAKE 1 TABLET BY MOUTH EVERY 8  HOURS AS NEEDED Patient not taking: Reported on 10/23/2019 07/18/19   Felecia ShellingEvans, Brent M, DPM  meloxicam (MOBIC) 15 MG tablet Take 1 tablet (15 mg total) by mouth daily. Patient not taking: Reported on 10/23/2019 05/11/19   Felecia ShellingEvans, Brent M, DPM  methylPREDNISolone (MEDROL DOSEPAK) 4 MG TBPK tablet 6 day dose pack - take as directed Patient not taking: Reported on 10/23/2019 06/07/19   Felecia ShellingEvans, Brent M, DPM  predniSONE (DELTASONE) 20 MG tablet Take 3 tablets (60 mg total) by mouth daily. 10/23/19   Loleta RoseForbach, Melitta Tigue, MD    Allergies Patient has no known allergies.  Family History  Problem Relation Age of Onset  . Diabetes Maternal Grandmother   .  Hypertension Maternal Grandmother   . Diabetes Maternal Aunt   . Hypertension Maternal Aunt   . Diabetes Maternal Aunt   . Hypertension Maternal Aunt     Social History Social History   Tobacco Use  . Smoking status: Never Smoker  . Smokeless tobacco: Never Used  Substance Use Topics  . Alcohol use: No  . Drug use: No    Review of Systems Constitutional: +fever/chills Eyes: No visual changes. ENT: Sore throat and difficulty swallowing, rapidly worsening over 24 hours Cardiovascular: Rapid heart rate/palpitations, denies chest pain. Respiratory: Denies shortness of breath. Gastrointestinal: No abdominal pain.  No nausea, no vomiting.  No diarrhea.  No constipation. Genitourinary: Negative for dysuria. Musculoskeletal: Negative for neck pain.  Negative for back pain. Integumentary: Negative for rash. Neurological: Lightheaded.  Negative for headaches, focal weakness or numbness.   ____________________________________________   PHYSICAL EXAM:  VITAL SIGNS: ED Triage Vitals  Enc Vitals Group     BP 10/23/19 0439 138/61     Pulse Rate 10/23/19 0439 (!) 131     Resp 10/23/19 0439 20     Temp 10/23/19 0439 (!) 102.2 F (39 C)     Temp Source 10/23/19 0439 Oral     SpO2 10/23/19 0439 97 %     Weight 10/23/19 0440 (!) 147.4 kg (325 lb)     Height 10/23/19 0440 1.702 m (5\' 7" )     Head Circumference --      Peak Flow --      Pain Score 10/23/19 0440 6     Pain Loc --      Pain Edu? --      Excl. in GC? --     Constitutional: Alert and oriented.  No acute distress, not tripoding or drooling. Eyes: Conjunctivae are normal.  Head: Atraumatic. Nose: No congestion/rhinnorhea. Mouth/Throat: Very erythematous posterior oropharynx with enlarged tonsils bilaterally with exudate.  No obvious uvular deviation, no obvious peritonsillar abscess.  Soft under tongue, no evidence of Ludwig's angina.  No obvious dental abnormalities.  The patient is able to speak clearly but she has  a "hot potato" voice. Neck: No stridor.  No meningeal signs.  No tenderness to manipulation of the larynx.  No brawny induration submandibularly. Cardiovascular: Tachycardia to the 130s., regular rhythm. Good peripheral circulation. Grossly normal heart sounds. Respiratory: Normal respiratory effort.  No retractions. Gastrointestinal: Soft and nontender. No distention.  Musculoskeletal: No lower extremity tenderness nor edema. No gross deformities of extremities. Neurologic:  Normal speech and language. No gross focal neurologic deficits are appreciated.  Skin:  Skin is warm, dry and intact. Psychiatric: Mood and affect are normal. Speech and behavior are normal.  ____________________________________________   LABS (all labs ordered are listed, but only abnormal results are displayed)  Labs Reviewed  COMPREHENSIVE METABOLIC  PANEL - Abnormal; Notable for the following components:      Result Value   Sodium 134 (*)    Glucose, Bld 115 (*)    All other components within normal limits  CBC WITH DIFFERENTIAL/PLATELET - Abnormal; Notable for the following components:   Hemoglobin 11.4 (*)    HCT 35.6 (*)    MCH 25.7 (*)    All other components within normal limits  GROUP A STREP BY PCR  SARS CORONAVIRUS 2 (TAT 6-24 HRS)  LACTIC ACID, PLASMA  LACTIC ACID, PLASMA  PROCALCITONIN  POC SARS CORONAVIRUS 2 AG -  ED  POC SARS CORONAVIRUS 2 AG  POC SARS CORONAVIRUS 2 AG   ____________________________________________  EKG  ED ECG REPORT I, Loleta Rose, the attending physician, personally viewed and interpreted this ECG.  Date: 10/23/2019 EKG Time: 6:04 AM Rate: 110 Rhythm: Sinus tachycardia QRS Axis: normal Intervals: normal ST/T Wave abnormalities: normal Narrative Interpretation: no evidence of acute ischemia  ____________________________________________  RADIOLOGY I, Loleta Rose, personally viewed and evaluated these images (plain radiographs) as part of my medical decision  making, as well as reviewing the written report by the radiologist.  ED MD interpretation:  Tonsillitis without evidence of fluid collection or emergent medical condition  Official radiology report(s): Ct Soft Tissue Neck W Contrast  Result Date: 10/23/2019 CLINICAL DATA:  Awoke with sore throat which is progressively worsening. EXAM: CT NECK WITH CONTRAST TECHNIQUE: Multidetector CT imaging of the neck was performed using the standard protocol following the bolus administration of intravenous contrast. CONTRAST:  95mL OMNIPAQUE IOHEXOL 300 MG/ML  SOLN COMPARISON:  None. FINDINGS: Pharynx and larynx: Considerable enlargement of the adenoids and tonsils consistent with reactive enlargement. No sign of tonsillar or peritonsillar abscess. No swelling of the epiglottis. No other mucosal or submucosal finding. Salivary glands: Parotid and submandibular glands are normal. Thyroid: Normal Lymph nodes: Normal Vascular: Normal Limited intracranial: Normal Visualized orbits: Normal Mastoids and visualized paranasal sinuses: Clear Skeleton: Ordinary cervical spondylosis. Upper chest: Normal Other: None IMPRESSION: Considerable enlargement of the adenoids and tonsils consistent with reactive enlargement and inflammation. No evidence of low-density collection to suggest drainable tonsillar or peritonsillar abscess Electronically Signed   By: Paulina Fusi M.D.   On: 10/23/2019 06:31    ____________________________________________   PROCEDURES   Procedure(s) performed (including Critical Care):  Procedures   ____________________________________________   INITIAL IMPRESSION / MDM / ASSESSMENT AND PLAN / ED COURSE  As part of my medical decision making, I reviewed the following data within the electronic MEDICAL RECORD NUMBER Nursing notes reviewed and incorporated, Labs reviewed , Old chart reviewed and Notes from prior ED visits   Differential diagnosis includes, but is not limited to, viral  pharyngitis/tonsillitis, bacterial pharyngitis/tonsillitis, COVID-19, epiglottitis, peritonsillar abscess, retropharyngeal infection.  The patient's airway is stable but her tonsils are quite enlarged with exudate and her voice is concerning, as is the rapid worsening over the last 24 hours.  She is febrile and tachycardic to the 130s.  I suspect this is due to her history of intermittent palpitations but also could represent her acute infection and the presence of her fever.  I have ordered Tylenol 1000 mg by mouth although she may have difficulty swallowing it.  I have also ordered a liter of normal saline.  I am ordering labs consistent with sepsis but I think that she does not require empiric antibiotics yet and so we have a better idea of what we are dealing.  I plan for a CT  neck with IV contrast to rule out fluid collection, airway compromise, and epiglottitis.  Strep swab is pending.  I will check a rapid antigen COVID-19 test as an initial test but I anticipate this will be negative.      Clinical Course as of Oct 22 745  Sun Oct 23, 2019  0558 WBC: 8.3 [CF]  [phone] Negative antigen test  POC SARS Coronavirus 2 Ag [CF]  9604 Lactic Acid, Venous: 1.3 [CF]  0740 Patient's lab work is reassuring with no leukocytosis and a normal comprehensive metabolic panel.  Lactic acid is normal.  Group A strep is negative but I am going to treat empirically for bacterial tonsillitis based on the appearance of her tonsils, the heavy exudate, the swelling, etc.  I am also going to give her a couple of days of steroids which I think will help with the pain and the inflammation in addition to the Decadron she got tonight.  I will treat her with Augmentin and I explained to her that we will send a PCR Covid swab.  I explained to her that she needs to isolate at home away from other people and give her return precautions for Pharyngitis/tonsillitis.  She understands and agrees with the plan.    [CF]    Clinical  Course User Index [CF] Loleta Rose, MD     ____________________________________________  FINAL CLINICAL IMPRESSION(S) / ED DIAGNOSES  Final diagnoses:  Acute tonsillitis, unspecified etiology  Fever, unspecified fever cause     MEDICATIONS GIVEN DURING THIS VISIT:  Medications  ibuprofen (ADVIL) tablet 600 mg (has no administration in time range)  amoxicillin-clavulanate (AUGMENTIN) 875-125 MG per tablet 1 tablet (has no administration in time range)  sodium chloride 0.9 % bolus 1,000 mL (0 mLs Intravenous Stopped 10/23/19 0645)  dexamethasone (DECADRON) injection 10 mg (10 mg Intravenous Given 10/23/19 0516)  acetaminophen (TYLENOL) tablet 1,000 mg (1,000 mg Oral Given 10/23/19 0556)  iohexol (OMNIPAQUE) 300 MG/ML solution 75 mL (75 mLs Intravenous Contrast Given 10/23/19 0620)     ED Discharge Orders         Ordered    amoxicillin-clavulanate (AUGMENTIN) 875-125 MG tablet  Every 12 hours     10/23/19 0744    predniSONE (DELTASONE) 20 MG tablet  Daily     10/23/19 0744          *Please note:  Diamond Cook was evaluated in Emergency Department on 10/23/2019 for the symptoms described in the history of present illness. She was evaluated in the context of the global COVID-19 pandemic, which necessitated consideration that the patient might be at risk for infection with the SARS-CoV-2 virus that causes COVID-19. Institutional protocols and algorithms that pertain to the evaluation of patients at risk for COVID-19 are in a state of rapid change based on information released by regulatory bodies including the CDC and federal and state organizations. These policies and algorithms were followed during the patient's care in the ED.  Some ED evaluations and interventions may be delayed as a result of limited staffing during the pandemic.*  Note:  This document was prepared using Dragon voice recognition software and may include unintentional dictation errors.   Loleta Rose, MD 10/23/19  [phone]

## 2019-10-23 NOTE — ED Notes (Signed)
Patient transported to CT 

## 2019-10-23 NOTE — ED Triage Notes (Addendum)
Patient reports woke with sore throat on Saturday a day progressed had chills, and tonight with lightheadedness. Tonsils with swelling and exudate noted.

## 2019-10-23 NOTE — Discharge Instructions (Signed)
As we discussed, your symptoms may all be the result of viral illness (including COVID-19), but given the appearance of your tonsils, I am prescribing free of course of antibiotics.  It is important that you take the full 10-day course as well as the steroids I prescribed to help with the pain and swelling.  Be sure to stay hydrated and drink plenty of fluids including Gatorade or chicken broth even if you do not feel like eating or drinking very much.  Please stay away from other people and isolate at your home as much as possible.  We sent a COVID-19 swab and the results will be available in 24 to 48 hours on my chart.  Please review the information provided in the discharge paperwork for how to access this information.    Return to the emergency department if you develop new or worsening symptoms that concern you.

## 2019-10-25 LAB — POC SARS CORONAVIRUS 2 AG: SARS Coronavirus 2 Ag: NEGATIVE

## 2019-11-12 ENCOUNTER — Other Ambulatory Visit: Payer: Self-pay

## 2019-11-12 ENCOUNTER — Emergency Department
Admission: EM | Admit: 2019-11-12 | Discharge: 2019-11-12 | Disposition: A | Discharge status: Home / Self Care | Payer: Medicaid Other | Attending: Student | Admitting: Student

## 2019-11-12 ENCOUNTER — Encounter: Payer: Self-pay | Admitting: Emergency Medicine

## 2019-11-12 DIAGNOSIS — I10 Essential (primary) hypertension: Secondary | ICD-10-CM | POA: Diagnosis not present

## 2019-11-12 DIAGNOSIS — R519 Headache, unspecified: Secondary | ICD-10-CM | POA: Diagnosis not present

## 2019-11-12 DIAGNOSIS — Z79899 Other long term (current) drug therapy: Secondary | ICD-10-CM | POA: Insufficient documentation

## 2019-11-12 DIAGNOSIS — Z5320 Procedure and treatment not carried out because of patient's decision for unspecified reasons: Secondary | ICD-10-CM | POA: Insufficient documentation

## 2019-11-12 LAB — POCT PREGNANCY, URINE: Preg Test, Ur: NEGATIVE

## 2019-11-12 MED ORDER — DIPHENHYDRAMINE HCL 50 MG/ML IJ SOLN
12.5000 mg | Freq: Once | INTRAMUSCULAR | Status: AC
  Administered 2019-11-12: 12.5 mg via INTRAVENOUS
  Filled 2019-11-12: qty 1

## 2019-11-12 MED ORDER — PROCHLORPERAZINE EDISYLATE 10 MG/2ML IJ SOLN
10.0000 mg | Freq: Once | INTRAMUSCULAR | Status: AC
  Administered 2019-11-12: 10 mg via INTRAVENOUS
  Filled 2019-11-12: qty 2

## 2019-11-12 MED ORDER — KETOROLAC TROMETHAMINE 30 MG/ML IJ SOLN
15.0000 mg | Freq: Once | INTRAMUSCULAR | Status: AC
  Administered 2019-11-12: 15 mg via INTRAVENOUS
  Filled 2019-11-12: qty 1

## 2019-11-12 NOTE — ED Triage Notes (Signed)
Pt c/o migraine HA x 3 days, pt taking ibuprofen and tylenol, pt has hx of migraines.  Pt reports one episode of vomiting and sensitivity to sounds.  No distress noted, pt playing on phone in triage.

## 2019-11-12 NOTE — ED Notes (Addendum)
Pt with c/o migraine x 3 days. Pt states she gets migraines infrequently. Denies blurred vision, pt states she does have nausea. Pt has hx of high bp.

## 2019-11-12 NOTE — ED Notes (Signed)
Upon this RN's arrival pt is standing in doorway. Pt states "I feel better. I'm ready to go. Can you take this thing out my arm?"

## 2019-11-12 NOTE — ED Provider Notes (Signed)
[facility] Emergency Department Provider Note ____________________________________________  Time seen: Approximately 7:47 PM  I have reviewed the triage vital signs and the nursing notes.   HISTORY  Chief Complaint Migraine   HPI Diamond Cook is a 33 y.o. female presents to the emergency department for treatment and evaluation of headache.  Migraine started 3 days ago.  She has a history of migraines.  She states that she can usually just take some Tylenol or ibuprofen it goes away.  Last dose of Tylenol was yesterday evening.  She denies blurred vision but she does endorse nausea.  There is no vision changes.  She denies injury.   Location: Frontal Similar to previous headaches: Yes Duration: Constant TIMING: 3 days ago SEVERITY: 5 or 6 QUALITY: Throb CONTEXT: No known exposure, no injury MODIFYING FACTORS: None ASSOCIATED SYMPTOMS: Nausea Past Medical History:  Diagnosis Date  . Anemia   . Anxiety    no meds  . Depression    no meds  . History of blood transfusion 2013   In [place]  . Hypertension   . Obesity   . Vertigo     Patient Active Problem List   Diagnosis Date Noted  . Menorrhagia with irregular cycle 12/17/2017  . Morbid obesity ([facility]) 12/17/2017  . Iron deficiency anemia 12/17/2017  . Benign essential hypertension 12/17/2017    Past Surgical History:  Procedure Laterality Date  . CESAREAN SECTION  2004,2005,2012   x 3 in [place]  . DILATION AND CURETTAGE OF UTERUS N/A 02/24/2018   Procedure: DILATATION AND CURETTAGE;  Surgeon: Reva Bores, MD;  Location: [facility];  Service: Gynecology;  Laterality: N/A;  . INTRAUTERINE DEVICE (IUD) INSERTION N/A 02/24/2018   Procedure: INTRAUTERINE DEVICE (IUD) INSERTION;  Surgeon: Reva Bores, MD;  Location: [facility];  Service: Gynecology;  Laterality: N/A;  . NOVASURE ABLATION     In [place]  . TUBAL LIGATION     in     Prior to Admission medications   Medication Sig Start  Date End Date Taking? Authorizing Provider  amoxicillin (AMOXIL) 500 MG capsule Take 1 capsule (500 mg total) by mouth 3 (three) times daily. Patient not taking: Reported on 10/23/2019 05/18/19   Elson Areas, PA-C  ferrous sulfate 325 (65 FE) MG tablet Take 1 tablet (325 mg total) by mouth daily. Patient not taking: Reported on 10/23/2019 05/05/18 08/06/26  Mortis, Jerrel Ivory I, PA-C  hydrochlorothiazide (HYDRODIURIL) 25 MG tablet Take 1 tablet (25 mg total) by mouth daily. Patient not taking: Reported on 10/23/2019 05/18/19   Elson Areas, PA-C  ibuprofen (ADVIL) 800 MG tablet TAKE 1 TABLET BY MOUTH EVERY 8 HOURS AS NEEDED Patient not taking: Reported on 10/23/2019 07/18/19   Felecia Shelling, DPM  meloxicam (MOBIC) 15 MG tablet Take 1 tablet (15 mg total) by mouth daily. Patient not taking: Reported on 10/23/2019 05/11/19   Felecia Shelling, DPM  methylPREDNISolone (MEDROL DOSEPAK) 4 MG TBPK tablet 6 day dose pack - take as directed Patient not taking: Reported on 10/23/2019 06/07/19   Felecia Shelling, DPM  metoprolol succinate (TOPROL-XL) 25 MG 24 hr tablet Take 25 mg by mouth daily.    [provider]  predniSONE (DELTASONE) 20 MG tablet Take 3 tablets (60 mg total) by mouth daily. 10/23/19   Loleta Rose, MD    Allergies Patient has no known allergies.  Family History  Problem Relation Age of Onset  . Diabetes Maternal Grandmother   . Hypertension  Maternal Grandmother   . Diabetes Maternal Aunt   . Hypertension Maternal Aunt   . Diabetes Maternal Aunt   . Hypertension Maternal Aunt     Social History Social History   Tobacco Use  . Smoking status: Never Smoker  . Smokeless tobacco: Never Used  Substance Use Topics  . Alcohol use: No  . Drug use: No    Review of Systems Constitutional: No fever/chills or recent injury. Eyes: No visual changes. ENT: No sore throat. Respiratory: Denies shortness of breath. Gastrointestinal: No abdominal pain.  No nausea, no vomiting.  No  diarrhea.  No constipation. Musculoskeletal: Negative for pain. Skin: Negative for rash. Neurological:Positive for headache, negative for focal weakness or numbness. No confusion or fainting. ___________________________________________   PHYSICAL EXAM:  VITAL SIGNS: ED Triage Vitals  Enc Vitals Group     BP 11/12/19 1630 126/82     Pulse Rate 11/12/19 1630 79     Resp 11/12/19 1630 18     Temp 11/12/19 1630 99.5 F (37.5 C)     Temp Source 11/12/19 1630 Oral     SpO2 11/12/19 1630 98 %     Weight --      Height --      Head Circumference --      Peak Flow --      Pain Score 11/12/19 1636 8     Pain Loc --      Pain Edu? --      Excl. in GC? --     Constitutional: Alert and oriented. Well appearing and in no acute distress. Eyes: Conjunctivae are normal. PERRL. EOMI without expressed pain. No evidence of papilledema on limited exam. Head: Atraumatic. Nose: No congestion/rhinnorhea. Mouth/Throat: Mucous membranes are moist.  Oropharynx non-erythematous. Neck: No stridor. Supple, no meningismus.  Cardiovascular: Normal rate, regular rhythm. Grossly normal heart sounds.  Good peripheral circulation. Respiratory: Normal respiratory effort.  No retractions. Lungs CTAB. Gastrointestinal: Soft and nontender. No distention.  Musculoskeletal: No lower extremity tenderness nor edema.  No joint effusions. Neurologic:  Normal speech and language. No gross focal neurologic deficits are appreciated. No gait instability. Cranial nerves: 2-10 normal as tested. Cerebellar:Normal Romberg, finger-nose-finger, heel to shin, normal gait. Sensorimotor: No aphasia, pronator drift, clonus, sensory loss or abnormal reflexes.  Skin:  Skin is warm, dry and intact. No rash noted. Psychiatric: Mood and affect are normal. Speech and behavior are normal. Normal thought process and cognition.  ____________________________________________   LABS (all labs ordered are listed, but only abnormal results  are displayed)  Labs Reviewed  POC URINE PREG, ED  POCT PREGNANCY, URINE   ____________________________________________  EKG  Not indicated. ____________________________________________  RADIOLOGY  No results found. ____________________________________________   PROCEDURES  Procedure(s) performed:  Procedures  Critical Care performed: None ____________________________________________   INITIAL IMPRESSION / ASSESSMENT AND PLAN / ED COURSE  33 year old female presenting to the emergency department for treatment of headache x3 days.  Exam is overall reassuring.  Benadryl, Compazine, and Toradol to be given.  After receiving her medications, the nurse reports to me that patient requested to leave.  IV was removed and the patient ambulated out of the department.  Pertinent labs & imaging results that were available during my care of the patient were reviewed by me and considered in my medical decision making (see chart for details). ____________________________________________   FINAL CLINICAL IMPRESSION(S) / ED DIAGNOSES  Final diagnoses:  Acute intractable headache, unspecified headache type    ED Discharge Orders  None       Victorino Dike, FNP 11/12/19 2024    Lilia Pro., MD 11/13/19 [phone]

## 2019-12-12 ENCOUNTER — Encounter (HOSPITAL_COMMUNITY): Payer: Self-pay

## 2019-12-12 ENCOUNTER — Ambulatory Visit (HOSPITAL_COMMUNITY)
Admission: EM | Admit: 2019-12-12 | Discharge: 2019-12-12 | Disposition: A | Discharge status: Home / Self Care | Payer: Medicaid Other | Attending: Internal Medicine | Admitting: Internal Medicine

## 2019-12-12 ENCOUNTER — Other Ambulatory Visit: Payer: Self-pay

## 2019-12-12 DIAGNOSIS — J020 Streptococcal pharyngitis: Secondary | ICD-10-CM | POA: Diagnosis not present

## 2019-12-12 LAB — POCT RAPID STREP A: Streptococcus, Group A Screen (Direct): NEGATIVE

## 2019-12-12 MED ORDER — PENICILLIN V POTASSIUM 500 MG PO TABS: 500.0000 mg | ORAL_TABLET | Freq: Three times a day (TID) | ORAL | 0 refills | Status: AC

## 2019-12-12 NOTE — ED Triage Notes (Signed)
Patient presents to Urgent Care with complaints of sore throat since yesterday. Patient reports she takes ibuprofen for the pain and it helps, second time in the past month this has occurred.

## 2019-12-14 NOTE — ED Provider Notes (Signed)
[place]    CSN: [id number] Arrival date & time: 12/12/19  1134      History   Chief Complaint Chief Complaint  Patient presents with  . Sore Throat    HPI Diamond Cook is a 34 y.o. female with no past medical history comes to urgent care with complaints of sore throat of 1 day duration.  Patient has mild difficulty swallowing.  No fever or chills.  No nausea or vomiting.  No diarrhea.  Patient was treated for strep throat 1 month ago.  Patient says the pain is intermittent.  Currently she denies any throat pain.  She has tried ibuprofen with no improvement.   HPI  Past Medical History:  Diagnosis Date  . Anemia   . Anxiety    no meds  . Depression    no meds  . History of blood transfusion 2013   In [place]  . Hypertension   . Obesity   . Vertigo     Patient Active Problem List   Diagnosis Date Noted  . Menorrhagia with irregular cycle 12/17/2017  . Morbid obesity ([place]) 12/17/2017  . Iron deficiency anemia 12/17/2017  . Benign essential hypertension 12/17/2017    Past Surgical History:  Procedure Laterality Date  . CESAREAN SECTION  2004,2005,2012   x 3 in [place]  . DILATION AND CURETTAGE OF UTERUS N/A 02/24/2018   Procedure: DILATATION AND CURETTAGE;  Surgeon: Donnamae Jude, MD;  Location: [facility];  Service: Gynecology;  Laterality: N/A;  . INTRAUTERINE DEVICE (IUD) INSERTION N/A 02/24/2018   Procedure: INTRAUTERINE DEVICE (IUD) INSERTION;  Surgeon: Donnamae Jude, MD;  Location: [facility];  Service: Gynecology;  Laterality: N/A;  . NOVASURE ABLATION     In [place]  . TUBAL LIGATION     in [place]    OB History    Gravida  3   Para  3   Term  3   Preterm      AB      Living  3     SAB      TAB      Ectopic      Multiple      Live Births  3            Home Medications    Prior to Admission medications   Medication Sig Start Date End Date Taking? Authorizing Provider  metoprolol succinate (TOPROL-XL) 25 MG 24 hr  tablet Take 25 mg by mouth daily.   Yes [provider]  penicillin v potassium (VEETID) 500 MG tablet Take 1 tablet (500 mg total) by mouth 3 (three) times daily for 10 days. 12/12/19 12/22/19  Chase Picket, MD  ferrous sulfate 325 (65 FE) MG tablet Take 1 tablet (325 mg total) by mouth daily. Patient not taking: Reported on 10/23/2019 05/05/18 12/12/19  Mortis, Alvie Heidelberg I, PA-C  hydrochlorothiazide (HYDRODIURIL) 25 MG tablet Take 1 tablet (25 mg total) by mouth daily. Patient not taking: Reported on 10/23/2019 05/18/19 12/12/19  Fransico Meadow, PA-C    Family History Family History  Problem Relation Age of Onset  . Diabetes Maternal Grandmother   . Hypertension Maternal Grandmother   . Diabetes Maternal Aunt   . Hypertension Maternal Aunt   . Diabetes Maternal Aunt   . Hypertension Maternal Aunt   . Healthy Mother   . Asthma Father     Social History Social History   Tobacco Use  . Smoking status: Never Smoker  .  Smokeless tobacco: Never Used  Substance Use Topics  . Alcohol use: No  . Drug use: No     Allergies   Patient has no known allergies.   Review of Systems Review of Systems  HENT: Positive for sore throat. Negative for congestion, ear discharge, ear pain, facial swelling, mouth sores, rhinorrhea, sinus pressure, sinus pain and voice change.   Respiratory: Negative for cough, shortness of breath and wheezing.   Cardiovascular: Negative for chest pain.  Gastrointestinal: Negative for diarrhea, nausea and vomiting.  Genitourinary: Negative for dysuria, frequency and urgency.  Skin: Negative.      Physical Exam Triage Vital Signs ED Triage Vitals  Enc Vitals Group     BP 12/12/19 1312 (!) 120/91     Pulse Rate 12/12/19 1312 89     Resp 12/12/19 1312 17     Temp 12/12/19 1312 98.2 F (36.8 C)     Temp Source 12/12/19 1312 Oral     SpO2 12/12/19 1312 98 %     Weight --      Height --      Head Circumference --      Peak Flow --      Pain Score  12/12/19 1309 0     Pain Loc --      Pain Edu? --      Excl. in GC? --    No data found.  Updated Vital Signs BP (!) 120/91 (BP Location: Left Wrist)   Pulse 89   Temp 98.2 F (36.8 C) (Oral)   Resp 17   SpO2 98%   Visual Acuity Right Eye Distance:   Left Eye Distance:   Bilateral Distance:    Right Eye Near:   Left Eye Near:    Bilateral Near:     Physical Exam Vitals and nursing note reviewed.  Constitutional:      General: She is not in acute distress.    Appearance: She is not ill-appearing.  HENT:     Right Ear: Tympanic membrane normal.     Left Ear: Tympanic membrane normal.     Mouth/Throat:     Mouth: Mucous membranes are moist. Mucous membranes are pale.     Pharynx: Uvula midline.     Tonsils: Tonsillar exudate present. 2+ on the right. 2+ on the left.  Eyes:     Conjunctiva/sclera: Conjunctivae normal.  Cardiovascular:     Rate and Rhythm: Normal rate and regular rhythm.  Abdominal:     Palpations: Abdomen is soft.  Musculoskeletal:     Cervical back: Normal range of motion.  Skin:    Capillary Refill: Capillary refill takes less than 2 seconds.  Neurological:     General: No focal deficit present.     Mental Status: She is alert.      UC Treatments / Results  Labs (all labs ordered are listed, but only abnormal results are displayed) Labs Reviewed  CULTURE, GROUP A STREP [facility])  POCT RAPID STREP A    EKG   Radiology No results found.  Procedures Procedures (including critical care time)  Medications Ordered in UC Medications - No data to display  Initial Impression / Assessment and Plan / UC Course  I have reviewed the triage vital signs and the nursing notes.  Pertinent labs & imaging results that were available during my care of the patient were reviewed by me and considered in my medical decision making (see chart for details).    1.  Presumed streptococcal  pharyngitis: Penicillin VK 500 mg 3 times daily for 10  days Rapid strep is negative Throat culture has been sent Warm salt water gargle Ibuprofen/Tylenol as needed for pain and or fever Patient may benefit from repeat evaluation at the end of antibiotic course since I suspect that the patient may also have tonsilloliths. Final Clinical Impressions(s) / UC Diagnoses   Final diagnoses:  Strep throat   Discharge Instructions   None    ED Prescriptions    Medication Sig Dispense Auth. Provider   penicillin v potassium (VEETID) 500 MG tablet Take 1 tablet (500 mg total) by mouth 3 (three) times daily for 10 days. 30 tablet Levone Otten, Britta Mccreedy, MD     PDMP not reviewed this encounter.   Merrilee Jansky, MD 12/14/19 [phone]

## 2019-12-15 LAB — CULTURE, GROUP A STREP (THRC)

## 2020-02-29 ENCOUNTER — Other Ambulatory Visit: Payer: Self-pay

## 2020-02-29 ENCOUNTER — Emergency Department
Admission: EM | Admit: 2020-02-29 | Discharge: 2020-03-01 | Disposition: A | Discharge status: Home / Self Care | Payer: Medicaid Other | Attending: Emergency Medicine | Admitting: Emergency Medicine

## 2020-02-29 ENCOUNTER — Encounter: Payer: Self-pay | Admitting: *Deleted

## 2020-02-29 DIAGNOSIS — J351 Hypertrophy of tonsils: Secondary | ICD-10-CM | POA: Diagnosis not present

## 2020-02-29 DIAGNOSIS — J029 Acute pharyngitis, unspecified: Secondary | ICD-10-CM | POA: Diagnosis not present

## 2020-02-29 DIAGNOSIS — R509 Fever, unspecified: Secondary | ICD-10-CM | POA: Diagnosis not present

## 2020-02-29 DIAGNOSIS — Z79899 Other long term (current) drug therapy: Secondary | ICD-10-CM | POA: Insufficient documentation

## 2020-02-29 DIAGNOSIS — I10 Essential (primary) hypertension: Secondary | ICD-10-CM | POA: Insufficient documentation

## 2020-02-29 DIAGNOSIS — J358 Other chronic diseases of tonsils and adenoids: Secondary | ICD-10-CM | POA: Insufficient documentation

## 2020-02-29 MED ORDER — PENICILLIN V POTASSIUM 500 MG PO TABS
500.0000 mg | ORAL_TABLET | Freq: Once | ORAL | Status: AC
  Administered 2020-02-29: 500 mg via ORAL
  Filled 2020-02-29: qty 1

## 2020-02-29 MED ORDER — PENICILLIN V POTASSIUM 500 MG PO TABS: 500.0000 mg | ORAL_TABLET | Freq: Four times a day (QID) | ORAL | 0 refills | Status: DC

## 2020-02-29 MED ORDER — IBUPROFEN 800 MG PO TABS: 800.0000 mg | ORAL_TABLET | Freq: Three times a day (TID) | ORAL | 1 refills | Status: DC | PRN

## 2020-02-29 NOTE — ED Triage Notes (Signed)
Pt has sore throat for 4 days.  Fever yesterday.  No cough.  Pt alert.

## 2020-02-29 NOTE — ED Provider Notes (Signed)
[facility] Emergency Department Provider Note ____________________________________________  Time seen: 2300  I have reviewed the triage vital signs and the nursing notes.  HISTORY  Chief Complaint  Sore Throat  HPI Diamond Cook is a 34 y.o. female presents to the ED for 2-day complaint of sore throat pain and swelling.  Patient reports a history of recurrent episodes of tonsillitis.  Most recently she was treated with Augmentin in December.  She describes poor response to Augmentin, and subsequently had that prescription changed to amoxicillin or penicillin.  She presents today with 2 days of tonsillar swelling and exudates or stones.  She also reports interim fevers, with a T-max of 102 F yesterday.  She is been taking Tylenol and Motrin for symptom relief.  She denies any difficulty controlling oral secretions or swallowing.   Past Medical History:  Diagnosis Date  . Anemia   . Anxiety    no meds  . Depression    no meds  . History of blood transfusion 2013   In [place]  . Hypertension   . Obesity   . Vertigo     Patient Active Problem List   Diagnosis Date Noted  . Menorrhagia with irregular cycle 12/17/2017  . Morbid obesity ([place]) 12/17/2017  . Iron deficiency anemia 12/17/2017  . Benign essential hypertension 12/17/2017    Past Surgical History:  Procedure Laterality Date  . CESAREAN SECTION  2004,2005,2012   x 3 in [place]  . DILATION AND CURETTAGE OF UTERUS N/A 02/24/2018   Procedure: DILATATION AND CURETTAGE;  Surgeon: Donnamae Jude, MD;  Location: [facility];  Service: Gynecology;  Laterality: N/A;  . INTRAUTERINE DEVICE (IUD) INSERTION N/A 02/24/2018   Procedure: INTRAUTERINE DEVICE (IUD) INSERTION;  Surgeon: Donnamae Jude, MD;  Location: [facility];  Service: Gynecology;  Laterality: N/A;  . NOVASURE ABLATION     In [place]  . TUBAL LIGATION     in [place]    Prior to Admission medications   Medication Sig Start Date End Date Taking?  Authorizing Provider  ibuprofen (ADVIL) 800 MG tablet Take 1 tablet (800 mg total) by mouth every 8 (eight) hours as needed. 02/29/20   Zameria Vogl, Dannielle Karvonen, PA-C  metoprolol succinate (TOPROL-XL) 25 MG 24 hr tablet Take 25 mg by mouth daily.    [provider]  penicillin v potassium (VEETID) 500 MG tablet Take 1 tablet (500 mg total) by mouth 4 (four) times daily. 02/29/20   Viviann Broyles, Dannielle Karvonen, PA-C  ferrous sulfate 325 (65 FE) MG tablet Take 1 tablet (325 mg total) by mouth daily. Patient not taking: Reported on 10/23/2019 05/05/18 12/12/19  Mortis, Alvie Heidelberg I, PA-C  hydrochlorothiazide (HYDRODIURIL) 25 MG tablet Take 1 tablet (25 mg total) by mouth daily. Patient not taking: Reported on 10/23/2019 05/18/19 12/12/19  Fransico Meadow, PA-C    Allergies Patient has no known allergies.  Family History  Problem Relation Age of Onset  . Diabetes Maternal Grandmother   . Hypertension Maternal Grandmother   . Diabetes Maternal Aunt   . Hypertension Maternal Aunt   . Diabetes Maternal Aunt   . Hypertension Maternal Aunt   . Healthy Mother   . Asthma Father     Social History Social History   Tobacco Use  . Smoking status: Never Smoker  . Smokeless tobacco: Never Used  Substance Use Topics  . Alcohol use: No  . Drug use: No    Review of Systems  Constitutional: Positive for fever.  Eyes: Negative for visual changes. ENT: Positive for sore throat. Respiratory: Negative for shortness of breath. Gastrointestinal: Negative for abdominal pain, vomiting and diarrhea. Genitourinary: Negative for dysuria. Musculoskeletal: Negative for back pain. Skin: Negative for rash. Neurological: Negative for headaches, focal weakness or numbness. ____________________________________________  PHYSICAL EXAM:  VITAL SIGNS: ED Triage Vitals  Enc Vitals Group     BP 02/29/20 2115 (!) 130/96     Pulse Rate 02/29/20 2115 83     Resp 02/29/20 2115 18     Temp 02/29/20 2115 99.4 F  (37.4 C)     Temp Source 02/29/20 2115 Oral     SpO2 02/29/20 2115 98 %     Weight 02/29/20 2116 (!) 337 lb (152.9 kg)     Height 02/29/20 2116 5\' 7"  (1.702 m)     Head Circumference --      Peak Flow --      Pain Score 02/29/20 2116 5     Pain Loc --      Pain Edu? --      Excl. in GC? --     Constitutional: Alert and oriented. Well appearing and in no distress. Head: Normocephalic and atraumatic. Eyes: Conjunctivae are normal. Normal extraocular movements Ears: Canals clear. TMs intact bilaterally. Nose: No congestion/rhinorrhea/epistaxis. Mouth/Throat: Mucous membranes are moist.  Uvula is midline and tonsils are enlarged.  Patient's tonsils are 2+ and touching at the midline.  Tonsillar stones and exudates are appreciated. Neck: Supple. No thyromegaly. Hematological/Lymphatic/Immunological: No cervical lymphadenopathy. Cardiovascular: Normal rate, regular rhythm. Normal distal pulses. Respiratory: Normal respiratory effort. No wheezes/rales/rhonchi. Neurologic:  Normal gait without ataxia. Normal speech and language. No gross focal neurologic deficits are appreciated. Skin:  Skin is warm, dry and intact. No rash noted. ____________________________________________   LABS (pertinent positives/negatives) Labs Reviewed  GROUP A STREP BY PCR  CULTURE, GROUP A STREP [facility])  ___________________________________________  PROCEDURES  Penicillin VK 500 mg p.o.  Procedures ____________________________________________  INITIAL IMPRESSION / ASSESSMENT AND PLAN / ED COURSE  Patient with ED evaluation of acute tonsillar swelling and sore throat pain for the last 2 days but she has had fevers as well as tonsillar stones and exudates reported.  Patient is treated empirically with penicillin for strep pharyngitis.  A throat culture is pending as patient has previously failed Augmentin as a treatment option.  She had also taken at least 2 days worth of a leftover Augmentin prescription to  help alleviate symptoms prior to presentation.  Patient is encouraged to take the Pen-Vee K as directed and follow-up with her primary provider peer return precautions have been reviewed.  Diamond Cook was evaluated in Emergency Department on 02/29/2020 for the symptoms described in the history of present illness. She was evaluated in the context of the global COVID-19 pandemic, which necessitated consideration that the patient might be at risk for infection with the SARS-CoV-2 virus that causes COVID-19. Institutional protocols and algorithms that pertain to the evaluation of patients at risk for COVID-19 are in a state of rapid change based on information released by regulatory bodies including the CDC and federal and state organizations. These policies and algorithms were followed during the patient's care in the ED. ____________________________________________  FINAL CLINICAL IMPRESSION(S) / ED DIAGNOSES  Final diagnoses:  Sore throat  Acute pharyngitis, unspecified etiology      03/02/2020, Karmen Stabs, PA-C 02/29/20 2355    03/02/20, MD 03/01/20 [phone]

## 2020-02-29 NOTE — Discharge Instructions (Addendum)
Take the antibiotic as directed. Follow-up with your provider as needed.  ?

## 2020-03-01 LAB — GROUP A STREP BY PCR: Group A Strep by PCR: NOT DETECTED

## 2020-03-03 LAB — CULTURE, GROUP A STREP (THRC)

## 2020-03-28 ENCOUNTER — Ambulatory Visit: Payer: Medicaid Other

## 2020-05-03 ENCOUNTER — Encounter (HOSPITAL_COMMUNITY): Payer: Self-pay

## 2020-05-03 ENCOUNTER — Emergency Department (HOSPITAL_COMMUNITY): Payer: Medicaid Other

## 2020-05-03 ENCOUNTER — Other Ambulatory Visit: Payer: Self-pay

## 2020-05-03 ENCOUNTER — Emergency Department (HOSPITAL_COMMUNITY)
Admission: EM | Admit: 2020-05-03 | Discharge: 2020-05-04 | Disposition: A | Discharge status: Home / Self Care | Payer: Medicaid Other | Attending: Emergency Medicine | Admitting: Emergency Medicine

## 2020-05-03 DIAGNOSIS — Z5321 Procedure and treatment not carried out due to patient leaving prior to being seen by health care provider: Secondary | ICD-10-CM | POA: Diagnosis not present

## 2020-05-03 DIAGNOSIS — R002 Palpitations: Secondary | ICD-10-CM | POA: Diagnosis present

## 2020-05-03 LAB — BASIC METABOLIC PANEL
Anion gap: 10 (ref 5–15)
BUN: 15 mg/dL (ref 6–20)
CO2: 24 mmol/L (ref 22–32)
Calcium: 9 mg/dL (ref 8.9–10.3)
Chloride: 105 mmol/L (ref 98–111)
Creatinine, Ser: 0.79 mg/dL (ref 0.44–1.00)
GFR calc Af Amer: >60 mL/min (ref >60)
GFR calc non Af Amer: >60 mL/min (ref >60)
Glucose, Bld: 104 mg/dL — ABNORMAL HIGH (ref 70–99)
Potassium: 4 mmol/L (ref 3.5–5.1)
Sodium: 139 mmol/L (ref 135–145)

## 2020-05-03 LAB — CBC
HCT: 39.5 % (ref 36.0–46.0)
Hemoglobin: 12.1 g/dL (ref 12.0–15.0)
MCH: 25.4 pg — ABNORMAL LOW (ref 26.0–34.0)
MCHC: 30.6 g/dL (ref 30.0–36.0)
MCV: 83 fL (ref 80.0–100.0)
Platelets: 210 10*3/uL (ref 150–400)
RBC: 4.76 MIL/uL (ref 3.87–5.11)
RDW: 14.5 % (ref 11.5–15.5)
WBC: 6.2 10*3/uL (ref 4.0–10.5)
nRBC: 0 % (ref 0.0–0.2)

## 2020-05-03 LAB — I-STAT BETA HCG BLOOD, ED (MC, WL, AP ONLY): I-stat hCG, quantitative: <5 m[IU]/mL (ref <5)

## 2020-05-03 LAB — TROPONIN I (HIGH SENSITIVITY): Troponin I (High Sensitivity): <2 ng/L (ref <18)

## 2020-05-03 MED ORDER — SODIUM CHLORIDE 0.9% FLUSH: 3.0000 mL | Freq: Once | INTRAVENOUS | Status: DC

## 2020-05-03 NOTE — ED Triage Notes (Signed)
Pt arrives to ED w/ c/o palpitations and sob x 3 days.

## 2020-05-04 LAB — TROPONIN I (HIGH SENSITIVITY): Troponin I (High Sensitivity): 3 ng/L (ref <18)

## 2020-05-04 NOTE — ED Notes (Signed)
Pt states they are leaving  

## 2020-05-17 ENCOUNTER — Emergency Department: Payer: Medicaid Other

## 2020-05-17 ENCOUNTER — Other Ambulatory Visit: Payer: Self-pay

## 2020-05-17 ENCOUNTER — Emergency Department
Admission: EM | Admit: 2020-05-17 | Discharge: 2020-05-17 | Disposition: A | Discharge status: Home / Self Care | Payer: Medicaid Other | Attending: Emergency Medicine | Admitting: Emergency Medicine

## 2020-05-17 DIAGNOSIS — R002 Palpitations: Secondary | ICD-10-CM

## 2020-05-17 DIAGNOSIS — I10 Essential (primary) hypertension: Secondary | ICD-10-CM | POA: Diagnosis not present

## 2020-05-17 LAB — COMPREHENSIVE METABOLIC PANEL
ALT: 17 U/L (ref 0–44)
AST: 16 U/L (ref 15–41)
Albumin: 3.8 g/dL (ref 3.5–5.0)
Alkaline Phosphatase: 67 U/L (ref 38–126)
Anion gap: 8 (ref 5–15)
BUN: 15 mg/dL (ref 6–20)
CO2: 27 mmol/L (ref 22–32)
Calcium: 9.1 mg/dL (ref 8.9–10.3)
Chloride: 105 mmol/L (ref 98–111)
Creatinine, Ser: 0.88 mg/dL (ref 0.44–1.00)
GFR calc Af Amer: >60 mL/min (ref >60)
GFR calc non Af Amer: >60 mL/min (ref >60)
Glucose, Bld: 92 mg/dL (ref 70–99)
Potassium: 3.7 mmol/L (ref 3.5–5.1)
Sodium: 140 mmol/L (ref 135–145)
Total Bilirubin: 0.4 mg/dL (ref 0.3–1.2)
Total Protein: 8 g/dL (ref 6.5–8.1)

## 2020-05-17 LAB — CBC
HCT: 35.3 % — ABNORMAL LOW (ref 36.0–46.0)
Hemoglobin: 11.2 g/dL — ABNORMAL LOW (ref 12.0–15.0)
MCH: 26 pg (ref 26.0–34.0)
MCHC: 31.7 g/dL (ref 30.0–36.0)
MCV: 81.9 fL (ref 80.0–100.0)
Platelets: 201 10*3/uL (ref 150–400)
RBC: 4.31 MIL/uL (ref 3.87–5.11)
RDW: 14.3 % (ref 11.5–15.5)
WBC: 6.4 10*3/uL (ref 4.0–10.5)
nRBC: 0 % (ref 0.0–0.2)

## 2020-05-17 LAB — POCT PREGNANCY, URINE: Preg Test, Ur: NEGATIVE

## 2020-05-17 LAB — TROPONIN I (HIGH SENSITIVITY): Troponin I (High Sensitivity): <2 ng/L (ref <18)

## 2020-05-17 NOTE — Discharge Instructions (Addendum)
You should call your cardiologist to get the echocardiogram scheduled, discuss if  another Holter monitor would be helpful, discuss if they would like to adjust her medications further.  They recommended a DASH diet to decrease weight.  Please call them today to schedule a follow-up appointment.  Return to ER if you develop new or worsening symptoms or any other concerns

## 2020-05-17 NOTE — ED Triage Notes (Signed)
Pt in with co palpitation for 2 weeks, states has been seen for the same and dx with stress.

## 2020-05-17 NOTE — ED Provider Notes (Signed)
[facility] Emergency Department Provider Note  ____________________________________________   First MD Initiated Contact with Patient 05/17/20 0740     (approximate)  I have reviewed the triage vital signs and the nursing notes.   HISTORY  Chief Complaint Palpitations    HPI Diamond Cook is a 34 y.o. female with anxiety, anemia, hypertension who comes in with palpitations.  Patient reports having palpitations for years.  Patient states that she has had the same palpitations for years but states that has been worse over the past 2 weeks and she was worried that something more severe was happening.  She states that she does have a little bit of shortness of breath when the palpitations occur but then the shortness of breath resolves when the palpitations resolved.  She states that the palpitations are happening daily, nothing makes it better, and worse after getting up and moving around and then sitting back down, moderate.  She states she is had a little bit of pain in her left lower arm but has been there for 2 weeks and she was worried about a silent MI. Not pleuritic pain. Does have a bruise near forearm and not sure if related to that she says. She denies any unilateral leg swelling, not on birth control other than IUD which on review of records looks progestin in nature, no recent travel, recent surgeries, history of blood clot.  She denies history of smoking.  No family history of early cardiac arrest.  On review of records patient The patient was evaluated at [facility] ER on 07/13/2019 with heart skipping. ECG revealed normal sinus rhythm. 72-hour Holter monitor following that visit was performed which revealed predominant sinus rhythm with mean heart rate of 83 bpm, heart rate range 51 to 130 bpm, with rare PVCs and PACs. She was prescribed metoprolol succinate 25 mg daily, and had overall improvement in symptoms for several months. Pt had follow up 6/21 by cardiology  team.  Patient was placed on metoprolol 37.5 mg once daily due to increasing palpitations.  They had planned for TSH and echo to be done outpatient and f/u after echo.  Patient also had elevated D-dimer in 2019 and had a CT PE that was negative.  She does endorse having the palpitations at that time which is why they did the CT scan..         Past Medical History:  Diagnosis Date  . Anemia   . Anxiety    no meds  . Depression    no meds  . History of blood transfusion 2013   In [place]  . Hypertension   . Obesity   . Vertigo     Patient Active Problem List   Diagnosis Date Noted  . Menorrhagia with irregular cycle 12/17/2017  . Morbid obesity (HCC) 12/17/2017  . Iron deficiency anemia 12/17/2017  . Benign essential hypertension 12/17/2017    Past Surgical History:  Procedure Laterality Date  . CESAREAN SECTION  2004,2005,2012   x 3 in [place]  . DILATION AND CURETTAGE OF UTERUS N/A 02/24/2018   Procedure: DILATATION AND CURETTAGE;  Surgeon: Reva Bores, MD;  Location: [facility];  Service: Gynecology;  Laterality: N/A;  . INTRAUTERINE DEVICE (IUD) INSERTION N/A 02/24/2018   Procedure: INTRAUTERINE DEVICE (IUD) INSERTION;  Surgeon: Reva Bores, MD;  Location: [facility];  Service: Gynecology;  Laterality: N/A;  . NOVASURE ABLATION     In [place]  . TUBAL LIGATION     in [facility]  Prior to Admission medications   Medication Sig Start Date End Date Taking? Authorizing Provider  ibuprofen (ADVIL) 800 MG tablet Take 1 tablet (800 mg total) by mouth every 8 (eight) hours as needed. 02/29/20   Menshew, Charlesetta Ivory, PA-C  metoprolol succinate (TOPROL-XL) 25 MG 24 hr tablet Take 25 mg by mouth daily.    [provider]  penicillin v potassium (VEETID) 500 MG tablet Take 1 tablet (500 mg total) by mouth 4 (four) times daily. 02/29/20   Menshew, Charlesetta Ivory, PA-C  ferrous sulfate 325 (65 FE) MG tablet Take 1 tablet (325 mg total) by mouth daily. Patient not  taking: Reported on 10/23/2019 05/05/18 12/12/19  Mortis, Jerrel Ivory I, PA-C  hydrochlorothiazide (HYDRODIURIL) 25 MG tablet Take 1 tablet (25 mg total) by mouth daily. Patient not taking: Reported on 10/23/2019 05/18/19 12/12/19  Elson Areas, PA-C    Allergies Patient has no known allergies.  Family History  Problem Relation Age of Onset  . Diabetes Maternal Grandmother   . Hypertension Maternal Grandmother   . Diabetes Maternal Aunt   . Hypertension Maternal Aunt   . Diabetes Maternal Aunt   . Hypertension Maternal Aunt   . Healthy Mother   . Asthma Father     Social History Social History   Tobacco Use  . Smoking status: Never Smoker  . Smokeless tobacco: Never Used  Vaping Use  . Vaping Use: Never used  Substance Use Topics  . Alcohol use: No  . Drug use: No      Review of Systems Constitutional: No fever/chills Eyes: No visual changes. ENT: No sore throat. Cardiovascular: Denies chest pain.  Palpitations Respiratory: Shortness of breath with palpitations Gastrointestinal: No abdominal pain.  No nausea, no vomiting.  No diarrhea.  No constipation. Genitourinary: Negative for dysuria. Musculoskeletal: Negative for back pain. Skin: Negative for rash. Neurological: Negative for headaches, focal weakness or numbness. All other ROS negative ____________________________________________   PHYSICAL EXAM:  VITAL SIGNS: ED Triage Vitals  Enc Vitals Group     BP 05/17/20 0434 121/66     Pulse Rate 05/17/20 0434 70     Resp 05/17/20 0434 18     Temp 05/17/20 0434 99.1 F (37.3 C)     Temp Source 05/17/20 0434 Oral     SpO2 05/17/20 0434 98 %     Weight 05/17/20 0432 237 lb (107.5 kg)     Height 05/17/20 0432 5\' 7"  (1.702 m)     Head Circumference --      Peak Flow --      Pain Score 05/17/20 0432 0     Pain Loc --      Pain Edu? --      Excl. in GC? --     Constitutional: Alert and oriented. Well appearing and in no acute distress.  Overweight female Eyes:  Conjunctivae are normal. EOMI. Head: Atraumatic. Nose: No congestion/rhinnorhea. Mouth/Throat: Mucous membranes are moist.   Neck: No stridor. Trachea Midline. FROM Cardiovascular: Normal rate, regular rhythm. Grossly normal heart sounds.  Good peripheral circulation. Respiratory: Normal respiratory effort.  No retractions. Lungs CTAB. Gastrointestinal: Soft and nontender. No distention. No abdominal bruits.  Musculoskeletal: No lower extremity tenderness nor edema.  No joint effusions. Neurologic:  Normal speech and language. No gross focal neurologic deficits are appreciated.  Skin:  Skin is warm, dry and intact. No rash noted. Psychiatric: Mood and affect are normal. Speech and behavior are normal. GU: Deferred   ____________________________________________  LABS (all labs ordered are listed, but only abnormal results are displayed)  Labs Reviewed  CBC - Abnormal; Notable for the following components:      Result Value   Hemoglobin 11.2 (*)    HCT 35.3 (*)    All other components within normal limits  COMPREHENSIVE METABOLIC PANEL  POC URINE PREG, ED  POCT PREGNANCY, URINE  TROPONIN I (HIGH SENSITIVITY)   ____________________________________________   ED ECG REPORT I, Concha SeMary E Jiyah Torpey, the attending physician, personally viewed and interpreted this ECG.  Normal sinus rate of 68, no ST elevation, no T wave inversions, normal intervals ____________________________________________  RADIOLOGY Vela ProseI, Jguadalupe Opiela E Dwanna Goshert, personally viewed and evaluated these images (plain radiographs) as part of my medical decision making, as well as reviewing the written report by the radiologist.  ED MD interpretation:  No pna   Official radiology report(s): DG Chest 2 View  Result Date: 05/17/2020 CLINICAL DATA:  Palpitations. EXAM: CHEST - 2 VIEW COMPARISON:  May 03, 2020. FINDINGS: The heart size and mediastinal contours are within normal limits. Both lungs are clear. No pneumothorax or pleural  effusion is noted. The visualized skeletal structures are unremarkable. IMPRESSION: No active cardiopulmonary disease. Electronically Signed   By: Lupita RaiderJames  Green Jr M.D.   On: 05/17/2020 08:49    ____________________________________________   PROCEDURES  Procedure(s) performed (including Critical Care):  .1-3 Lead EKG Interpretation Performed by: Concha SeFunke, Lotta Frankenfield E, MD Authorized by: Concha SeFunke, Rahil Passey E, MD     Interpretation: normal     ECG rate:  70s    ECG rate assessment: normal     Rhythm: sinus rhythm     Ectopy: none     Conduction: normal       ____________________________________________   INITIAL IMPRESSION / ASSESSMENT AND PLAN / ED COURSE  Diamond Cook was evaluated in Emergency Department on 05/17/2020 for the symptoms described in the history of present illness. She was evaluated in the context of the global COVID-19 pandemic, which necessitated consideration that the patient might be at risk for infection with the SARS-CoV-2 virus that causes COVID-19. Institutional protocols and algorithms that pertain to the evaluation of patients at risk for COVID-19 are in a state of rapid change based on information released by regulatory bodies including the CDC and federal and state organizations. These policies and algorithms were followed during the patient's care in the ED.    Patient is obese 34 year old who comes in with palpitations.  Patient had this for years now has had  work-up in the ER as well as with cardiology.  She was diagnosed with PVCs and PACs and placed on metoprolol.  Patient was on the cardiac monitor and states she had a little bit of fluttering I was talking to her but patient was in normal sinus rhythm.  Given the new arm pain will get cardiac markers to make sure no ACS.  Symptoms of been going on for greater than 3 hours so should be able get 1 troponin as long as less than 5.  Will get chest x-ray to make sure no pneumothorax, pleural effusion.  Patient is PERC negative  and I have low suspicion for PE at this time.  I discussed with patient that she is already had CT imaging in the past to evaluate for PE and she has no new risk factors to suggest needing further work-up for this given these palpitations have been going on for years.  Will get labs to make sure no anemia.  Denies any  other infectious symptoms.  Patient is very worried about having a heart attack.  Discussed with patient the best way to prevent heart attack is modifying risk factors.  We discussed doing a Dash diet, making sure her lipids and sugars are well controlled etc.  I discussed these of things at the cardiologist or primary care doctor can follow-up with her.  Was discussed the importance of letting the cardiology team know that the palpitations are still going on even with increase of her metoprolol.  Patient may benefit from repeat Holter to see if she is actually still having increased PVCs and PACs burden and explained to patient she needs to get the echocardiogram done today had recommended.  Patient expressed understanding she will call her cardiologist later today.   No white count to suggest infection. Hemoglobin is 11.2 which is around patient's baseline Cardiac markers negative  Patient be discharged home at this time with follow-up with cardiology  I discussed the provisional nature of ED diagnosis, the treatment so far, the ongoing plan of care, follow up appointments and return precautions with the patient and any family or support people present. They expressed understanding and agreed with the plan, discharged home.          ____________________________________________   FINAL CLINICAL IMPRESSION(S) / ED DIAGNOSES   Final diagnoses:  Palpitations      MEDICATIONS GIVEN DURING THIS VISIT:  Medications - No data to display   ED Discharge Orders    None       Note:  This document was prepared using Dragon voice recognition software and may include  unintentional dictation errors.   Concha Se, MD 05/17/20 [phone]

## 2020-05-17 NOTE — ED Notes (Signed)
Ok to d/c 2nd trop per Dr. Fuller Plan

## 2020-05-17 NOTE — ED Notes (Signed)
Pt otf for CXR

## 2020-05-31 ENCOUNTER — Other Ambulatory Visit: Payer: Self-pay | Admitting: Podiatry

## 2020-07-08 ENCOUNTER — Emergency Department: Payer: Medicaid Other

## 2020-07-08 ENCOUNTER — Other Ambulatory Visit: Payer: Self-pay

## 2020-07-08 ENCOUNTER — Emergency Department
Admission: EM | Admit: 2020-07-08 | Discharge: 2020-07-08 | Disposition: A | Discharge status: Home / Self Care | Payer: Medicaid Other | Attending: Emergency Medicine | Admitting: Emergency Medicine

## 2020-07-08 DIAGNOSIS — U071 COVID-19: Secondary | ICD-10-CM | POA: Diagnosis not present

## 2020-07-08 DIAGNOSIS — R042 Hemoptysis: Secondary | ICD-10-CM | POA: Diagnosis present

## 2020-07-08 DIAGNOSIS — I10 Essential (primary) hypertension: Secondary | ICD-10-CM | POA: Insufficient documentation

## 2020-07-08 DIAGNOSIS — Z79899 Other long term (current) drug therapy: Secondary | ICD-10-CM | POA: Diagnosis not present

## 2020-07-08 MED ORDER — ALBUTEROL SULFATE HFA 108 (90 BASE) MCG/ACT IN AERS
2.0000 | INHALATION_SPRAY | RESPIRATORY_TRACT | 0 refills | Status: DC | PRN
Start: 2020-07-08 — End: 2021-09-10

## 2020-07-08 MED ORDER — PSEUDOEPH-BROMPHEN-DM 30-2-10 MG/5ML PO SYRP
10.0000 mL | ORAL_SOLUTION | Freq: Four times a day (QID) | ORAL | 0 refills | Status: DC | PRN
Start: 2020-07-08 — End: 2021-05-24

## 2020-07-08 MED ORDER — PREDNISONE 10 MG PO TABS
10.0000 mg | ORAL_TABLET | Freq: Every day | ORAL | 0 refills | Status: DC
Start: 2020-07-08 — End: 2021-05-24

## 2020-07-08 MED ORDER — ONDANSETRON 8 MG PO TBDP
8.0000 mg | ORAL_TABLET | Freq: Once | ORAL | Status: DC
  Filled 2020-07-08: qty 1

## 2020-07-08 MED ORDER — KETOROLAC TROMETHAMINE 30 MG/ML IJ SOLN
30.0000 mg | Freq: Once | INTRAMUSCULAR | Status: AC
  Administered 2020-07-08: 30 mg via INTRAMUSCULAR
  Filled 2020-07-08: qty 1

## 2020-07-08 MED ORDER — DEXAMETHASONE SODIUM PHOSPHATE 10 MG/ML IJ SOLN
10.0000 mg | Freq: Once | INTRAMUSCULAR | Status: AC
  Administered 2020-07-08: 10 mg via INTRAMUSCULAR
  Filled 2020-07-08: qty 1

## 2020-07-08 MED ORDER — BUTALBITAL-APAP-CAFFEINE 50-325-40 MG PO TABS: 1.0000 | ORAL_TABLET | Freq: Four times a day (QID) | ORAL | 0 refills | Status: DC | PRN

## 2020-07-08 NOTE — ED Provider Notes (Signed)
[facility] Emergency Department Provider Note  ____________________________________________  Time seen: Approximately 5:58 PM  I have reviewed the triage vital signs and the nursing notes.   HISTORY  Chief Complaint Cough and Hemoptysis    HPI Diamond Cook is a 34 y.o. female who presents the emergency department for evaluation of hemoptysis and headache in the setting of Covid.  Patient states that she tested +5 days ago for COVID-19.  She has had body aches, fevers, nasal congestion, cough.  She has developed mild hemoptysis with a few spots of blood with coughing.  No frank blood.  No history of same.  Patient is not taking any medication for Covid symptoms with the exception of Tylenol and Motrin.  She is also endorsing a migraine headache that has been present since her Covid symptoms.  She states that the Tylenol Motrin is not effective in relieving her headache.  No visual changes.  No neck pain or stiffness.  No chest pain, abdominal pain, nausea vomiting or diarrhea.  Patient has a history of anemia, anxiety, depression, hypertension, obesity.  No complaints of chronic medical issues.         Past Medical History:  Diagnosis Date  . Anemia   . Anxiety    no meds  . Depression    no meds  . History of blood transfusion 2013   In [place]  . Hypertension   . Obesity   . Vertigo     Patient Active Problem List   Diagnosis Date Noted  . Menorrhagia with irregular cycle 12/17/2017  . Morbid obesity (HCC) 12/17/2017  . Iron deficiency anemia 12/17/2017  . Benign essential hypertension 12/17/2017    Past Surgical History:  Procedure Laterality Date  . CESAREAN SECTION  2004,2005,2012   x 3 in [place]  . DILATION AND CURETTAGE OF UTERUS N/A 02/24/2018   Procedure: DILATATION AND CURETTAGE;  Surgeon: Reva Bores, MD;  Location: [facility];  Service: Gynecology;  Laterality: N/A;  . INTRAUTERINE DEVICE (IUD) INSERTION N/A 02/24/2018   Procedure:  INTRAUTERINE DEVICE (IUD) INSERTION;  Surgeon: Reva Bores, MD;  Location: [facility];  Service: Gynecology;  Laterality: N/A;  . NOVASURE ABLATION     In [place]  . TUBAL LIGATION     in [place]    Prior to Admission medications   Medication Sig Start Date End Date Taking? Authorizing Provider  albuterol (VENTOLIN HFA) 108 (90 Base) MCG/ACT inhaler Inhale 2 puffs into the lungs every 4 (four) hours as needed for wheezing or shortness of breath. 07/08/20   Morrigan Wickens, Delorise Royals, PA-C  brompheniramine-pseudoephedrine-DM 30-2-10 MG/5ML syrup Take 10 mLs by mouth 4 (four) times daily as needed. 07/08/20   Courtni Balash, Delorise Royals, PA-C  butalbital-acetaminophen-caffeine (FIORICET) [phone] MG tablet Take 1-2 tablets by mouth every 6 (six) hours as needed for headache. 07/08/20 07/08/21  Donnika Kucher, Delorise Royals, PA-C  ibuprofen (ADVIL) 800 MG tablet Take 1 tablet (800 mg total) by mouth every 8 (eight) hours as needed. Patient not taking: Reported on 05/17/2020 02/29/20   Menshew, Charlesetta Ivory, PA-C  metoprolol succinate (TOPROL-XL) 25 MG 24 hr tablet Take 25 mg by mouth daily.    [provider]  penicillin v potassium (VEETID) 500 MG tablet Take 1 tablet (500 mg total) by mouth 4 (four) times daily. Patient not taking: Reported on 05/17/2020 02/29/20   Menshew, Charlesetta Ivory, PA-C  predniSONE (DELTASONE) 10 MG tablet Take 1 tablet (10 mg total) by mouth daily. 07/08/20  Onur Mori, Delorise Royals, PA-C  ferrous sulfate 325 (65 FE) MG tablet Take 1 tablet (325 mg total) by mouth daily. Patient not taking: Reported on 10/23/2019 05/05/18 12/12/19  Mortis, Jerrel Ivory I, PA-C  hydrochlorothiazide (HYDRODIURIL) 25 MG tablet Take 1 tablet (25 mg total) by mouth daily. Patient not taking: Reported on 10/23/2019 05/18/19 12/12/19  Elson Areas, PA-C    Allergies Patient has no known allergies.  Family History  Problem Relation Age of Onset  . Diabetes Maternal Grandmother   . Hypertension Maternal  Grandmother   . Diabetes Maternal Aunt   . Hypertension Maternal Aunt   . Diabetes Maternal Aunt   . Hypertension Maternal Aunt   . Healthy Mother   . Asthma Father     Social History Social History   Tobacco Use  . Smoking status: Never Smoker  . Smokeless tobacco: Never Used  Vaping Use  . Vaping Use: Never used  Substance Use Topics  . Alcohol use: No  . Drug use: No     Review of Systems  Constitutional: Positive fever/chills Eyes: No visual changes. No discharge ENT: No upper respiratory complaints. Cardiovascular: no chest pain. Respiratory: Positive for cough with mild hemoptysis.. No SOB. Gastrointestinal: No abdominal pain.  No nausea, no vomiting.  No diarrhea.  No constipation. Genitourinary: Negative for dysuria. No hematuria Musculoskeletal: Negative for musculoskeletal pain. Skin: Negative for rash, abrasions, lacerations, ecchymosis. Neurological: Positive for 1 week of headache, denies focal weakness or numbness. 10-point ROS otherwise negative.  ____________________________________________   PHYSICAL EXAM:  VITAL SIGNS: ED Triage Vitals [07/08/20 1553]  Enc Vitals Group     BP 128/85     Pulse Rate 97     Resp 18     Temp 99 F (37.2 C)     Temp Source Oral     SpO2 99 %     Weight (!) 325 lb (147.4 kg)     Height 5\' 7"  (1.702 m)     Head Circumference      Peak Flow      Pain Score      Pain Loc      Pain Edu?      Excl. in GC?      Constitutional: Alert and oriented. Well appearing and in no acute distress. Eyes: Conjunctivae are normal. PERRL. EOMI. Head: Atraumatic. ENT:      Ears:       Nose: No congestion/rhinnorhea.      Mouth/Throat: Mucous membranes are moist.  Neck: No stridor.  Neck is supple [place] motion Hematological/Lymphatic/Immunilogical: No cervical lymphadenopathy. Cardiovascular: Normal rate, regular rhythm. Normal S1 and S2.  Good peripheral circulation. Respiratory: Normal respiratory effort without  tachypnea or retractions. Lungs CTAB with no wheezing, rales or rhonchi. Good air entry to the bases with no decreased or absent breath sounds. Musculoskeletal: Full range of motion to all extremities. No gross deformities appreciated. Neurologic:  Normal speech and language. No gross focal neurologic deficits are appreciated.  Cranial nerves II through XII grossly intact. Skin:  Skin is warm, dry and intact. No rash noted. Psychiatric: Mood and affect are normal. Speech and behavior are normal. Patient exhibits appropriate insight and judgement.   ____________________________________________   LABS (all labs ordered are listed, but only abnormal results are displayed)  Labs Reviewed - No data to display ____________________________________________  EKG   ____________________________________________  RADIOLOGY I personally viewed and evaluated these images as part of my medical decision making, as well as reviewing the written report  by the radiologist.  DG Chest 1 View  Result Date: 07/08/2020 CLINICAL DATA:  Cough.  COVID positive. EXAM: CHEST  1 VIEW COMPARISON:  05/17/2020 and prior radiographs FINDINGS: The cardiomediastinal silhouette is unremarkable. Mild chronic peribronchial thickening again noted. There is no evidence of focal airspace disease, pulmonary edema, suspicious pulmonary nodule/mass, pleural effusion, or pneumothorax. No acute bony abnormalities are identified. IMPRESSION: No active disease. Electronically Signed   By: Harmon Pier M.D.   On: 07/08/2020 18:48    ____________________________________________    PROCEDURES  Procedure(s) performed:    Procedures    Medications  ketorolac (TORADOL) 30 MG/ML injection 30 mg (has no administration in time range)  dexamethasone (DECADRON) injection 10 mg (has no administration in time range)  ondansetron (ZOFRAN-ODT) disintegrating tablet 8 mg (has no administration in time range)      ____________________________________________   INITIAL IMPRESSION / ASSESSMENT AND PLAN / ED COURSE  Pertinent labs & imaging results that were available during my care of the patient were reviewed by me and considered in my medical decision making (see chart for details).  Review of the [place] CSRS was performed in accordance of the [facility] prior to dispensing any controlled drugs.           Patient's diagnosis is consistent with COVID-19, hemoptysis.  Patient presented to the emergency department complaining of some blood in her sputum from coughing.  Patient is Covid positive and tested +5 days ago.  Overall exam was reassuring.  Chest x-ray without any evidence of cavitary lesions or other areas of concern.  This time patient has very little amount of blood in the sputum.  I feel this is likely irritation secondary to COVID-19.  Patient will have steroids, albuterol, Bromfed cough syrup and Fioricet for symptom relief.  Patient has had a migraine headache for a week in addition to her Covid symptoms.  Patient was driving and unfortunately could not find someone to drive her so unfortunately the migraine cocktail was unable to be administered.  I did give the patient Zofran, Toradol, Decadron to hopefully alleviate some of the hemoptysis as well as some headache.  Patient will be prescribed Fioricet for additional headache relief to be taken at home.  Return precautions are discussed with the patient.  Patient stable at this time.  No indication for further work-up.  Follow-up primary care as needed..  Patient is given ED precautions to return to the ED for any worsening or new symptoms.     ____________________________________________  FINAL CLINICAL IMPRESSION(S) / ED DIAGNOSES  Final diagnoses:  COVID-19  Hemoptysis      NEW MEDICATIONS STARTED DURING THIS VISIT:  ED Discharge Orders         Ordered    predniSONE (DELTASONE) 10 MG tablet  Daily       Note to Pharmacy: Take 6  pills x 2 days, 5 pills x 2 days, 4 pills x 2 days, 3 pills x 2 days, 2 pills x 2 days, and 1 pill x 2 days   07/08/20 1930    albuterol (VENTOLIN HFA) 108 (90 Base) MCG/ACT inhaler  Every 4 hours PRN        07/08/20 1930    brompheniramine-pseudoephedrine-DM 30-2-10 MG/5ML syrup  4 times daily PRN        07/08/20 1930    butalbital-acetaminophen-caffeine (FIORICET) 50-325-40 MG tablet  Every 6 hours PRN        07/08/20 1930  This chart was dictated using voice recognition software/Dragon. Despite best efforts to proofread, errors can occur which can change the meaning. Any change was purely unintentional.    Racheal PatchesCuthriell, Anndrea Mihelich D, PA-C 07/08/20 1933    Chesley NoonJessup, Charles, MD 07/08/20 2115

## 2020-07-08 NOTE — ED Triage Notes (Signed)
Pt presents via POV c/o cough with scant blood. Report + Covid.

## 2020-07-14 ENCOUNTER — Other Ambulatory Visit: Payer: Self-pay

## 2020-07-14 ENCOUNTER — Emergency Department: Payer: Medicaid Other

## 2020-07-14 DIAGNOSIS — I10 Essential (primary) hypertension: Secondary | ICD-10-CM | POA: Diagnosis not present

## 2020-07-14 DIAGNOSIS — J1282 Pneumonia due to coronavirus disease 2019: Secondary | ICD-10-CM | POA: Insufficient documentation

## 2020-07-14 DIAGNOSIS — R0789 Other chest pain: Secondary | ICD-10-CM | POA: Diagnosis present

## 2020-07-14 DIAGNOSIS — U071 COVID-19: Secondary | ICD-10-CM | POA: Insufficient documentation

## 2020-07-14 LAB — CBC
HCT: 38.4 % (ref 36.0–46.0)
Hemoglobin: 12.5 g/dL (ref 12.0–15.0)
MCH: 26.3 pg (ref 26.0–34.0)
MCHC: 32.6 g/dL (ref 30.0–36.0)
MCV: 80.8 fL (ref 80.0–100.0)
Platelets: 189 10*3/uL (ref 150–400)
RBC: 4.75 MIL/uL (ref 3.87–5.11)
RDW: 14.3 % (ref 11.5–15.5)
WBC: 5.3 10*3/uL (ref 4.0–10.5)
nRBC: 0 % (ref 0.0–0.2)

## 2020-07-14 LAB — BASIC METABOLIC PANEL
Anion gap: 12 (ref 5–15)
BUN: 19 mg/dL (ref 6–20)
CO2: 28 mmol/L (ref 22–32)
Calcium: 8.8 mg/dL — ABNORMAL LOW (ref 8.9–10.3)
Chloride: 102 mmol/L (ref 98–111)
Creatinine, Ser: 1.09 mg/dL — ABNORMAL HIGH (ref 0.44–1.00)
GFR calc Af Amer: >60 mL/min (ref >60)
GFR calc non Af Amer: >60 mL/min (ref >60)
Glucose, Bld: 99 mg/dL (ref 70–99)
Potassium: 3.2 mmol/L — ABNORMAL LOW (ref 3.5–5.1)
Sodium: 142 mmol/L (ref 135–145)

## 2020-07-14 LAB — TROPONIN I (HIGH SENSITIVITY): Troponin I (High Sensitivity): <2 ng/L (ref <18)

## 2020-07-14 NOTE — ED Triage Notes (Signed)
Patient reports tested COVID + approximately 12 days ago and couple hours ago her "body began to heat up" and upper chest tightness and headache.

## 2020-07-15 ENCOUNTER — Emergency Department
Admission: EM | Admit: 2020-07-15 | Discharge: 2020-07-15 | Disposition: A | Discharge status: Home / Self Care | Payer: Medicaid Other | Attending: Emergency Medicine | Admitting: Emergency Medicine

## 2020-07-15 ENCOUNTER — Encounter: Payer: Self-pay | Admitting: Radiology

## 2020-07-15 ENCOUNTER — Emergency Department: Payer: Medicaid Other

## 2020-07-15 DIAGNOSIS — J1282 Pneumonia due to coronavirus disease 2019: Secondary | ICD-10-CM

## 2020-07-15 LAB — TROPONIN I (HIGH SENSITIVITY): Troponin I (High Sensitivity): 2 ng/L (ref <18)

## 2020-07-15 LAB — FIBRIN DERIVATIVES D-DIMER (ARMC ONLY): Fibrin derivatives D-dimer (ARMC): 779.19 ng/mL (FEU) — ABNORMAL HIGH (ref 0.00–499.00)

## 2020-07-15 MED ORDER — ALBUTEROL SULFATE (2.5 MG/3ML) 0.083% IN NEBU
5.0000 mg | INHALATION_SOLUTION | Freq: Once | RESPIRATORY_TRACT | Status: AC
  Administered 2020-07-15: 5 mg via RESPIRATORY_TRACT
  Filled 2020-07-15: qty 6

## 2020-07-15 MED ORDER — SODIUM CHLORIDE 0.9 % IV BOLUS
1000.0000 mL | Freq: Once | INTRAVENOUS | Status: AC
  Administered 2020-07-15: 1000 mL via INTRAVENOUS

## 2020-07-15 MED ORDER — IOHEXOL 350 MG/ML SOLN
100.0000 mL | Freq: Once | INTRAVENOUS | Status: AC | PRN
  Administered 2020-07-15: 100 mL via INTRAVENOUS

## 2020-07-15 MED ORDER — KETOROLAC TROMETHAMINE 30 MG/ML IJ SOLN
15.0000 mg | Freq: Once | INTRAMUSCULAR | Status: AC
  Administered 2020-07-15: 15 mg via INTRAVENOUS
  Filled 2020-07-15: qty 1

## 2020-07-15 NOTE — ED Notes (Signed)
Patient resting quietly at this time.

## 2020-07-15 NOTE — ED Provider Notes (Signed)
[facility] Emergency Department Provider Note  ____________________________________________  Time seen: Approximately 1:23 AM  I have reviewed the triage vital signs and the nursing notes.   HISTORY  Chief Complaint Chest Pain   HPI Diamond Cook is a 34 y.o. female with a history of hypertension, obesity, anemia, anxiety who presents for evaluation of chest tightness.  Patient reports that she purchased a home test for Covid and took it 12 days ago.  The test was positive.  She initially had chills, cough, fever, body aches.  Her symptoms have mostly resolved.  Today she started having some tightness in her chest and feeling heat coming from her chest which prompted her visit to the ED.   she is also complaining of mild frontal pressure-like headache that she describes as similar to a sinus headache.  She denies any cough or fever, no chest pain or shortness of breath, no personal or family history of PE or DVT, no recent travel immobilization, no leg pain or swelling, no hemoptysis or exogenous hormones.  No history of asthma or COPD.  Past Medical History:  Diagnosis Date  . Anemia   . Anxiety    no meds  . Depression    no meds  . History of blood transfusion 2013   In [place]  . Hypertension   . Obesity   . Vertigo     Patient Active Problem List   Diagnosis Date Noted  . Menorrhagia with irregular cycle 12/17/2017  . Morbid obesity (HCC) 12/17/2017  . Iron deficiency anemia 12/17/2017  . Benign essential hypertension 12/17/2017    Past Surgical History:  Procedure Laterality Date  . CESAREAN SECTION  2004,2005,2012   x 3 in [place]  . DILATION AND CURETTAGE OF UTERUS N/A 02/24/2018   Procedure: DILATATION AND CURETTAGE;  Surgeon: Reva Bores, MD;  Location: [facility];  Service: Gynecology;  Laterality: N/A;  . INTRAUTERINE DEVICE (IUD) INSERTION N/A 02/24/2018   Procedure: INTRAUTERINE DEVICE (IUD) INSERTION;  Surgeon: Reva Bores, MD;   Location: [facility];  Service: Gynecology;  Laterality: N/A;  . NOVASURE ABLATION     In [place]  . TUBAL LIGATION     in [place]    Prior to Admission medications   Medication Sig Start Date End Date Taking? Authorizing Provider  albuterol (VENTOLIN HFA) 108 (90 Base) MCG/ACT inhaler Inhale 2 puffs into the lungs every 4 (four) hours as needed for wheezing or shortness of breath. 07/08/20   Cuthriell, Delorise Royals, PA-C  brompheniramine-pseudoephedrine-DM 30-2-10 MG/5ML syrup Take 10 mLs by mouth 4 (four) times daily as needed. 07/08/20   Cuthriell, Delorise Royals, PA-C  butalbital-acetaminophen-caffeine (FIORICET) [phone] MG tablet Take 1-2 tablets by mouth every 6 (six) hours as needed for headache. 07/08/20 07/08/21  Cuthriell, Delorise Royals, PA-C  ibuprofen (ADVIL) 800 MG tablet Take 1 tablet (800 mg total) by mouth every 8 (eight) hours as needed. Patient not taking: Reported on 05/17/2020 02/29/20   Menshew, Charlesetta Ivory, PA-C  metoprolol succinate (TOPROL-XL) 25 MG 24 hr tablet Take 25 mg by mouth daily.    [provider]  penicillin v potassium (VEETID) 500 MG tablet Take 1 tablet (500 mg total) by mouth 4 (four) times daily. Patient not taking: Reported on 05/17/2020 02/29/20   Menshew, Charlesetta Ivory, PA-C  predniSONE (DELTASONE) 10 MG tablet Take 1 tablet (10 mg total) by mouth daily. 07/08/20   Cuthriell, Delorise Royals, PA-C  ferrous sulfate 325 (65 FE) MG  tablet Take 1 tablet (325 mg total) by mouth daily. Patient not taking: Reported on 10/23/2019 05/05/18 12/12/19  Mortis, Jerrel IvoryGabrielle I, PA-C  hydrochlorothiazide (HYDRODIURIL) 25 MG tablet Take 1 tablet (25 mg total) by mouth daily. Patient not taking: Reported on 10/23/2019 05/18/19 12/12/19  Elson AreasSofia, Leslie K, PA-C    Allergies Patient has no known allergies.  Family History  Problem Relation Age of Onset  . Diabetes Maternal Grandmother   . Hypertension Maternal Grandmother   . Diabetes Maternal Aunt   . Hypertension Maternal Aunt   .  Diabetes Maternal Aunt   . Hypertension Maternal Aunt   . Healthy Mother   . Asthma Father     Social History Social History   Tobacco Use  . Smoking status: Never Smoker  . Smokeless tobacco: Never Used  Vaping Use  . Vaping Use: Never used  Substance Use Topics  . Alcohol use: No  . Drug use: No    Review of Systems  Constitutional: Negative for fever. Eyes: Negative for visual changes. ENT: Negative for sore throat. Neck: No neck pain  Cardiovascular: Negative for chest pain. + chest tightness Respiratory: Negative for shortness of breath. Gastrointestinal: Negative for abdominal pain, vomiting or diarrhea. Genitourinary: Negative for dysuria. Musculoskeletal: Negative for back pain. Skin: Negative for rash. Neurological: Negative for weakness or numbness. + HA Psych: No SI or HI  ____________________________________________   PHYSICAL EXAM:  VITAL SIGNS: ED Triage Vitals  Enc Vitals Group     BP 07/14/20 2219 119/90     Pulse Rate 07/14/20 2219 83     Resp 07/14/20 2219 20     Temp 07/14/20 2219 98.3 F (36.8 C)     Temp Source 07/14/20 2219 Oral     SpO2 07/14/20 2219 98 %     Weight 07/14/20 2218 220 lb (99.8 kg)     Height 07/14/20 2218 5\' 7"  (1.702 m)     Head Circumference --      Peak Flow --      Pain Score 07/14/20 2218 5     Pain Loc --      Pain Edu? --      Excl. in GC? --     Constitutional: Alert and oriented. Well appearing and in no apparent distress. HEENT:      Head: Normocephalic and atraumatic.         Eyes: Conjunctivae are normal. Sclera is non-icteric.       Mouth/Throat: Mucous membranes are moist.       Neck: Supple with no signs of meningismus. Cardiovascular: Regular rate and rhythm. No murmurs, gallops, or rubs. 2+ symmetrical distal pulses are present in all extremities. No JVD. Respiratory: Normal respiratory effort. Lungs are clear to auscultation bilaterally. No wheezes, crackles, or rhonchi.  Slight diminished air  movement bilaterally Gastrointestinal: Soft, non tender. Musculoskeletal: No edema, cyanosis, or erythema of extremities. Neurologic: Normal speech and language. Face is symmetric. Moving all extremities. No gross focal neurologic deficits are appreciated. Skin: Skin is warm, dry and intact. No rash noted. Psychiatric: Mood and affect are normal. Speech and behavior are normal.  ____________________________________________   LABS (all labs ordered are listed, but only abnormal results are displayed)  Labs Reviewed  BASIC METABOLIC PANEL - Abnormal; Notable for the following components:      Result Value   Potassium 3.2 (*)    Creatinine, Ser 1.09 (*)    Calcium 8.8 (*)    All other components within normal limits  FIBRIN DERIVATIVES D-DIMER ([facility] ONLY) - Abnormal; Notable for the following components:   Fibrin derivatives D-dimer ([facility]) 779.19 (*)    All other components within normal limits  CBC  POC URINE PREG, ED  TROPONIN I (HIGH SENSITIVITY)  TROPONIN I (HIGH SENSITIVITY)   ____________________________________________  EKG  ED ECG REPORT I, Nita Sickle, the attending physician, personally viewed and interpreted this ECG.  Normal sinus rhythm, rate of 75, normal intervals, normal axis, no ST elevations or depressions.  Normal EKG. ____________________________________________  RADIOLOGY  I have personally reviewed the images performed during this visit and I agree with the Radiologist's read.   Interpretation by Radiologist:  DG Chest 2 View  Result Date: 07/14/2020 CLINICAL DATA:  COVID positive with chest pain. EXAM: CHEST - 2 VIEW COMPARISON:  July 08, 2020 FINDINGS: There is no evidence of acute infiltrate, pleural effusion or pneumothorax. The heart size and mediastinal contours are within normal limits. The visualized skeletal structures are unremarkable. IMPRESSION: No active cardiopulmonary disease. Electronically Signed   By: Aram Candela M.D.    On: 07/14/2020 23:17   CT Angio Chest PE W and/or Wo Contrast  Result Date: 07/15/2020 CLINICAL DATA:  Evaluate for pulmonary embolus. Positive D-dimer. Tested positive for COVID 12 days ago. Upper chest tightness and headache. EXAM: CT ANGIOGRAPHY CHEST WITH CONTRAST TECHNIQUE: Multidetector CT imaging of the chest was performed using the standard protocol during bolus administration of intravenous contrast. Multiplanar CT image reconstructions and MIPs were obtained to evaluate the vascular anatomy. CONTRAST:  OMNIPAQUE IOHEXOL 350 MG/ML SOLN COMPARISON:  08/06/2018 FINDINGS: Cardiovascular: The main pulmonary artery appears patent. No central obstructing pulmonary embolus identified. Exam detail at the level of the distal lobar, segmental and subsegmental pulmonary arteries is diminished secondary to motion artifact. Mediastinum/Nodes: No enlarged mediastinal, hilar, or axillary lymph nodes. Thyroid gland, trachea, and esophagus demonstrate no significant findings. Lungs/Pleura: No pleural effusion identified. Bilateral upper and lower lobe peripheral predominant ground-glass densities noted. Interstitial prominence is noted within the lung bases. Upper Abdomen: No acute abnormality. Musculoskeletal: No chest wall abnormality. No acute or significant osseous findings. Review of the MIP images confirms the above findings. IMPRESSION: 1. Exam detail at the level of the distal lobar, segmental and subsegmental pulmonary arteries is diminished secondary to motion artifact. No central obstructing pulmonary embolus identified. 2. Bilateral upper and lower lobe peripheral predominant ground-glass densities and interstitial prominence. Findings are compatible with COVID-19 pneumonia. Electronically Signed   By: Signa Kell M.D.   On: 07/15/2020 05:55     ____________________________________________   PROCEDURES  Procedure(s) performed:yes .1-3 Lead EKG Interpretation Performed by: Nita Sickle, MD Authorized by: Nita Sickle, MD     Interpretation: normal     ECG rate assessment: normal     Rhythm: sinus rhythm     Ectopy: none     Critical Care performed:  None ____________________________________________   INITIAL IMPRESSION / ASSESSMENT AND PLAN / ED COURSE  34 y.o. female with a history of hypertension, obesity, anemia, anxiety who presents for evaluation of chest tightness, feeling hot, and frontal HA in the setting of + Covid 12 days ago.  Patient is extremely well-appearing in no distress with normal vital signs, neurologically intact, lungs are clear to auscultation with no wheezing or crackles but she does have slightly diminished air movement.  EKG with no signs of ischemia or dysrhythmias.  Troponins are negative with no signs of Covid myocarditis.  Chest x-ray showing no infiltrate or edema, confirmed by  radiology.  We will check a D-dimer to rule out a PE in the setting of positive Covid.  Will give albuterol treatment.  Patient slightly dehydrated with slight bump on her creatinine will give IV fluids and Toradol for her headache.  Remainder of her blood work is within normal limits.  Old medical records reviewed.  Patient placed on telemetry for close monitoring.  _________________________ 6:01 AM on 07/15/2020 -----------------------------------------  D-dimer elevated therefore patient was sent for CT angio which shows Covid pneumonia but no evidence of PE.  Patient remains with normal work of breathing, no hypoxia both at rest and with ambulation.  Will discharge home with supportive care follow-up with primary care doctor.  Discussed my standard return precautions and quarantine.     _____________________________________________ Please note:  Patient was evaluated in Emergency Department today for the symptoms described in the history of present illness. Patient was evaluated in the context of the global COVID-19 pandemic, which necessitated  consideration that the patient might be at risk for infection with the SARS-CoV-2 virus that causes COVID-19. Institutional protocols and algorithms that pertain to the evaluation of patients at risk for COVID-19 are in a state of rapid change based on information released by regulatory bodies including the CDC and federal and state organizations. These policies and algorithms were followed during the patient's care in the ED.  Some ED evaluations and interventions may be delayed as a result of limited staffing during the pandemic.    Controlled Substance Database was reviewed by me. ____________________________________________   FINAL CLINICAL IMPRESSION(S) / ED DIAGNOSES   Final diagnoses:  Pneumonia due to COVID-19 virus      NEW MEDICATIONS STARTED DURING THIS VISIT:  ED Discharge Orders    None       Note:  This document was prepared using Dragon voice recognition software and may include unintentional dictation errors.    Don Perking, [place], MD 07/15/20 [phone]

## 2020-07-15 NOTE — ED Notes (Signed)
Into answer call bell, noted that patient's to right antecub missing, questioned patient who states she removed them both.

## 2020-07-15 NOTE — Discharge Instructions (Addendum)
To help boost your immune system against COVID-19, please take:  - Vitamin D3 4,000 IU/day - Vitamin C 500-1,000?mg twice a day - Quercetin 250?mg twice a day - Zinc 100?mg/day - Melatonin 10?mg before bedtime (causes drowsiness) - Aspirin 325?mg/day (unless contraindicated) - Pulse Oximeter Monitoring of oxygen saturation is recommended - check your oxygen 3 times a day if less than 90% return to the ER  These medications are all over-the-counter and do not need a prescription.   QUARANTINE INSTRUCTION  Follow these instructions at home:  Protecting others To avoid spreading the illness to other people: Quarantine in your home until you have had no cough and fever for 7 days. Household members should also be quarantine for at least 14 days after being exposed to you. Wash your hands often with soap and water. If soap and water are not available, use an alcohol-based hand sanitizer. If you have not cleaned your hands, do not touch your face. Make sure that all people in your household wash their hands well and often. Cover your nose and mouth when you cough or sneeze. Throw away used tissues. Stay home if you have any cold-like or flu-like symptoms. General instructions Go to your local pharmacy and buy a pulse oximeter (this is a machine that measures your oxygen). Check your oxygen levels at least 3 times a day. If your oxygen level is 92% or less return to the emergency room immediately Take over-the-counter and prescription medicines only as told by your health care provider. If you need medication for fever take tylenol or ibuprofen Drink enough fluid to keep your urine pale yellow. Rest at home as directed by your health care provider. Do not give aspirin to a child with the flu, because of the association with Reye's syndrome. Do not use tobacco products, including cigarettes, chewing tobacco, and e-cigarettes. If you need help quitting, ask your health care provider. Keep all  follow-up visits as told by your health care provider. This is important. How is this prevented? Avoid areas where an outbreak has been reported. Avoid large groups of people. Keep a safe distance from people who are coughing and sneezing. Do not touch your face if you have not cleaned your hands. When you are around people who are sick or might be sick, wear a mask to protect yourself. Contact a health care provider if: You have symptoms of SARS (cough, fever, chest pain, shortness of breath) that are not getting better at home. You have a fever. If you have difficulty breathing go to your local ER or call 911   

## 2020-07-16 ENCOUNTER — Emergency Department
Admission: EM | Admit: 2020-07-16 | Discharge: 2020-07-16 | Discharge status: Against Medical Advice | Payer: Medicaid Other

## 2020-08-08 ENCOUNTER — Emergency Department: Payer: Medicaid Other

## 2020-08-08 ENCOUNTER — Other Ambulatory Visit: Payer: Self-pay

## 2020-08-08 ENCOUNTER — Emergency Department
Admission: EM | Admit: 2020-08-08 | Discharge: 2020-08-09 | Disposition: A | Discharge status: Home / Self Care | Payer: Medicaid Other | Attending: Emergency Medicine | Admitting: Emergency Medicine

## 2020-08-08 DIAGNOSIS — Z5321 Procedure and treatment not carried out due to patient leaving prior to being seen by health care provider: Secondary | ICD-10-CM | POA: Diagnosis not present

## 2020-08-08 DIAGNOSIS — R002 Palpitations: Secondary | ICD-10-CM | POA: Insufficient documentation

## 2020-08-08 LAB — COMPREHENSIVE METABOLIC PANEL
ALT: 18 U/L (ref 0–44)
AST: 18 U/L (ref 15–41)
Albumin: 3.6 g/dL (ref 3.5–5.0)
Alkaline Phosphatase: 64 U/L (ref 38–126)
Anion gap: 10 (ref 5–15)
BUN: 16 mg/dL (ref 6–20)
CO2: 25 mmol/L (ref 22–32)
Calcium: 9.1 mg/dL (ref 8.9–10.3)
Chloride: 102 mmol/L (ref 98–111)
Creatinine, Ser: 0.78 mg/dL (ref 0.44–1.00)
GFR calc Af Amer: >60 mL/min (ref >60)
GFR calc non Af Amer: >60 mL/min (ref >60)
Glucose, Bld: 95 mg/dL (ref 70–99)
Potassium: 3.6 mmol/L (ref 3.5–5.1)
Sodium: 137 mmol/L (ref 135–145)
Total Bilirubin: 0.4 mg/dL (ref 0.3–1.2)
Total Protein: 7.6 g/dL (ref 6.5–8.1)

## 2020-08-08 LAB — TROPONIN I (HIGH SENSITIVITY)
Troponin I (High Sensitivity): 3 ng/L (ref <18)
Troponin I (High Sensitivity): 3 ng/L (ref <18)

## 2020-08-08 LAB — CBC
HCT: 34.6 % — ABNORMAL LOW (ref 36.0–46.0)
Hemoglobin: 10.9 g/dL — ABNORMAL LOW (ref 12.0–15.0)
MCH: 26.1 pg (ref 26.0–34.0)
MCHC: 31.5 g/dL (ref 30.0–36.0)
MCV: 82.8 fL (ref 80.0–100.0)
Platelets: 180 10*3/uL (ref 150–400)
RBC: 4.18 MIL/uL (ref 3.87–5.11)
RDW: 14.6 % (ref 11.5–15.5)
WBC: 6.3 10*3/uL (ref 4.0–10.5)
nRBC: 0 % (ref 0.0–0.2)

## 2020-08-08 NOTE — ED Triage Notes (Signed)
Pt with co palpitations that started today. Hx of the same and states was diagnosed with PVC's. No hx of heart disease, denies any recent cold symptoms.

## 2020-08-16 ENCOUNTER — Other Ambulatory Visit: Payer: Self-pay | Admitting: Family Medicine

## 2020-08-16 DIAGNOSIS — N649 Disorder of breast, unspecified: Secondary | ICD-10-CM

## 2020-08-30 ENCOUNTER — Ambulatory Visit
Admission: RE | Admit: 2020-08-30 | Discharge: 2020-08-30 | Disposition: A | Discharge status: Home / Self Care | Payer: Medicaid Other | Source: Ambulatory Visit | Attending: Family Medicine | Admitting: Family Medicine

## 2020-08-30 ENCOUNTER — Other Ambulatory Visit: Payer: Self-pay

## 2020-08-30 ENCOUNTER — Other Ambulatory Visit: Payer: Self-pay | Admitting: Family Medicine

## 2020-08-30 DIAGNOSIS — N6489 Other specified disorders of breast: Secondary | ICD-10-CM

## 2020-08-30 DIAGNOSIS — N649 Disorder of breast, unspecified: Secondary | ICD-10-CM

## 2020-08-30 DIAGNOSIS — Z1231 Encounter for screening mammogram for malignant neoplasm of breast: Secondary | ICD-10-CM

## 2020-10-04 ENCOUNTER — Emergency Department: Payer: Medicaid Other

## 2020-10-04 ENCOUNTER — Emergency Department
Admission: EM | Admit: 2020-10-04 | Discharge: 2020-10-04 | Disposition: A | Discharge status: Home / Self Care | Payer: Medicaid Other | Attending: Emergency Medicine | Admitting: Emergency Medicine

## 2020-10-04 ENCOUNTER — Encounter: Payer: Self-pay | Admitting: Emergency Medicine

## 2020-10-04 ENCOUNTER — Other Ambulatory Visit: Payer: Self-pay

## 2020-10-04 DIAGNOSIS — I10 Essential (primary) hypertension: Secondary | ICD-10-CM | POA: Insufficient documentation

## 2020-10-04 DIAGNOSIS — R519 Headache, unspecified: Secondary | ICD-10-CM | POA: Insufficient documentation

## 2020-10-04 DIAGNOSIS — H53149 Visual discomfort, unspecified: Secondary | ICD-10-CM | POA: Insufficient documentation

## 2020-10-04 DIAGNOSIS — Z79899 Other long term (current) drug therapy: Secondary | ICD-10-CM | POA: Diagnosis not present

## 2020-10-04 MED ORDER — SODIUM CHLORIDE 0.9 % IV BOLUS
1000.0000 mL | Freq: Once | INTRAVENOUS | Status: AC
  Administered 2020-10-04: 1000 mL via INTRAVENOUS

## 2020-10-04 MED ORDER — METOCLOPRAMIDE HCL 5 MG/ML IJ SOLN
10.0000 mg | Freq: Once | INTRAMUSCULAR | Status: AC
  Administered 2020-10-04: 10 mg via INTRAVENOUS
  Filled 2020-10-04: qty 2

## 2020-10-04 MED ORDER — DEXAMETHASONE SODIUM PHOSPHATE 10 MG/ML IJ SOLN
10.0000 mg | Freq: Once | INTRAMUSCULAR | Status: AC
  Administered 2020-10-04: 10 mg via INTRAVENOUS
  Filled 2020-10-04: qty 1

## 2020-10-04 MED ORDER — DIPHENHYDRAMINE HCL 50 MG/ML IJ SOLN
25.0000 mg | Freq: Once | INTRAMUSCULAR | Status: AC
  Administered 2020-10-04: 25 mg via INTRAVENOUS
  Filled 2020-10-04: qty 1

## 2020-10-04 NOTE — ED Triage Notes (Signed)
Pt to ED via POV with c/o "migraine for days". Pt states pain now 4/10, some relief after taking OTC Tylenol. Pt A&O x4, ambulatory without difficulty.

## 2020-10-04 NOTE — ED Notes (Signed)
See triage note. Pt ambulatory to room. Pt c/o of HA that started Monday without relief. Pt stating hx of migraines but this feels worse. Pt stating OTC medications not effective. Lights dimmed for pt comfort.

## 2020-10-04 NOTE — ED Notes (Signed)
Pt to CT. Pt in NAD at this time. VSS.  

## 2020-10-04 NOTE — ED Provider Notes (Signed)
Emergency Department Provider Note  ____________________________________________  Time seen: Approximately 5:57 PM  I have reviewed the triage vital signs and the nursing notes.   HISTORY  Chief Complaint Headache   Historian Patient    HPI Diamond Cook is a 33 y.o. female presents to the emergency department with headache that started on Monday.  Patient states that she has a history of headaches but this current headache feels atypical.  It came on suddenly.  Patient states that pain started around her right eye but has migrated to her left.  She has had some photophobia and phonophobia.  No nausea or vomiting.  No head traumas.  No chest pain, chest tightness or abdominal pain.   Past Medical History:  Diagnosis Date   Anemia    Anxiety    no meds   Depression    no meds   History of blood transfusion 2013   In [place]   Hypertension    Obesity    Vertigo      Immunizations up to date:  Yes.     Past Medical History:  Diagnosis Date   Anemia    Anxiety    no meds   Depression    no meds   History of blood transfusion 2013   In [place]   Hypertension    Obesity    Vertigo     Patient Active Problem List   Diagnosis Date Noted   Menorrhagia with irregular cycle 12/17/2017   Morbid obesity (HCC) 12/17/2017   Iron deficiency anemia 12/17/2017   Benign essential hypertension 12/17/2017    Past Surgical History:  Procedure Laterality Date   CESAREAN SECTION  2004,2005,2012   x 3 in [facility]   DILATION AND CURETTAGE OF UTERUS N/A 02/24/2018   Procedure: DILATATION AND CURETTAGE;  Surgeon: Reva Bores, MD;  Location: [facility];  Service: Gynecology;  Laterality: N/A;   INTRAUTERINE DEVICE (IUD) INSERTION N/A 02/24/2018   Procedure: INTRAUTERINE DEVICE (IUD) INSERTION;  Surgeon: Reva Bores, MD;  Location: [facility];  Service: Gynecology;  Laterality: N/A;   NOVASURE ABLATION     In [place]   TUBAL LIGATION     in  [facility]    Prior to Admission medications   Medication Sig Start Date End Date Taking? Authorizing Provider  albuterol (VENTOLIN HFA) 108 (90 Base) MCG/ACT inhaler Inhale 2 puffs into the lungs every 4 (four) hours as needed for wheezing or shortness of breath. 07/08/20   Cuthriell, Delorise Royals, PA-C  brompheniramine-pseudoephedrine-DM 30-2-10 MG/5ML syrup Take 10 mLs by mouth 4 (four) times daily as needed. 07/08/20   Cuthriell, Delorise Royals, PA-C  butalbital-acetaminophen-caffeine (FIORICET) [phone] MG tablet Take 1-2 tablets by mouth every 6 (six) hours as needed for headache. 07/08/20 07/08/21  Cuthriell, Delorise Royals, PA-C  ibuprofen (ADVIL) 800 MG tablet Take 1 tablet (800 mg total) by mouth every 8 (eight) hours as needed. Patient not taking: Reported on 05/17/2020 02/29/20   Menshew, Charlesetta Ivory, PA-C  metoprolol succinate (TOPROL-XL) 25 MG 24 hr tablet Take 25 mg by mouth daily.    [provider]  penicillin v potassium (VEETID) 500 MG tablet Take 1 tablet (500 mg total) by mouth 4 (four) times daily. Patient not taking: Reported on 05/17/2020 02/29/20   Menshew, Charlesetta Ivory, PA-C  predniSONE (DELTASONE) 10 MG tablet Take 1 tablet (10 mg total) by mouth daily. 07/08/20   Cuthriell, Delorise Royals, PA-C  ferrous sulfate 325 (65 FE) MG tablet Take  1 tablet (325 mg total) by mouth daily. Patient not taking: Reported on 10/23/2019 05/05/18 12/12/19  Mortis, Jerrel Ivory I, PA-C  hydrochlorothiazide (HYDRODIURIL) 25 MG tablet Take 1 tablet (25 mg total) by mouth daily. Patient not taking: Reported on 10/23/2019 05/18/19 12/12/19  Elson Areas, PA-C    Allergies Patient has no known allergies.  Family History  Problem Relation Age of Onset   Diabetes Maternal Grandmother    Hypertension Maternal Grandmother    Diabetes Maternal Aunt    Hypertension Maternal Aunt    Diabetes Maternal Aunt    Hypertension Maternal Aunt    Healthy Mother    Asthma Father     Social History Social  History   Tobacco Use   Smoking status: Never Smoker   Smokeless tobacco: Never Used  Building services engineer Use: Never used  Substance Use Topics   Alcohol use: No   Drug use: No     Review of Systems  Constitutional: No fever/chills Eyes:  No discharge ENT: No upper respiratory complaints. Respiratory: no cough. No SOB/ use of accessory muscles to breath Gastrointestinal:   No nausea, no vomiting.  No diarrhea.  No constipation. Musculoskeletal: Negative for musculoskeletal pain.  Headache: Patient has migraine. Skin: Negative for rash, abrasions, lacerations, ecchymosis.   ____________________________________________   PHYSICAL EXAM:  VITAL SIGNS: ED Triage Vitals [10/04/20 1607]  Enc Vitals Group     BP 140/78     Pulse Rate 79     Resp 19     Temp 98.7 F (37.1 C)     Temp Source Oral     SpO2 100 %     Weight (!) 326 lb 1 oz (147.9 kg)     Height 5\' 7"  (1.702 m)     Head Circumference      Peak Flow      Pain Score 4     Pain Loc      Pain Edu?      Excl. in GC?      Constitutional: Alert and oriented. Well appearing and in no acute distress. Eyes: Conjunctivae are normal. PERRL. EOMI. Head: Atraumatic. ENT:      Ears: TM are pearly.       Nose: No congestion/rhinnorhea.      Mouth/Throat: Mucous membranes are moist.  Neck: No stridor.  FROM.  Cardiovascular: Normal rate, regular rhythm. Normal S1 and S2.  Good peripheral circulation. Respiratory: Normal respiratory effort without tachypnea or retractions. Lungs CTAB. Good air entry to the bases with no decreased or absent breath sounds Gastrointestinal: Bowel sounds x 4 quadrants. Soft and nontender to palpation. No guarding or rigidity. No distention. Musculoskeletal: Full range of motion to all extremities. No obvious deformities noted Neurologic:  Normal for age. No gross focal neurologic deficits are appreciated.  Skin:  Skin is warm, dry and intact. No rash noted. Psychiatric: Mood and  affect are normal for age. Speech and behavior are normal.   ____________________________________________   LABS (all labs ordered are listed, but only abnormal results are displayed)  Labs Reviewed - No data to display ____________________________________________  EKG   ____________________________________________  RADIOLOGY , personally viewed and evaluated these images (plain radiographs) as part of my medical decision making, as well as reviewing the written report by the radiologist.  CT Head Wo Contrast  Result Date: 10/04/2020 CLINICAL DATA:  Persistent headache EXAM: CT HEAD WITHOUT CONTRAST TECHNIQUE: Contiguous axial images were obtained from the base of the  skull through the vertex without intravenous contrast. COMPARISON:  CT angiogram head Mar 21, 2017 FINDINGS: Brain: Ventricles and sulci are normal in size and configuration. There is no intracranial mass, hemorrhage, extra-axial fluid collection, or midline shift. Brain parenchyma appears unremarkable. No evident acute infarct. Vascular: No hyperdense vessel.  No evident vascular calcification. Skull: The bony calvarium appears intact. Sinuses/Orbits: Visualized paranasal sinuses are clear. Visualized orbits appear symmetric bilaterally. Other: Mastoid air cells are clear. IMPRESSION: Study within normal limits. Electronically Signed   By: Bretta Bang III M.D.   On: 10/04/2020 17:31    ____________________________________________    PROCEDURES  Procedure(s) performed:     Procedures     Medications  diphenhydrAMINE (BENADRYL) injection 25 mg (25 mg Intravenous Given 10/04/20 1656)  dexamethasone (DECADRON) injection 10 mg (10 mg Intravenous Given 10/04/20 1656)  metoCLOPramide (REGLAN) injection 10 mg (10 mg Intravenous Given 10/04/20 1657)  sodium chloride 0.9 % bolus 1,000 mL (1,000 mLs Intravenous New Bag/Given 10/04/20 1656)      ____________________________________________   INITIAL IMPRESSION / ASSESSMENT AND PLAN / ED COURSE  Pertinent labs & imaging results that were available during my care of the patient were reviewed by me and considered in my medical decision making (see chart for details).      Assessment and plan Headache 34 year old female presents to the emergency department with headache for the past 4 days.  Vital signs are reassuring at triage.  On physical exam, patient was alert, active and nontoxic-appearing.  CT head was obtained as patient has had atypical headache.  There was no evidence of intracranial bleed or other acute abnormality.  Patient was given Reglan, Benadryl, Decadron and fluids in the ED and she reported that her headache completely resolved.  She was advised to stay hydrated at home and to follow-up with primary care as needed.   ____________________________________________  FINAL CLINICAL IMPRESSION(S) / ED DIAGNOSES  Final diagnoses:  Acute nonintractable headache, unspecified headache type      NEW MEDICATIONS STARTED DURING THIS VISIT:  ED Discharge Orders    None          This chart was dictated using voice recognition software/Dragon. Despite best efforts to proofread, errors can occur which can change the meaning. Any change was purely unintentional.     Orvil Feil, PA-C 10/04/20 1803    Delton Prairie, MD 10/04/20 2312

## 2020-10-15 ENCOUNTER — Emergency Department: Payer: Medicaid Other

## 2020-10-15 ENCOUNTER — Other Ambulatory Visit: Payer: Self-pay

## 2020-10-15 DIAGNOSIS — R002 Palpitations: Secondary | ICD-10-CM | POA: Insufficient documentation

## 2020-10-15 DIAGNOSIS — M25519 Pain in unspecified shoulder: Secondary | ICD-10-CM | POA: Diagnosis not present

## 2020-10-15 DIAGNOSIS — Z5321 Procedure and treatment not carried out due to patient leaving prior to being seen by health care provider: Secondary | ICD-10-CM | POA: Insufficient documentation

## 2020-10-15 DIAGNOSIS — R42 Dizziness and giddiness: Secondary | ICD-10-CM | POA: Insufficient documentation

## 2020-10-15 LAB — CBC
HCT: 36.9 % (ref 36.0–46.0)
Hemoglobin: 11.6 g/dL — ABNORMAL LOW (ref 12.0–15.0)
MCH: 26 pg (ref 26.0–34.0)
MCHC: 31.4 g/dL (ref 30.0–36.0)
MCV: 82.7 fL (ref 80.0–100.0)
Platelets: 227 10*3/uL (ref 150–400)
RBC: 4.46 MIL/uL (ref 3.87–5.11)
RDW: 14.5 % (ref 11.5–15.5)
WBC: 6.7 10*3/uL (ref 4.0–10.5)
nRBC: 0 % (ref 0.0–0.2)

## 2020-10-15 LAB — TROPONIN I (HIGH SENSITIVITY)
Troponin I (High Sensitivity): 3 ng/L (ref <18)
Troponin I (High Sensitivity): 3 ng/L (ref <18)

## 2020-10-15 LAB — BASIC METABOLIC PANEL
Anion gap: 9 (ref 5–15)
BUN: 16 mg/dL (ref 6–20)
CO2: 27 mmol/L (ref 22–32)
Calcium: 9.1 mg/dL (ref 8.9–10.3)
Chloride: 105 mmol/L (ref 98–111)
Creatinine, Ser: 0.85 mg/dL (ref 0.44–1.00)
GFR, Estimated: >60 mL/min (ref >60)
Glucose, Bld: 97 mg/dL (ref 70–99)
Potassium: 4 mmol/L (ref 3.5–5.1)
Sodium: 141 mmol/L (ref 135–145)

## 2020-10-15 NOTE — ED Triage Notes (Signed)
PT to ED c/o palpitations. PT states she has had these everyday for years but in the last couple days has gotten more frequent. Has arm and shoulder pain as well as lightheadedness. PT color WDL. HR stable.Seen before and dx with PVC's

## 2020-10-16 ENCOUNTER — Emergency Department
Admission: EM | Admit: 2020-10-16 | Discharge: 2020-10-16 | Disposition: A | Discharge status: Home / Self Care | Payer: Medicaid Other | Attending: Emergency Medicine | Admitting: Emergency Medicine

## 2020-11-03 ENCOUNTER — Emergency Department
Admission: EM | Admit: 2020-11-03 | Discharge: 2020-11-03 | Disposition: A | Discharge status: Home / Self Care | Payer: Medicaid Other | Attending: Emergency Medicine | Admitting: Emergency Medicine

## 2020-11-03 ENCOUNTER — Encounter: Payer: Self-pay | Admitting: Emergency Medicine

## 2020-11-03 ENCOUNTER — Other Ambulatory Visit: Payer: Self-pay

## 2020-11-03 DIAGNOSIS — G8929 Other chronic pain: Secondary | ICD-10-CM | POA: Insufficient documentation

## 2020-11-03 DIAGNOSIS — R0602 Shortness of breath: Secondary | ICD-10-CM | POA: Diagnosis not present

## 2020-11-03 DIAGNOSIS — R109 Unspecified abdominal pain: Secondary | ICD-10-CM | POA: Diagnosis present

## 2020-11-03 DIAGNOSIS — R197 Diarrhea, unspecified: Secondary | ICD-10-CM | POA: Diagnosis not present

## 2020-11-03 DIAGNOSIS — S46812A Strain of other muscles, fascia and tendons at shoulder and upper arm level, left arm, initial encounter: Secondary | ICD-10-CM | POA: Insufficient documentation

## 2020-11-03 DIAGNOSIS — I10 Essential (primary) hypertension: Secondary | ICD-10-CM | POA: Diagnosis not present

## 2020-11-03 DIAGNOSIS — R1084 Generalized abdominal pain: Secondary | ICD-10-CM | POA: Diagnosis not present

## 2020-11-03 DIAGNOSIS — Z79899 Other long term (current) drug therapy: Secondary | ICD-10-CM | POA: Insufficient documentation

## 2020-11-03 DIAGNOSIS — X58XXXA Exposure to other specified factors, initial encounter: Secondary | ICD-10-CM | POA: Diagnosis not present

## 2020-11-03 LAB — URINALYSIS, COMPLETE (UACMP) WITH MICROSCOPIC
Bilirubin Urine: NEGATIVE
Glucose, UA: NEGATIVE mg/dL
Ketones, ur: NEGATIVE mg/dL
Nitrite: NEGATIVE
Protein, ur: NEGATIVE mg/dL
Specific Gravity, Urine: 1.021 (ref 1.005–1.030)
pH: 5 (ref 5.0–8.0)

## 2020-11-03 LAB — CBC
HCT: 36 % (ref 36.0–46.0)
Hemoglobin: 11.6 g/dL — ABNORMAL LOW (ref 12.0–15.0)
MCH: 26.2 pg (ref 26.0–34.0)
MCHC: 32.2 g/dL (ref 30.0–36.0)
MCV: 81.4 fL (ref 80.0–100.0)
Platelets: 196 10*3/uL (ref 150–400)
RBC: 4.42 MIL/uL (ref 3.87–5.11)
RDW: 14.1 % (ref 11.5–15.5)
WBC: 5.7 10*3/uL (ref 4.0–10.5)
nRBC: 0 % (ref 0.0–0.2)

## 2020-11-03 LAB — COMPREHENSIVE METABOLIC PANEL
ALT: 14 U/L (ref 0–44)
AST: 16 U/L (ref 15–41)
Albumin: 3.6 g/dL (ref 3.5–5.0)
Alkaline Phosphatase: 65 U/L (ref 38–126)
Anion gap: 8 (ref 5–15)
BUN: 15 mg/dL (ref 6–20)
CO2: 26 mmol/L (ref 22–32)
Calcium: 8.9 mg/dL (ref 8.9–10.3)
Chloride: 105 mmol/L (ref 98–111)
Creatinine, Ser: 0.8 mg/dL (ref 0.44–1.00)
GFR, Estimated: >60 mL/min (ref >60)
Glucose, Bld: 123 mg/dL — ABNORMAL HIGH (ref 70–99)
Potassium: 3.4 mmol/L — ABNORMAL LOW (ref 3.5–5.1)
Sodium: 139 mmol/L (ref 135–145)
Total Bilirubin: 0.5 mg/dL (ref 0.3–1.2)
Total Protein: 7.9 g/dL (ref 6.5–8.1)

## 2020-11-03 LAB — LIPASE, BLOOD: Lipase: 23 U/L (ref 11–51)

## 2020-11-03 LAB — POC URINE PREG, ED: Preg Test, Ur: NEGATIVE

## 2020-11-03 MED ORDER — DICYCLOMINE HCL 10 MG PO CAPS
10.0000 mg | ORAL_CAPSULE | Freq: Once | ORAL | Status: AC
  Administered 2020-11-03: 14:00:00 10 mg via ORAL
  Filled 2020-11-03: qty 1

## 2020-11-03 MED ORDER — ACETAMINOPHEN 500 MG PO TABS
1000.0000 mg | ORAL_TABLET | Freq: Once | ORAL | Status: AC
  Administered 2020-11-03: 14:00:00 1000 mg via ORAL
  Filled 2020-11-03: qty 2

## 2020-11-03 MED ORDER — IBUPROFEN 600 MG PO TABS
600.0000 mg | ORAL_TABLET | Freq: Once | ORAL | Status: AC
  Administered 2020-11-03: 14:00:00 600 mg via ORAL
  Filled 2020-11-03: qty 1

## 2020-11-03 MED ORDER — DICYCLOMINE HCL 10 MG PO CAPS: 10.0000 mg | ORAL_CAPSULE | Freq: Three times a day (TID) | ORAL | 0 refills | Status: DC

## 2020-11-03 MED ORDER — LIDOCAINE 5 % EX PTCH
1.0000 | MEDICATED_PATCH | CUTANEOUS | Status: DC
  Administered 2020-11-03: 14:00:00 1 via TRANSDERMAL
  Filled 2020-11-03: qty 1

## 2020-11-03 NOTE — ED Triage Notes (Signed)
Pt arrived via POV with multiple medical complaints.  Pt states the RLQ abd pain started during the middle of the night. Pt reports nausea and 1 episode of loose stool. Pt describes the pain as sharp.  Pt also c/o back pain that started 4 days ago and L shoulder pain x 1 week.

## 2020-11-03 NOTE — Discharge Instructions (Addendum)
Please take Tylenol and ibuprofen/Advil for your pain.  It is safe to take them together, or to alternate them every few hours.  Take up to 1000mg  of Tylenol at a time, up to 4 times per day.  Do not take more than 4000 mg of Tylenol in 24 hours.  For ibuprofen, take 400-600 mg, 4-5 times per day.  Reviewed discharge the prescription for Bentyl medication to use up to 3 times a day to help with your possible irritable bowel syndrome.  Please follow-up with your PCP to discuss the possibility of irritable bowel syndrome.  Return to the ED with any significantly worsening abdominal pain, particularly with fevers or throwing up.

## 2020-11-03 NOTE — ED Provider Notes (Signed)
[facility] Emergency Department Provider Note ____________________________________________   Event Date/Time   First MD Initiated Contact with Patient 11/03/20 1158     (approximate)  I have reviewed the triage vital signs and the nursing notes.  HISTORY  Chief Complaint Abdominal Pain   HPI Diamond Cook is a 34 y.o. femalewho presents to the ED for evaluation of abdominal cramping and pain.  Chart review indicates history of anxiety and obesity. Patient self-reports a history of possible IBS due to years of intermittent abdominal cramping and diarrhea.  She is never been diagnosed with this and takes no medications for this.  Patient reports waking up early this morning to use the restroom, and noticed generalized cramping abdominal pain consistent with her chronic pain.  She reports the pain that was of similar quality, but more severe such that she felt short of breath "for a few minutes."  Due to the shortness of breath alongside her chronic pain, she presents to the ED for evaluation.  Patient reports this has resolved by the time I see her, and reports it has not recurred.  She reports she only felt short of breath in the setting of her pain, but it has now subsided.  She reports that she feels normal now and has no complaints.  Denies recent emesis, fevers, dysuria, chest pain, cough or syncope.  She reports having an episode of watery diarrhea this morning and feeling better afterwards.  Patient is further reporting about 1 week of intermittent left shoulder and neck pain, transiently resolved with ibuprofen, "but always comes back."   Past Medical History:  Diagnosis Date  . Anemia   . Anxiety    no meds  . Depression    no meds  . History of blood transfusion 2013   In [place]  . Hypertension   . Obesity   . Vertigo     Patient Active Problem List   Diagnosis Date Noted  . Menorrhagia with irregular cycle 12/17/2017  . Morbid  obesity (HCC) 12/17/2017  . Iron deficiency anemia 12/17/2017  . Benign essential hypertension 12/17/2017    Past Surgical History:  Procedure Laterality Date  . CESAREAN SECTION  2004,2005,2012   x 3 in [place]  . DILATION AND CURETTAGE OF UTERUS N/A 02/24/2018   Procedure: DILATATION AND CURETTAGE;  Surgeon: Reva Bores, MD;  Location: [facility];  Service: Gynecology;  Laterality: N/A;  . INTRAUTERINE DEVICE (IUD) INSERTION N/A 02/24/2018   Procedure: INTRAUTERINE DEVICE (IUD) INSERTION;  Surgeon: Reva Bores, MD;  Location: [facility];  Service: Gynecology;  Laterality: N/A;  . NOVASURE ABLATION     In [place]  . TUBAL LIGATION     in [place]    Prior to Admission medications   Medication Sig Start Date End Date Taking? Authorizing Provider  albuterol (VENTOLIN HFA) 108 (90 Base) MCG/ACT inhaler Inhale 2 puffs into the lungs every 4 (four) hours as needed for wheezing or shortness of breath. 07/08/20   Cuthriell, Delorise Royals, PA-C  brompheniramine-pseudoephedrine-DM 30-2-10 MG/5ML syrup Take 10 mLs by mouth 4 (four) times daily as needed. 07/08/20   Cuthriell, Delorise Royals, PA-C  butalbital-acetaminophen-caffeine (FIORICET) [phone] MG tablet Take 1-2 tablets by mouth every 6 (six) hours as needed for headache. 07/08/20 07/08/21  Cuthriell, Delorise Royals, PA-C  dicyclomine (BENTYL) 10 MG capsule Take 1 capsule (10 mg total) by mouth 3 (three) times daily before meals for 14 days. 11/03/20 11/17/20  Delton Prairie, MD  ibuprofen (  ADVIL) 800 MG tablet Take 1 tablet (800 mg total) by mouth every 8 (eight) hours as needed. Patient not taking: Reported on 05/17/2020 02/29/20   Menshew, Charlesetta Ivory, PA-C  metoprolol succinate (TOPROL-XL) 25 MG 24 hr tablet Take 25 mg by mouth daily.    [provider]  penicillin v potassium (VEETID) 500 MG tablet Take 1 tablet (500 mg total) by mouth 4 (four) times daily. Patient not taking: Reported on 05/17/2020 02/29/20   Menshew, Charlesetta Ivory, PA-C  predniSONE  (DELTASONE) 10 MG tablet Take 1 tablet (10 mg total) by mouth daily. 07/08/20   Cuthriell, Delorise Royals, PA-C  ferrous sulfate 325 (65 FE) MG tablet Take 1 tablet (325 mg total) by mouth daily. Patient not taking: Reported on 10/23/2019 05/05/18 12/12/19  Mortis, Jerrel Ivory I, PA-C  hydrochlorothiazide (HYDRODIURIL) 25 MG tablet Take 1 tablet (25 mg total) by mouth daily. Patient not taking: Reported on 10/23/2019 05/18/19 12/12/19  Elson Areas, PA-C    Allergies Patient has no known allergies.  Family History  Problem Relation Age of Onset  . Diabetes Maternal Grandmother   . Hypertension Maternal Grandmother   . Diabetes Maternal Aunt   . Hypertension Maternal Aunt   . Diabetes Maternal Aunt   . Hypertension Maternal Aunt   . Healthy Mother   . Asthma Father     Social History Social History   Tobacco Use  . Smoking status: Never Smoker  . Smokeless tobacco: Never Used  Vaping Use  . Vaping Use: Never used  Substance Use Topics  . Alcohol use: No  . Drug use: No    Review of Systems  Constitutional: No fever/chills Eyes: No visual changes. ENT: No sore throat. Cardiovascular: Denies chest pain. Respiratory: Positive for shortness of breath Gastrointestinal:  No nausea, no vomiting. No constipation Positive for abdominal pain diarrhea. Genitourinary: Negative for dysuria. Musculoskeletal: Negative for back pain. Skin: Negative for rash. Neurological: Negative for headaches, focal weakness or numbness.  ____________________________________________   PHYSICAL EXAM:  VITAL SIGNS: Vitals:   11/03/20 1229 11/03/20 1427  BP: 116/75 114/69  Pulse: 74 79  Resp: 18 18  Temp:    SpO2: 98% 99%      Constitutional: Alert and oriented. Well appearing and in no acute distress.  Obese. Eyes: Conjunctivae are normal. PERRL. EOMI. Head: Atraumatic. Nose: No congestion/rhinnorhea. Mouth/Throat: Mucous membranes are moist.  Oropharynx non-erythematous. Neck: No stridor.  No cervical spine tenderness to palpation. Cardiovascular: Normal rate, regular rhythm. Grossly normal heart sounds.  Good peripheral circulation. Respiratory: Normal respiratory effort.  No retractions. Lungs CTAB. Gastrointestinal: Soft , nondistended, nontender to palpation. No CVA tenderness.  Benign throughout Musculoskeletal: No lower extremity tenderness nor edema.  No joint effusions. No signs of acute trauma. Tenderness to palpation over her left trapezius muscle that reproduces her symptoms. Neurologic:  Normal speech and language. No gross focal neurologic deficits are appreciated. No gait instability noted. Skin:  Skin is warm, dry and intact. No rash noted. Psychiatric: Mood and affect are normal. Speech and behavior are normal.  ____________________________________________   LABS (all labs ordered are listed, but only abnormal results are displayed)  Labs Reviewed  COMPREHENSIVE METABOLIC PANEL - Abnormal; Notable for the following components:      Result Value   Potassium 3.4 (*)    Glucose, Bld 123 (*)    All other components within normal limits  CBC - Abnormal; Notable for the following components:   Hemoglobin 11.6 (*)  All other components within normal limits  URINALYSIS, COMPLETE (UACMP) WITH MICROSCOPIC - Abnormal; Notable for the following components:   Color, Urine YELLOW (*)    APPearance HAZY (*)    Hgb urine dipstick MODERATE (*)    Leukocytes,Ua TRACE (*)    Bacteria, UA RARE (*)    All other components within normal limits  LIPASE, BLOOD  POC URINE PREG, ED   ____________________________________________   PROCEDURES and INTERVENTIONS  Procedure(s) performed (including Critical Care):  Procedures  Medications  lidocaine (LIDODERM) 5 % 1 patch (1 patch Transdermal Patch Applied 11/03/20 1422)  dicyclomine (BENTYL) capsule 10 mg (10 mg Oral Given 11/03/20 1422)  acetaminophen (TYLENOL) tablet 1,000 mg (1,000 mg Oral Given 11/03/20 1422)   ibuprofen (ADVIL) tablet 600 mg (600 mg Oral Given 11/03/20 1422)    ____________________________________________   MDM / ED COURSE   Obese 34 year old woman presents to the ED after an episode of self resolving shortness of breath in the setting of her chronic abdominal pain, minimal to outpatient management.  Normal vitals on room air.  Exam reassuring without evidence of any intra-abdominal pathology or distress.  She is some tenderness of her left trapezius muscle consistent with muscular spasm, for which she received Tylenol, NSAIDs and a lidocaine patch.  She is a benign abdomen and is tolerating p.o. intake here in the ED, and I see no indication for advanced imaging of her abdomen.  Empirically provided Bentyl and advised the patient follow-up with her PCP.  We discussed return precautions for the ED.  Patient medically stable for discharge home.    ____________________________________________   FINAL CLINICAL IMPRESSION(S) / ED DIAGNOSES  Final diagnoses:  Generalized abdominal pain  Trapezius muscle strain, left, initial encounter     ED Discharge Orders         Ordered    dicyclomine (BENTYL) 10 MG capsule  3 times daily before meals        11/03/20 1515           Rande Dario   Note:  This document was prepared using Conservation officer, historic buildings and may include unintentional dictation errors.   Delton Prairie, MD 11/03/20 [id number]

## 2020-11-06 ENCOUNTER — Other Ambulatory Visit: Payer: Self-pay

## 2020-11-06 DIAGNOSIS — R109 Unspecified abdominal pain: Secondary | ICD-10-CM | POA: Insufficient documentation

## 2020-11-06 DIAGNOSIS — Z79899 Other long term (current) drug therapy: Secondary | ICD-10-CM | POA: Insufficient documentation

## 2020-11-06 DIAGNOSIS — I10 Essential (primary) hypertension: Secondary | ICD-10-CM | POA: Insufficient documentation

## 2020-11-06 LAB — CBC
HCT: 35.6 % — ABNORMAL LOW (ref 36.0–46.0)
Hemoglobin: 11.2 g/dL — ABNORMAL LOW (ref 12.0–15.0)
MCH: 25.8 pg — ABNORMAL LOW (ref 26.0–34.0)
MCHC: 31.5 g/dL (ref 30.0–36.0)
MCV: 82 fL (ref 80.0–100.0)
Platelets: 203 10*3/uL (ref 150–400)
RBC: 4.34 MIL/uL (ref 3.87–5.11)
RDW: 14 % (ref 11.5–15.5)
WBC: 6.9 10*3/uL (ref 4.0–10.5)
nRBC: 0 % (ref 0.0–0.2)

## 2020-11-06 LAB — POC URINE PREG, ED: Preg Test, Ur: NEGATIVE

## 2020-11-06 LAB — URINALYSIS, COMPLETE (UACMP) WITH MICROSCOPIC
Bilirubin Urine: NEGATIVE
Glucose, UA: NEGATIVE mg/dL
Ketones, ur: NEGATIVE mg/dL
Nitrite: NEGATIVE
Protein, ur: NEGATIVE mg/dL
Specific Gravity, Urine: 1.02 (ref 1.005–1.030)
pH: 6 (ref 5.0–8.0)

## 2020-11-06 LAB — COMPREHENSIVE METABOLIC PANEL
ALT: 15 U/L (ref 0–44)
AST: 17 U/L (ref 15–41)
Albumin: 3.6 g/dL (ref 3.5–5.0)
Alkaline Phosphatase: 63 U/L (ref 38–126)
Anion gap: 11 (ref 5–15)
BUN: 12 mg/dL (ref 6–20)
CO2: 26 mmol/L (ref 22–32)
Calcium: 9.1 mg/dL (ref 8.9–10.3)
Chloride: 103 mmol/L (ref 98–111)
Creatinine, Ser: 0.75 mg/dL (ref 0.44–1.00)
GFR, Estimated: >60 mL/min (ref >60)
Glucose, Bld: 114 mg/dL — ABNORMAL HIGH (ref 70–99)
Potassium: 4 mmol/L (ref 3.5–5.1)
Sodium: 140 mmol/L (ref 135–145)
Total Bilirubin: 0.5 mg/dL (ref 0.3–1.2)
Total Protein: 7.9 g/dL (ref 6.5–8.1)

## 2020-11-06 LAB — LIPASE, BLOOD: Lipase: 27 U/L (ref 11–51)

## 2020-11-06 NOTE — ED Triage Notes (Signed)
Pt to ED reporting she is concerned for "liver problems." Pt has no hx of liver problems and when asked why she thought this pt reports she has been having a garlic like odor from her armpits, urine and stool. Pt saw on the Internet that this is a sign of liver problems. Pt also reporting she has been concerned that he eyes were changing to a blue color instead of white. Pts eyes appear WNL in triage.   Pt also reports over three weeks of intermittent abd pain that she states she believes to be IBS. Stool has been brown, orange and yellow. PT reports when she has abd pain it usually subsides after a BM.

## 2020-11-07 ENCOUNTER — Emergency Department
Admission: EM | Admit: 2020-11-07 | Discharge: 2020-11-07 | Disposition: A | Discharge status: Home / Self Care | Payer: Medicaid Other | Attending: Emergency Medicine | Admitting: Emergency Medicine

## 2020-11-07 DIAGNOSIS — R109 Unspecified abdominal pain: Secondary | ICD-10-CM

## 2020-11-07 DIAGNOSIS — R6889 Other general symptoms and signs: Secondary | ICD-10-CM

## 2020-11-07 NOTE — ED Provider Notes (Signed)
[facility] Emergency Department Provider Note  ____________________________________________   Event Date/Time   First MD Initiated Contact with Patient 11/07/20 [phone]     (approximate)  I have reviewed the triage vital signs and the nursing notes.   HISTORY  Chief Complaint Abdominal Pain   HPI Diamond Cook is a 34 y.o. female with a past history of obesity, anemia, depression, anxiety, and HTN who presents for assessment of some intermittent right sided abdominal pain that has been going on for several weeks is typically after meals but is relieved after she has a bowel movement.  Patient states she currently does not have any pain and think she may have IBS she has diagnosed herself with this.  She denies any change in the pain the last day or so but states this is just been going on for a long time she wanted to get her liver checked out.  She states she read online that if she smells like garlic there may be something wrong with her liver and she think she has been swelling more like garlic over the last couple of days.  She is also concerned that the color of her eyes is changing and she thinks they are more blue than usual.  She denies any headache, earache, sore throat, nausea, vomiting, chest pain, cough, shortness of breath, back pain, urinary symptoms, blood in her stool, blood in her urine, constipation, or recent diarrhea.  Denies any rash or extremity pain.         Past Medical History:  Diagnosis Date  . Anemia   . Anxiety    no meds  . Depression    no meds  . History of blood transfusion 2013   In [place]  . Hypertension   . Obesity   . Vertigo     Patient Active Problem List   Diagnosis Date Noted  . Menorrhagia with irregular cycle 12/17/2017  . Morbid obesity (HCC) 12/17/2017  . Iron deficiency anemia 12/17/2017  . Benign essential hypertension 12/17/2017    Past Surgical History:  Procedure Laterality Date  . CESAREAN  SECTION  2004,2005,2012   x 3 in [place]  . DILATION AND CURETTAGE OF UTERUS N/A 02/24/2018   Procedure: DILATATION AND CURETTAGE;  Surgeon: Reva Bores, MD;  Location: [facility];  Service: Gynecology;  Laterality: N/A;  . INTRAUTERINE DEVICE (IUD) INSERTION N/A 02/24/2018   Procedure: INTRAUTERINE DEVICE (IUD) INSERTION;  Surgeon: Reva Bores, MD;  Location: [facility];  Service: Gynecology;  Laterality: N/A;  . NOVASURE ABLATION     In [place]  . TUBAL LIGATION     in [place]    Prior to Admission medications   Medication Sig Start Date End Date Taking? Authorizing Provider  albuterol (VENTOLIN HFA) 108 (90 Base) MCG/ACT inhaler Inhale 2 puffs into the lungs every 4 (four) hours as needed for wheezing or shortness of breath. 07/08/20   Cuthriell, Delorise Royals, PA-C  brompheniramine-pseudoephedrine-DM 30-2-10 MG/5ML syrup Take 10 mLs by mouth 4 (four) times daily as needed. 07/08/20   Cuthriell, Delorise Royals, PA-C  butalbital-acetaminophen-caffeine (FIORICET) [phone] MG tablet Take 1-2 tablets by mouth every 6 (six) hours as needed for headache. 07/08/20 07/08/21  Cuthriell, Delorise Royals, PA-C  dicyclomine (BENTYL) 10 MG capsule Take 1 capsule (10 mg total) by mouth 3 (three) times daily before meals for 14 days. 11/03/20 11/17/20  Delton Prairie, MD  ibuprofen (ADVIL) 800 MG tablet Take 1 tablet (800 mg total) by  mouth every 8 (eight) hours as needed. Patient not taking: Reported on 05/17/2020 02/29/20   Menshew, Charlesetta Ivory, PA-C  metoprolol succinate (TOPROL-XL) 25 MG 24 hr tablet Take 25 mg by mouth daily.    [provider]  penicillin v potassium (VEETID) 500 MG tablet Take 1 tablet (500 mg total) by mouth 4 (four) times daily. Patient not taking: Reported on 05/17/2020 02/29/20   Menshew, Charlesetta Ivory, PA-C  predniSONE (DELTASONE) 10 MG tablet Take 1 tablet (10 mg total) by mouth daily. 07/08/20   Cuthriell, Delorise Royals, PA-C  ferrous sulfate 325 (65 FE) MG tablet Take 1 tablet (325 mg total) by  mouth daily. Patient not taking: Reported on 10/23/2019 05/05/18 12/12/19  Mortis, Jerrel Ivory I, PA-C  hydrochlorothiazide (HYDRODIURIL) 25 MG tablet Take 1 tablet (25 mg total) by mouth daily. Patient not taking: Reported on 10/23/2019 05/18/19 12/12/19  Elson Areas, PA-C    Allergies Patient has no known allergies.  Family History  Problem Relation Age of Onset  . Diabetes Maternal Grandmother   . Hypertension Maternal Grandmother   . Diabetes Maternal Aunt   . Hypertension Maternal Aunt   . Diabetes Maternal Aunt   . Hypertension Maternal Aunt   . Healthy Mother   . Asthma Father     Social History Social History   Tobacco Use  . Smoking status: Never Smoker  . Smokeless tobacco: Never Used  Vaping Use  . Vaping Use: Never used  Substance Use Topics  . Alcohol use: No  . Drug use: No    Review of Systems  Review of Systems  Constitutional: Negative for chills and fever.  HENT: Negative for sore throat.   Eyes: Negative for pain.  Respiratory: Negative for cough and stridor.   Cardiovascular: Negative for chest pain.  Gastrointestinal: Positive for abdominal pain. Negative for vomiting.  Genitourinary: Negative for dysuria.  Musculoskeletal: Negative for myalgias.  Skin: Negative for rash.  Neurological: Negative for seizures, loss of consciousness and headaches.  Psychiatric/Behavioral: Negative for suicidal ideas.  All other systems reviewed and are negative.     ____________________________________________   PHYSICAL EXAM:  VITAL SIGNS: ED Triage Vitals  Enc Vitals Group     BP 11/06/20 2006 (!) 144/83     Pulse Rate 11/06/20 2006 89     Resp 11/06/20 2006 18     Temp 11/06/20 2006 99.7 F (37.6 C)     Temp Source 11/06/20 2006 Oral     SpO2 11/06/20 2006 100 %     Weight --      Height --      Head Circumference --      Peak Flow --      Pain Score 11/06/20 2010 0     Pain Loc --      Pain Edu? --      Excl. in GC? --    Vitals:    11/06/20 2322 11/07/20 0228  BP: 118/86 (!) 133/92  Pulse: 63 62  Resp: 14 16  Temp: 98.1 F (36.7 C) 98.3 F (36.8 C)  SpO2: 99% 99%   Physical Exam Vitals and nursing note reviewed.  Constitutional:      General: She is not in acute distress.    Appearance: She is well-developed and well-nourished. She is obese.  HENT:     Head: Normocephalic and atraumatic.     Right Ear: External ear normal.     Left Ear: External ear normal.  Nose: Nose normal.     Mouth/Throat:     Mouth: Mucous membranes are moist.  Eyes:     General: No scleral icterus.    Conjunctiva/sclera: Conjunctivae normal.  Cardiovascular:     Rate and Rhythm: Normal rate and regular rhythm.     Heart sounds: No murmur heard.   Pulmonary:     Effort: Pulmonary effort is normal. No respiratory distress.     Breath sounds: Normal breath sounds.  Abdominal:     Palpations: Abdomen is soft.     Tenderness: There is no abdominal tenderness. There is no right CVA tenderness or left CVA tenderness.  Musculoskeletal:        General: No edema.     Cervical back: Neck supple.  Skin:    General: Skin is warm and dry.  Neurological:     Mental Status: She is alert.  Psychiatric:        Mood and Affect: Mood and affect normal.      ____________________________________________   LABS (all labs ordered are listed, but only abnormal results are displayed)  Labs Reviewed  COMPREHENSIVE METABOLIC PANEL - Abnormal; Notable for the following components:      Result Value   Glucose, Bld 114 (*)    All other components within normal limits  CBC - Abnormal; Notable for the following components:   Hemoglobin 11.2 (*)    HCT 35.6 (*)    MCH 25.8 (*)    All other components within normal limits  URINALYSIS, COMPLETE (UACMP) WITH MICROSCOPIC - Abnormal; Notable for the following components:   Color, Urine YELLOW (*)    APPearance HAZY (*)    Hgb urine dipstick SMALL (*)    Leukocytes,Ua TRACE (*)    Bacteria,  UA FEW (*)    All other components within normal limits  LIPASE, BLOOD  POC URINE PREG, ED   ____________________________________________  ____________________________________________   PROCEDURES  Procedure(s) performed (including Critical Care):  Procedures   ____________________________________________   INITIAL IMPRESSION / ASSESSMENT AND PLAN / ED COURSE        Patient presents with above history exam for assessment of some intermittent right-sided abdominal discomfort that is relieved with defecation has been going on for at least several weeks as well as concerns about a garlic odor to her armpits and breath as well as changing on the color of her eyes.  She is afebrile hemodynamically stable on arrival.  Patient sclera are white and normal and not icteric or otherwise discolored on my exam.  Patient's abdomen is soft and nontender and she denies any chest pain today but she states she went to get checked out.  She has no tenderness or findings on her CMP or lipase which are both within normal limits to suggest acute appendicitis, cholecystitis, hepatitis or other immediate life-threatening pathology.  No CVA tenderness or urinary symptoms to suggest stone, kidney infection, cystitis or other acute GU infection.  Given duration of symptoms with patient denying any pain at this time and stable vitals with reassuring labs have very low suspicion for appendicitis, diverticulitis, SBO or other emergent intra-abdominal pathology.  Unclear etiology for patient's perceived change in her body odor or perceived change in eye discoloration although given stable vitals with otherwise reassuring exam and work-up with patient denying any acute symptoms at this time I believe she is safe for discharge with plan for continued outpatient follow-up.  Discharged stable condition.  Strict return precautions advised and discussed.   ____________________________________________  FINAL CLINICAL  IMPRESSION(S) / ED DIAGNOSES  Final diagnoses:  Multiple complaints  Abdominal pain, unspecified abdominal location    Medications - No data to display   ED Discharge Orders    None       Note:  This document was prepared using Dragon voice recognition software and may include unintentional dictation errors.   Gilles ChiquitoSmith, Tyree Vandruff P, MD 11/07/20 [phone]

## 2020-11-07 NOTE — ED Notes (Signed)
MD at bedside. 

## 2020-11-27 ENCOUNTER — Emergency Department
Admission: EM | Admit: 2020-11-27 | Discharge: 2020-11-27 | Discharge status: Against Medical Advice | Payer: Medicaid Other | Attending: Student | Admitting: Student

## 2020-11-27 ENCOUNTER — Other Ambulatory Visit: Payer: Self-pay

## 2020-11-27 ENCOUNTER — Encounter: Payer: Self-pay | Admitting: Emergency Medicine

## 2020-11-27 DIAGNOSIS — G43909 Migraine, unspecified, not intractable, without status migrainosus: Secondary | ICD-10-CM | POA: Insufficient documentation

## 2020-11-27 DIAGNOSIS — Z20822 Contact with and (suspected) exposure to covid-19: Secondary | ICD-10-CM | POA: Diagnosis not present

## 2020-11-27 DIAGNOSIS — I10 Essential (primary) hypertension: Secondary | ICD-10-CM | POA: Diagnosis not present

## 2020-11-27 DIAGNOSIS — R519 Headache, unspecified: Secondary | ICD-10-CM | POA: Diagnosis present

## 2020-11-27 DIAGNOSIS — G43009 Migraine without aura, not intractable, without status migrainosus: Secondary | ICD-10-CM

## 2020-11-27 LAB — URINALYSIS, COMPLETE (UACMP) WITH MICROSCOPIC
Bacteria, UA: NONE SEEN
Bilirubin Urine: NEGATIVE
Glucose, UA: NEGATIVE mg/dL
Hgb urine dipstick: NEGATIVE
Ketones, ur: NEGATIVE mg/dL
Nitrite: NEGATIVE
Protein, ur: NEGATIVE mg/dL
Specific Gravity, Urine: 1.008 (ref 1.005–1.030)
pH: 7 (ref 5.0–8.0)

## 2020-11-27 LAB — POC URINE PREG, ED: Preg Test, Ur: NEGATIVE

## 2020-11-27 LAB — POC SARS CORONAVIRUS 2 AG -  ED: SARS Coronavirus 2 Ag: NEGATIVE

## 2020-11-27 MED ORDER — METOCLOPRAMIDE HCL 5 MG/ML IJ SOLN
10.0000 mg | Freq: Once | INTRAMUSCULAR | Status: AC
  Administered 2020-11-27: 10 mg via INTRAVENOUS
  Filled 2020-11-27: qty 2

## 2020-11-27 MED ORDER — DIPHENHYDRAMINE HCL 50 MG/ML IJ SOLN
50.0000 mg | Freq: Once | INTRAMUSCULAR | Status: DC
  Filled 2020-11-27: qty 1

## 2020-11-27 MED ORDER — SODIUM CHLORIDE 0.9 % IV BOLUS
1000.0000 mL | Freq: Once | INTRAVENOUS | Status: AC
  Administered 2020-11-27: 1000 mL via INTRAVENOUS

## 2020-11-27 MED ORDER — DEXAMETHASONE SODIUM PHOSPHATE 10 MG/ML IJ SOLN
10.0000 mg | Freq: Once | INTRAMUSCULAR | Status: AC
  Administered 2020-11-27: 10 mg via INTRAVENOUS
  Filled 2020-11-27: qty 1

## 2020-11-27 NOTE — ED Provider Notes (Signed)
[facility] Emergency Department Provider Note  ____________________________________________   None    (approximate)  I have reviewed the triage vital signs and the nursing notes.   HISTORY  Chief Complaint Nausea and Headache  HPI Diamond Cook is a 35 y.o. female who reports to the emergency department for evaluation of headache that has been present for the last 1 to 2 weeks.  The patient states that this started as a sharp pain that ran throughout her head, but then began pulsating over the left temporal region.  She states that it has not progressed and involves the right temporal region.  Her headache is rated an 8/10 when the sharp pain is there, though reports this can be intermittent.  She also reports intermittent blurry vision though she is able to blink to refocus her vision.  She reports that she had an episode of eye pain when looking superior left, but that this has since gone away.  She started with nausea that began today as well.  She denies any fevers, no known COVID exposures, she is not vaccinated for COVID-19.  She reports no history of neurologic issues, no history of migraines.  No history of MS in the family.  She reports that her appetite has been okay, however she does report that she started a new diet 7 days ago, the [employer] fast.  Overall, she has medical history she reports of high blood pressure, anxiety.  Otherwise reports she is healthy.  Of note, she states "I am beginning to think that I am having an aneurysm or something".  In addition, she reports that she feels that her eyes are "more yellow than normal", and that, I am beginning to think maybe something is wrong with my stomach because I have had changes to my stool".  She denies any vomiting, diarrhea, constipation.   Note, upon finishing history taking, the patient vaguely mentions that she was seen at [place] today for evaluation of same complaint.  She states  that they drew blood work but did not do anything else.  She is limited in the details that she provides about this hospital visit.         Past Medical History:  Diagnosis Date  . Anemia   . Anxiety    no meds  . Depression    no meds  . History of blood transfusion 2013   In [place]  . Hypertension   . Obesity   . Vertigo     Patient Active Problem List   Diagnosis Date Noted  . Menorrhagia with irregular cycle 12/17/2017  . Morbid obesity (HCC) 12/17/2017  . Iron deficiency anemia 12/17/2017  . Benign essential hypertension 12/17/2017    Past Surgical History:  Procedure Laterality Date  . CESAREAN SECTION  2004,2005,2012   x 3 in [place]  . DILATION AND CURETTAGE OF UTERUS N/A 02/24/2018   Procedure: DILATATION AND CURETTAGE;  Surgeon: Reva BoresPratt, Tanya S, MD;  Location: [facility];  Service: Gynecology;  Laterality: N/A;  . INTRAUTERINE DEVICE (IUD) INSERTION N/A 02/24/2018   Procedure: INTRAUTERINE DEVICE (IUD) INSERTION;  Surgeon: Reva BoresPratt, Tanya S, MD;  Location: [facility];  Service: Gynecology;  Laterality: N/A;  . NOVASURE ABLATION     In [place]   . TUBAL LIGATION     in [place]    Prior to Admission medications   Medication Sig Start Date End Date Taking? Authorizing Provider  albuterol (VENTOLIN HFA) 108 (90 Base)  MCG/ACT inhaler Inhale 2 puffs into the lungs every 4 (four) hours as needed for wheezing or shortness of breath. 07/08/20   Cuthriell, Delorise Royals, PA-C  brompheniramine-pseudoephedrine-DM 30-2-10 MG/5ML syrup Take 10 mLs by mouth 4 (four) times daily as needed. 07/08/20   Cuthriell, Delorise Royals, PA-C  butalbital-acetaminophen-caffeine (FIORICET) [id number] MG tablet Take 1-2 tablets by mouth every 6 (six) hours as needed for headache. 07/08/20 07/08/21  Cuthriell, Delorise Royals, PA-C  dicyclomine (BENTYL) 10 MG capsule Take 1 capsule (10 mg total) by mouth 3 (three) times daily before meals for 14 days. 11/03/20 11/17/20  Delton Prairie, MD  ibuprofen (ADVIL) 800 MG  tablet Take 1 tablet (800 mg total) by mouth every 8 (eight) hours as needed. Patient not taking: Reported on 05/17/2020 02/29/20   Menshew, Charlesetta Ivory, PA-C  metoprolol succinate (TOPROL-XL) 25 MG 24 hr tablet Take 25 mg by mouth daily.    [provider]  penicillin v potassium (VEETID) 500 MG tablet Take 1 tablet (500 mg total) by mouth 4 (four) times daily. Patient not taking: Reported on 05/17/2020 02/29/20   Menshew, Charlesetta Ivory, PA-C  predniSONE (DELTASONE) 10 MG tablet Take 1 tablet (10 mg total) by mouth daily. 07/08/20   Cuthriell, Delorise Royals, PA-C  ferrous sulfate 325 (65 FE) MG tablet Take 1 tablet (325 mg total) by mouth daily. Patient not taking: Reported on 10/23/2019 05/05/18 12/12/19  Mortis, Jerrel Ivory I, PA-C  hydrochlorothiazide (HYDRODIURIL) 25 MG tablet Take 1 tablet (25 mg total) by mouth daily. Patient not taking: Reported on 10/23/2019 05/18/19 12/12/19  Elson Areas, PA-C    Allergies Patient has no known allergies.  Family History  Problem Relation Age of Onset  . Diabetes Maternal Grandmother   . Hypertension Maternal Grandmother   . Diabetes Maternal Aunt   . Hypertension Maternal Aunt   . Diabetes Maternal Aunt   . Hypertension Maternal Aunt   . Healthy Mother   . Asthma Father     Social History Social History   Tobacco Use  . Smoking status: Never Smoker  . Smokeless tobacco: Never Used  Vaping Use  . Vaping Use: Never used  Substance Use Topics  . Alcohol use: No  . Drug use: No    Review of Systems Constitutional: No fever/chills Eyes: + Intermittent visual changes. ENT: No sore throat. Cardiovascular: Denies chest pain. Respiratory: Denies shortness of breath. Gastrointestinal: No abdominal pain.  + nausea, no vomiting.  No diarrhea.  No constipation. Genitourinary: Negative for dysuria. Musculoskeletal: Negative for back pain. Skin: Negative for rash. Neurological: + headaches, negative for focal weakness or  numbness.   ____________________________________________   PHYSICAL EXAM:  VITAL SIGNS: ED Triage Vitals  Enc Vitals Group     BP 11/27/20 1435 115/82     Pulse Rate 11/27/20 1435 77     Resp 11/27/20 1435 17     Temp 11/27/20 1435 99 F (37.2 C)     Temp Source 11/27/20 1435 Oral     SpO2 11/27/20 1435 98 %     Weight 11/27/20 1418 (!) 326 lb 15.1 oz (148.3 kg)     Height 11/27/20 1418 5\' 7"  (1.702 m)     Head Circumference --      Peak Flow --      Pain Score 11/27/20 1418 6     Pain Loc --      Pain Edu? --      Excl. in GC? --    Constitutional: Alert  and oriented. Well appearing and in no acute distress. Eyes: Conjunctivae are normal. PERRL. EOMI. Head: Atraumatic. Nose: No congestion/rhinnorhea. Mouth/Throat: Mucous membranes are moist.  Oropharynx non-erythematous. Neck: No stridor.   Lymphatic: No cervical lymphadenopathy Cardiovascular: Normal rate, regular rhythm. Grossly normal heart sounds.  Good peripheral circulation. Respiratory: Normal respiratory effort.  No retractions. Lungs CTAB. Gastrointestinal: Obese, soft and nontender. No distention. No abdominal bruits. No CVA tenderness. Musculoskeletal: No lower extremity tenderness nor edema.  No joint effusions. Neurologic:  Normal speech and language.  Cranial nerves II through XII grossly intact.  No gross focal neurologic deficits are appreciated. No gait instability. Skin:  Skin is warm, dry and intact. No rash noted. Psychiatric: Mood and affect are normal. Speech and behavior are normal.  ____________________________________________   LABS (all labs ordered are listed, but only abnormal results are displayed)  Labs Reviewed  URINALYSIS, COMPLETE (UACMP) WITH MICROSCOPIC - Abnormal; Notable for the following components:      Result Value   Color, Urine YELLOW (*)    APPearance HAZY (*)    Leukocytes,Ua SMALL (*)    All other components within normal limits  POC URINE PREG, ED - Normal  POC SARS  CORONAVIRUS 2 AG -  ED - Normal   ____________________________________________   INITIAL IMPRESSION / ASSESSMENT AND PLAN / ED COURSE  As part of my medical decision making, I reviewed the following data within the electronic MEDICAL RECORD NUMBER Nursing notes reviewed and incorporated, Labs reviewed, outside records reviewed from [place] ER        Patient is a 35 year old female who presents to the emergency department for complaint of headache and nausea that has been present for the last few weeks.  Patient reports pain in the temporal regions that has been worsening with associated visual changes.  See HPI for further details.  Patient does not have any gross physical exam abnormalities.  Initial laboratory evaluation will begin with urinalysis, POC pregnancy, POC COVID, CBC and CMP.  We will also initiate treatment of headache with fluid bolus, Decadron and Reglan.  Multiple differentials are currently actively being considered for the patient.  The CBC and CMP were unable to be drawn when starting the IV, however the patient refused to be stuck again for labs, reporting that they were obtained in [place].  Records were reviewed from their ER with limited labs available for review which appeared normal.  After receiving her medication, she reported to a nurse that she was going to leave, and the nurse removed her IV.  No further work-up was obtained and the patient left AMA.       ____________________________________________   FINAL CLINICAL IMPRESSION(S) / ED DIAGNOSES  Final diagnoses:  Migraine without aura and without status migrainosus, not intractable     ED Discharge Orders    None      *Please note:  Diamond Cook was evaluated in Emergency Department on 11/27/2020 for the symptoms described in the history of present illness. She was evaluated in the context of the global COVID-19 pandemic, which necessitated consideration that the patient might be at risk for infection  with the SARS-CoV-2 virus that causes COVID-19. Institutional protocols and algorithms that pertain to the evaluation of patients at risk for COVID-19 are in a state of rapid change based on information released by regulatory bodies including the CDC and federal and state organizations. These policies and algorithms were followed during the patient's care in the ED.  Some ED evaluations  and interventions may be delayed as a result of limited staffing during and the pandemic.*   Note:  This document was prepared using Dragon voice recognition software and may include unintentional dictation errors.    Lucy Chris, PA 11/27/20 2317    Concha Se, MD 12/01/20 [phone]

## 2020-11-27 NOTE — ED Notes (Signed)
Called into pt's room. Pt standing and pacing, states "I am gonna go, I am just so depressed right now". Encouraged pt to stay for results, states "No, I am just gonna go". IV d/cd at this time. Pt walked past nurse and went through exit door. Provider made aware.

## 2020-11-27 NOTE — ED Triage Notes (Signed)
C/O headache and nausea x 2 days.  AAOx3.  Skin warm and dry. NAD

## 2020-11-29 ENCOUNTER — Other Ambulatory Visit: Payer: Self-pay

## 2020-11-29 ENCOUNTER — Emergency Department
Admission: EM | Admit: 2020-11-29 | Discharge: 2020-11-29 | Disposition: A | Discharge status: Home / Self Care | Payer: Medicaid Other | Attending: Emergency Medicine | Admitting: Emergency Medicine

## 2020-11-29 ENCOUNTER — Encounter: Payer: Self-pay | Admitting: Emergency Medicine

## 2020-11-29 DIAGNOSIS — R519 Headache, unspecified: Secondary | ICD-10-CM | POA: Insufficient documentation

## 2020-11-29 DIAGNOSIS — I10 Essential (primary) hypertension: Secondary | ICD-10-CM | POA: Insufficient documentation

## 2020-11-29 DIAGNOSIS — Z79899 Other long term (current) drug therapy: Secondary | ICD-10-CM | POA: Insufficient documentation

## 2020-11-29 DIAGNOSIS — R110 Nausea: Secondary | ICD-10-CM | POA: Insufficient documentation

## 2020-11-29 MED ORDER — KETOROLAC TROMETHAMINE 60 MG/2ML IM SOLN
60.0000 mg | Freq: Once | INTRAMUSCULAR | Status: AC
  Administered 2020-11-29: 60 mg via INTRAMUSCULAR
  Filled 2020-11-29: qty 2

## 2020-11-29 MED ORDER — DIPHENHYDRAMINE HCL 50 MG/ML IJ SOLN
25.0000 mg | Freq: Once | INTRAMUSCULAR | Status: AC
  Administered 2020-11-29: 25 mg via INTRAMUSCULAR
  Filled 2020-11-29: qty 1

## 2020-11-29 MED ORDER — PROCHLORPERAZINE EDISYLATE 10 MG/2ML IJ SOLN
10.0000 mg | Freq: Once | INTRAMUSCULAR | Status: AC
  Administered 2020-11-29: 10 mg via INTRAMUSCULAR
  Filled 2020-11-29: qty 2

## 2020-11-29 MED ORDER — BUTALBITAL-APAP-CAFFEINE 50-325-40 MG PO TABS: 1.0000 | ORAL_TABLET | Freq: Four times a day (QID) | ORAL | 0 refills | Status: DC | PRN

## 2020-11-29 NOTE — ED Triage Notes (Signed)
Pt comes into the ED via POV c/o left temporal headache.  Pt seen 3 days ago but LWBS after triage.  Pt denies any nausea or dizziness.  Pt ambulatory to triage, A&Ox4, and in NAD.

## 2020-11-29 NOTE — ED Provider Notes (Signed)
[facility] Emergency Department Provider Note  ____________________________________________   Event Date/Time   First MD Initiated Contact with Patient 11/29/20 1204     (approximate)  I have reviewed the triage vital signs and the nursing notes.   HISTORY  Chief Complaint Headache    HPI Diamond Cook is a 35 y.o. female with history of anxiety, hypertension, here with headache.  The patient states that off and on for the last several days, she has had intermittent sharp, stabbing, temporal headache.  She has also had occasional right-sided aching, throbbing headache, which is the reason that she came in today.  She states the symptoms have been ongoing fairly often.  She has had some mild nausea.  No vomiting.  No fevers.  No focal numbness or weakness.  Symptoms are largely resolved now.  She has been getting intermittent headaches for some time.  No specific alleviating factors.        Past Medical History:  Diagnosis Date  . Anemia   . Anxiety    no meds  . Depression    no meds  . History of blood transfusion 2013   In [place]  . Hypertension   . Obesity   . Vertigo     Patient Active Problem List   Diagnosis Date Noted  . Menorrhagia with irregular cycle 12/17/2017  . Morbid obesity (HCC) 12/17/2017  . Iron deficiency anemia 12/17/2017  . Benign essential hypertension 12/17/2017    Past Surgical History:  Procedure Laterality Date  . CESAREAN SECTION  2004,2005,2012   x 3 in [place]  . DILATION AND CURETTAGE OF UTERUS N/A 02/24/2018   Procedure: DILATATION AND CURETTAGE;  Surgeon: Reva Bores, MD;  Location: [facility];  Service: Gynecology;  Laterality: N/A;  . INTRAUTERINE DEVICE (IUD) INSERTION N/A 02/24/2018   Procedure: INTRAUTERINE DEVICE (IUD) INSERTION;  Surgeon: Reva Bores, MD;  Location: [facility];  Service: Gynecology;  Laterality: N/A;  . NOVASURE ABLATION     In [place]  . TUBAL LIGATION     in [place]    Prior  to Admission medications   Medication Sig Start Date End Date Taking? Authorizing Provider  butalbital-acetaminophen-caffeine (FIORICET) 50-325-40 MG tablet Take 1 tablet by mouth every 6 (six) hours as needed for migraine. 11/29/20 11/29/21 Yes Shaune Pollack, MD  albuterol (VENTOLIN HFA) 108 (90 Base) MCG/ACT inhaler Inhale 2 puffs into the lungs every 4 (four) hours as needed for wheezing or shortness of breath. 07/08/20   Cuthriell, Delorise Royals, PA-C  brompheniramine-pseudoephedrine-DM 30-2-10 MG/5ML syrup Take 10 mLs by mouth 4 (four) times daily as needed. 07/08/20   Cuthriell, Delorise Royals, PA-C  dicyclomine (BENTYL) 10 MG capsule Take 1 capsule (10 mg total) by mouth 3 (three) times daily before meals for 14 days. 11/03/20 11/17/20  Delton Prairie, MD  ibuprofen (ADVIL) 800 MG tablet Take 1 tablet (800 mg total) by mouth every 8 (eight) hours as needed. Patient not taking: Reported on 05/17/2020 02/29/20   Menshew, Charlesetta Ivory, PA-C  metoprolol succinate (TOPROL-XL) 25 MG 24 hr tablet Take 25 mg by mouth daily.    [provider]  penicillin v potassium (VEETID) 500 MG tablet Take 1 tablet (500 mg total) by mouth 4 (four) times daily. Patient not taking: Reported on 05/17/2020 02/29/20   Menshew, Charlesetta Ivory, PA-C  predniSONE (DELTASONE) 10 MG tablet Take 1 tablet (10 mg total) by mouth daily. 07/08/20   Cuthriell, Delorise Royals, PA-C  ferrous sulfate 325 (65 FE) MG tablet Take 1 tablet (325 mg total) by mouth daily. Patient not taking: Reported on 10/23/2019 05/05/18 12/12/19  Mortis, Jerrel Ivory I, PA-C  hydrochlorothiazide (HYDRODIURIL) 25 MG tablet Take 1 tablet (25 mg total) by mouth daily. Patient not taking: Reported on 10/23/2019 05/18/19 12/12/19  Elson Areas, PA-C    Allergies Patient has no known allergies.  Family History  Problem Relation Age of Onset  . Diabetes Maternal Grandmother   . Hypertension Maternal Grandmother   . Diabetes Maternal Aunt   . Hypertension Maternal Aunt    . Diabetes Maternal Aunt   . Hypertension Maternal Aunt   . Healthy Mother   . Asthma Father     Social History Social History   Tobacco Use  . Smoking status: Never Smoker  . Smokeless tobacco: Never Used  Vaping Use  . Vaping Use: Never used  Substance Use Topics  . Alcohol use: No  . Drug use: No    Review of Systems  Review of Systems  Constitutional: Positive for fatigue. Negative for fever.  HENT: Negative for congestion and sore throat.   Eyes: Negative for visual disturbance.  Respiratory: Negative for cough and shortness of breath.   Cardiovascular: Negative for chest pain.  Gastrointestinal: Negative for abdominal pain, diarrhea, nausea and vomiting.  Genitourinary: Negative for flank pain.  Musculoskeletal: Negative for back pain and neck pain.  Skin: Negative for rash and wound.  Neurological: Positive for headaches. Negative for weakness.  All other systems reviewed and are negative.    ____________________________________________  PHYSICAL EXAM:      VITAL SIGNS: ED Triage Vitals  Enc Vitals Group     BP 11/29/20 1121 119/78     Pulse Rate 11/29/20 1121 77     Resp 11/29/20 1121 17     Temp 11/29/20 1121 99.2 F (37.3 C)     Temp Source 11/29/20 1121 Oral     SpO2 11/29/20 1121 100 %     Weight 11/29/20 1122 (!) 322 lb (146.1 kg)     Height 11/29/20 1122 5\' 7"  (1.702 m)     Head Circumference --      Peak Flow --      Pain Score 11/29/20 1122 0     Pain Loc --      Pain Edu? --      Excl. in GC? --      Physical Exam Vitals and nursing note reviewed.  Constitutional:      General: She is not in acute distress.    Appearance: She is well-developed.  HENT:     Head: Normocephalic and atraumatic.     Mouth/Throat:     Mouth: Mucous membranes are moist.  Eyes:     Conjunctiva/sclera: Conjunctivae normal.  Cardiovascular:     Rate and Rhythm: Normal rate and regular rhythm.     Heart sounds: Normal heart sounds. No murmur heard. No  friction rub.  Pulmonary:     Effort: Pulmonary effort is normal. No respiratory distress.     Breath sounds: Normal breath sounds. No wheezing or rales.  Abdominal:     General: There is no distension.     Palpations: Abdomen is soft.     Tenderness: There is no abdominal tenderness.  Musculoskeletal:     Cervical back: Neck supple.  Skin:    General: Skin is warm.     Capillary Refill: Capillary refill takes less than 2 seconds.  Neurological:  Mental Status: She is alert and oriented to person, place, and time.     Motor: No abnormal muscle tone.     Comments: Neurological Exam:  Mental Status: Alert and oriented to person, place, and time. Attention and concentration normal. Speech clear. Recent memory is intact. Cranial Nerves: Visual fields grossly intact. EOMI and PERRLA. No nystagmus noted. Facial sensation intact at forehead, maxillary cheek, and chin/mandible bilaterally. No facial asymmetry or weakness. Hearing grossly normal. Uvula is midline, and palate elevates symmetrically. Normal SCM and trapezius strength. Tongue midline without fasciculations. Motor: Muscle strength 5/5 in proximal and distal UE and LE bilaterally. No pronator drift. Muscle tone normal.  Sensation: Intact to light touch in upper and lower extremities distally bilaterally.  Gait: Normal without ataxia. Coordination: Normal FTN bilaterally.          ____________________________________________   LABS (all labs ordered are listed, but only abnormal results are displayed)  Labs Reviewed - No data to display  ____________________________________________  EKG:  ________________________________________  RADIOLOGY All imaging, including plain films, CT scans, and ultrasounds, independently reviewed by me, and interpretations confirmed via formal radiology reads.  ED MD interpretation:     Official radiology report(s): No results  found.  ____________________________________________  PROCEDURES   Procedure(s) performed (including Critical Care):  Procedures  ____________________________________________  INITIAL IMPRESSION / MDM / ASSESSMENT AND PLAN / ED COURSE  As part of my medical decision making, I reviewed the following data within the electronic MEDICAL RECORD NUMBER Nursing notes reviewed and incorporated, Old chart reviewed, Notes from prior ED visits, and [place] Controlled Substance Database       *Diamond Cook was evaluated in Emergency Department on 11/29/2020 for the symptoms described in the history of present illness. She was evaluated in the context of the global COVID-19 pandemic, which necessitated consideration that the patient might be at risk for infection with the SARS-CoV-2 virus that causes COVID-19. Institutional protocols and algorithms that pertain to the evaluation of patients at risk for COVID-19 are in a state of rapid change based on information released by regulatory bodies including the CDC and federal and state organizations. These policies and algorithms were followed during the patient's care in the ED.  Some ED evaluations and interventions may be delayed as a result of limited staffing during the pandemic.*     Medical Decision Making: 35 year old female here with fairly atypical headache.  The patient has been seen multiple times for similar headache, and I suspect she has tension type versus migraine versus other primary headache syndrome.  No fever, chills, neck stiffness, or signs of meningitis or encephalitis.  She reports that she had an elevated sed rate in the past, but this could be multiple issues and is nonspecific, and she has no temporal artery tenderness, vision changes, history of autoimmune disease, and I do not suspect temporal arteritis at this time.  She has no signs of sinus fibrosis.  No focal neurological deficits and she had a CT head fairly recently which was  unremarkable.  Will treat for primary headache syndrome and refer to neurology for further work-up.  No apparent emergent pathology.  ____________________________________________  FINAL CLINICAL IMPRESSION(S) / ED DIAGNOSES  Final diagnoses:  Nonintractable headache, unspecified chronicity pattern, unspecified headache type     MEDICATIONS GIVEN DURING THIS VISIT:  Medications  ketorolac (TORADOL) injection 60 mg (60 mg Intramuscular Given 11/29/20 1304)  prochlorperazine (COMPAZINE) injection 10 mg (10 mg Intramuscular Given 11/29/20 1304)  diphenhydrAMINE (BENADRYL) injection 25 mg (  25 mg Intramuscular Given 11/29/20 1304)     ED Discharge Orders         Ordered    butalbital-acetaminophen-caffeine (FIORICET) 50-325-40 MG tablet  Every 6 hours PRN        11/29/20 1347           Note:  This document was prepared using Dragon voice recognition software and may include unintentional dictation errors.   Shaune Pollack, MD 11/29/20 [phone]

## 2020-11-29 NOTE — ED Notes (Signed)
Pt states that headache has improved and is now requesting to be discharged.

## 2020-12-03 ENCOUNTER — Encounter: Payer: Self-pay | Admitting: *Deleted

## 2020-12-03 ENCOUNTER — Emergency Department: Payer: Medicaid Other

## 2020-12-03 ENCOUNTER — Other Ambulatory Visit: Payer: Self-pay

## 2020-12-03 DIAGNOSIS — Z5321 Procedure and treatment not carried out due to patient leaving prior to being seen by health care provider: Secondary | ICD-10-CM | POA: Diagnosis not present

## 2020-12-03 DIAGNOSIS — M79662 Pain in left lower leg: Secondary | ICD-10-CM | POA: Diagnosis not present

## 2020-12-03 DIAGNOSIS — R519 Headache, unspecified: Secondary | ICD-10-CM | POA: Insufficient documentation

## 2020-12-03 DIAGNOSIS — M79661 Pain in right lower leg: Secondary | ICD-10-CM | POA: Insufficient documentation

## 2020-12-03 DIAGNOSIS — M791 Myalgia, unspecified site: Secondary | ICD-10-CM | POA: Diagnosis not present

## 2020-12-03 DIAGNOSIS — R531 Weakness: Secondary | ICD-10-CM | POA: Diagnosis present

## 2020-12-03 LAB — BASIC METABOLIC PANEL
Anion gap: 10 (ref 5–15)
BUN: 19 mg/dL (ref 6–20)
CO2: 26 mmol/L (ref 22–32)
Calcium: 8.9 mg/dL (ref 8.9–10.3)
Chloride: 104 mmol/L (ref 98–111)
Creatinine, Ser: 0.87 mg/dL (ref 0.44–1.00)
GFR, Estimated: >60 mL/min (ref >60)
Glucose, Bld: 90 mg/dL (ref 70–99)
Potassium: 3.9 mmol/L (ref 3.5–5.1)
Sodium: 140 mmol/L (ref 135–145)

## 2020-12-03 LAB — URINALYSIS, COMPLETE (UACMP) WITH MICROSCOPIC
Bacteria, UA: NONE SEEN
Bilirubin Urine: NEGATIVE
Glucose, UA: NEGATIVE mg/dL
Hgb urine dipstick: NEGATIVE
Ketones, ur: NEGATIVE mg/dL
Nitrite: NEGATIVE
Protein, ur: NEGATIVE mg/dL
Specific Gravity, Urine: 1.02 (ref 1.005–1.030)
pH: 6 (ref 5.0–8.0)

## 2020-12-03 LAB — CBC
HCT: 35.2 % — ABNORMAL LOW (ref 36.0–46.0)
Hemoglobin: 11.3 g/dL — ABNORMAL LOW (ref 12.0–15.0)
MCH: 26.5 pg (ref 26.0–34.0)
MCHC: 32.1 g/dL (ref 30.0–36.0)
MCV: 82.4 fL (ref 80.0–100.0)
Platelets: 197 10*3/uL (ref 150–400)
RBC: 4.27 MIL/uL (ref 3.87–5.11)
RDW: 14.1 % (ref 11.5–15.5)
WBC: 6.3 10*3/uL (ref 4.0–10.5)
nRBC: 0 % (ref 0.0–0.2)

## 2020-12-03 LAB — POC URINE PREG, ED: Preg Test, Ur: NEGATIVE

## 2020-12-03 LAB — TROPONIN I (HIGH SENSITIVITY)
Troponin I (High Sensitivity): 3 ng/L (ref <18)
Troponin I (High Sensitivity): <2 ng/L (ref <18)

## 2020-12-03 NOTE — ED Triage Notes (Signed)
Pt ambulatory to triage.  Pt states weakness, headache and feels like she may pass out.  Sx for 3 days.  Pt has pain in both lower legs.  Pt also states pain all over body.  Pt alert   Speech clear.  Pt on cell phone in triage.

## 2020-12-04 ENCOUNTER — Emergency Department
Admission: EM | Admit: 2020-12-04 | Discharge: 2020-12-04 | Disposition: A | Discharge status: Home / Self Care | Payer: Medicaid Other | Attending: Emergency Medicine | Admitting: Emergency Medicine

## 2021-02-28 ENCOUNTER — Other Ambulatory Visit: Payer: Medicaid Other

## 2021-03-27 ENCOUNTER — Ambulatory Visit
Admission: RE | Admit: 2021-03-27 | Discharge: 2021-03-27 | Disposition: A | Discharge status: Home / Self Care | Payer: Medicaid Other | Source: Ambulatory Visit | Attending: Family Medicine | Admitting: Family Medicine

## 2021-03-27 ENCOUNTER — Ambulatory Visit: Payer: Medicaid Other

## 2021-03-27 ENCOUNTER — Other Ambulatory Visit: Payer: Self-pay | Admitting: Family Medicine

## 2021-03-27 ENCOUNTER — Other Ambulatory Visit: Payer: Self-pay

## 2021-03-27 DIAGNOSIS — N6489 Other specified disorders of breast: Secondary | ICD-10-CM

## 2021-04-12 ENCOUNTER — Other Ambulatory Visit: Payer: Self-pay | Admitting: Cardiology

## 2021-04-12 ENCOUNTER — Ambulatory Visit
Admission: RE | Admit: 2021-04-12 | Discharge: 2021-04-12 | Disposition: A | Discharge status: Home / Self Care | Payer: Medicaid Other | Source: Ambulatory Visit | Attending: Cardiology | Admitting: Cardiology

## 2021-04-12 ENCOUNTER — Other Ambulatory Visit: Payer: Self-pay

## 2021-04-12 DIAGNOSIS — R7989 Other specified abnormal findings of blood chemistry: Secondary | ICD-10-CM | POA: Diagnosis not present

## 2021-04-12 DIAGNOSIS — M79605 Pain in left leg: Secondary | ICD-10-CM | POA: Diagnosis present

## 2021-05-24 ENCOUNTER — Emergency Department
Admission: EM | Admit: 2021-05-24 | Discharge: 2021-05-24 | Disposition: A | Discharge status: Home / Self Care | Payer: Medicaid Other | Attending: Student in an Organized Health Care Education/Training Program | Admitting: Student in an Organized Health Care Education/Training Program

## 2021-05-24 ENCOUNTER — Emergency Department: Payer: Medicaid Other

## 2021-05-24 ENCOUNTER — Other Ambulatory Visit: Payer: Self-pay

## 2021-05-24 DIAGNOSIS — U071 COVID-19: Secondary | ICD-10-CM

## 2021-05-24 DIAGNOSIS — R059 Cough, unspecified: Secondary | ICD-10-CM | POA: Diagnosis present

## 2021-05-24 DIAGNOSIS — I10 Essential (primary) hypertension: Secondary | ICD-10-CM | POA: Insufficient documentation

## 2021-05-24 DIAGNOSIS — Z79899 Other long term (current) drug therapy: Secondary | ICD-10-CM | POA: Insufficient documentation

## 2021-05-24 LAB — POC SARS CORONAVIRUS 2 AG -  ED: SARSCOV2ONAVIRUS 2 AG: POSITIVE — AB

## 2021-05-24 MED ORDER — IBUPROFEN 800 MG PO TABS: 800.0000 mg | ORAL_TABLET | Freq: Three times a day (TID) | ORAL | 0 refills | Status: DC | PRN

## 2021-05-24 MED ORDER — IBUPROFEN 800 MG PO TABS
800.0000 mg | ORAL_TABLET | Freq: Once | ORAL | Status: AC
  Administered 2021-05-24: 800 mg via ORAL
  Filled 2021-05-24: qty 1

## 2021-05-24 MED ORDER — BEBTELOVIMAB 175 MG/2 ML IV (EUA)
175.0000 mg | Freq: Once | INTRAMUSCULAR | Status: AC
  Administered 2021-05-24: 175 mg via INTRAVENOUS
  Filled 2021-05-24: qty 2

## 2021-05-24 MED ORDER — PSEUDOEPH-BROMPHEN-DM 30-2-10 MG/5ML PO SYRP: 5.0000 mL | ORAL_SOLUTION | Freq: Four times a day (QID) | ORAL | 0 refills | Status: DC | PRN

## 2021-05-24 NOTE — ED Triage Notes (Signed)
Pt c/o cough congestion and HA since yesterday.

## 2021-05-24 NOTE — ED Notes (Signed)
See triage note  Presents with h/a,sore throat chills and congestion  States sx's started yesterday  She tested positive for COVID yesterday  Currently afebrile

## 2021-05-24 NOTE — Discharge Instructions (Addendum)
Your COVID-19 test was positive.  Chest x-ray was unremarkable.  Read and follow discharge care instructions.  Advised self quarantine for 5 days.  Take medication as directed.

## 2021-05-24 NOTE — ED Provider Notes (Signed)
[facility] Emergency Department Provider Note   ____________________________________________   Event Date/Time   First MD Initiated Contact with Patient 05/24/21 [phone]     (approximate)  I have reviewed the triage vital signs and the nursing notes.   HISTORY  Chief Complaint URI    HPI Diamond Cook is a 35 y.o. female patient presents with cough, nasal congestion, chest congestion, headache.  Patient tested positive for COVID-19 yesterday.  Patient took only 1 dose of the [employer] vaccine.  Voices concern for pneumonia.  Request antibody infusion.      Past Medical History:  Diagnosis Date   Anemia    Anxiety    no meds   Depression    no meds   History of blood transfusion 2013   In [place]   Hypertension    Obesity    Vertigo     Patient Active Problem List   Diagnosis Date Noted   Menorrhagia with irregular cycle 12/17/2017   Morbid obesity (HCC) 12/17/2017   Iron deficiency anemia 12/17/2017   Benign essential hypertension 12/17/2017    Past Surgical History:  Procedure Laterality Date   CESAREAN SECTION  2004,2005,2012   x 3 in [facility]   DILATION AND CURETTAGE OF UTERUS N/A 02/24/2018   Procedure: DILATATION AND CURETTAGE;  Surgeon: Reva Bores, MD;  Location: [facility];  Service: Gynecology;  Laterality: N/A;   INTRAUTERINE DEVICE (IUD) INSERTION N/A 02/24/2018   Procedure: INTRAUTERINE DEVICE (IUD) INSERTION;  Surgeon: Reva Bores, MD;  Location: [facility];  Service: Gynecology;  Laterality: N/A;   NOVASURE ABLATION     In [place]   TUBAL LIGATION     in [facility]    Prior to Admission medications   Medication Sig Start Date End Date Taking? Authorizing Provider  brompheniramine-pseudoephedrine-DM 30-2-10 MG/5ML syrup Take 5 mLs by mouth 4 (four) times daily as needed. 05/24/21  Yes Joni Reining, PA-C  ibuprofen (ADVIL) 800 MG tablet Take 1 tablet (800 mg total) by mouth every 8 (eight) hours as needed. 05/24/21  Yes Joni Reining, PA-C  albuterol (VENTOLIN HFA) 108 (90 Base) MCG/ACT inhaler Inhale 2 puffs into the lungs every 4 (four) hours as needed for wheezing or shortness of breath. 07/08/20   Cuthriell, Delorise Royals, PA-C  butalbital-acetaminophen-caffeine (FIORICET) 50-325-40 MG tablet Take 1 tablet by mouth every 6 (six) hours as needed for migraine. 11/29/20 11/29/21  Shaune Pollack, MD  dicyclomine (BENTYL) 10 MG capsule Take 1 capsule (10 mg total) by mouth 3 (three) times daily before meals for 14 days. 11/03/20 11/17/20  Delton Prairie, MD  metoprolol succinate (TOPROL-XL) 25 MG 24 hr tablet Take 25 mg by mouth daily.    [provider]  ferrous sulfate 325 (65 FE) MG tablet Take 1 tablet (325 mg total) by mouth daily. Patient not taking: Reported on 10/23/2019 05/05/18 12/12/19  Mortis, Jerrel Ivory I, PA-C  hydrochlorothiazide (HYDRODIURIL) 25 MG tablet Take 1 tablet (25 mg total) by mouth daily. Patient not taking: Reported on 10/23/2019 05/18/19 12/12/19  Elson Areas, PA-C    Allergies Patient has no known allergies.  Family History  Problem Relation Age of Onset   Diabetes Maternal Grandmother    Hypertension Maternal Grandmother    Diabetes Maternal Aunt    Hypertension Maternal Aunt    Diabetes Maternal Aunt    Hypertension Maternal Aunt    Healthy Mother    Asthma Father     Social History Social  History   Tobacco Use   Smoking status: Never   Smokeless tobacco: Never  Vaping Use   Vaping Use: Never used  Substance Use Topics   Alcohol use: No   Drug use: No    Review of Systems  Constitutional: No fever/chills Eyes: No visual changes. ENT: No sore throat. Cardiovascular: Denies chest pain. Respiratory: Cough and chest congestion.   Gastrointestinal: No abdominal pain.  No nausea, no vomiting.  No diarrhea.  No constipation. Genitourinary: Negative for dysuria. Musculoskeletal: Negative for back pain. Skin: Negative for rash. Neurological: Negative for headaches, focal  weakness or numbness. Psychiatric: Anxiety Endocrine: Hypertension Hematological/Lymphatic: Anemia   ____________________________________________   PHYSICAL EXAM:  VITAL SIGNS: ED Triage Vitals  Enc Vitals Group     BP 05/24/21 0749 (!) 93/58     Pulse Rate 05/24/21 0749 97     Resp 05/24/21 0749 20     Temp 05/24/21 0749 98.2 F (36.8 C)     Temp Source 05/24/21 0749 Oral     SpO2 05/24/21 0749 96 %     Weight 05/24/21 0753 225 lb 15.5 oz (102.5 kg)     Height 05/24/21 0753 5\' 7"  (1.702 m)     Head Circumference --      Peak Flow --      Pain Score --      Pain Loc --      Pain Edu? --      Excl. in GC? --     Constitutional: Alert and oriented. Well appearing and in no acute distress.  BMI 35.39. Eyes: Conjunctivae are normal. PERRL. EOMI. Head: Atraumatic. Nose: No congestion/rhinnorhea. Mouth/Throat: Mucous membranes are moist.  Oropharynx non-erythematous. Neck: No stridor.   Hematological/Lymphatic/Immunilogical: No cervical lymphadenopathy. Cardiovascular: Normal rate, regular rhythm. Grossly normal heart sounds.  Good peripheral circulation. Respiratory: Normal respiratory effort.  No retractions. Lungs CTAB. Gastrointestinal: Soft and nontender.  Distention secondary to body habitus. No abdominal bruits. No CVA tenderness. Genitourinary: Deferred Musculoskeletal: No lower extremity tenderness nor edema.  No joint effusions. Neurologic:  Normal speech and language. No gross focal neurologic deficits are appreciated. No gait instability. Skin:  Skin is warm, dry and intact. No rash noted. Psychiatric: Mood and affect are normal. Speech and behavior are normal.  ____________________________________________   LABS (all labs ordered are listed, but only abnormal results are displayed)  Labs Reviewed  POC SARS CORONAVIRUS 2 AG -  ED - Abnormal; Notable for the following components:      Result Value   SARSCOV2ONAVIRUS 2 AG POSITIVE (*)    All other  components within normal limits  POC SARS CORONAVIRUS 2 AG -  ED   ____________________________________________  EKG   ____________________________________________  RADIOLOGY , personally viewed and evaluated these images (plain radiographs) as part of my medical decision making, as well as reviewing the written report by the radiologist.  ED MD interpretation: No acute findings on chest x-ray.  Official radiology report(s): DG Chest Portable 1 View  Result Date: 05/24/2021 CLINICAL DATA:  Cough, congestion, and positive COVID test yesterday, history hypertension EXAM: PORTABLE CHEST 1 VIEW COMPARISON:  Portable exam 0808 hours compared to 03/31/2021 FINDINGS: Upper normal heart size. Mediastinal contours and pulmonary vascularity normal. Lungs clear. No infiltrate, pleural effusion, or pneumothorax. No acute osseous findings. IMPRESSION: No acute abnormalities. Electronically Signed   By: 04/02/2021 M.D.   On: 05/24/2021 08:44    ____________________________________________   PROCEDURES  Procedure(s) performed (including Critical Care):  Procedures   ____________________________________________   INITIAL IMPRESSION / ASSESSMENT AND PLAN / ED COURSE  As part of my medical decision making, I reviewed the following data within the electronic MEDICAL RECORD NUMBER         Patient presents with positive COVID-19 home test.  Test was confirmed with rapid testing today.  Discussed with patient no acute findings on chest x-ray.  Patient received antibody infusion and given discharge care instruction.  Patient will quarantine per CDC recommendations and take medication as directed.  Return to ED if condition worsens.      ____________________________________________   FINAL CLINICAL IMPRESSION(S) / ED DIAGNOSES  Final diagnoses:  COVID-19     ED Discharge Orders          Ordered    brompheniramine-pseudoephedrine-DM 30-2-10 MG/5ML syrup  4 times daily PRN         05/24/21 0932    ibuprofen (ADVIL) 800 MG tablet  Every 8 hours PRN        05/24/21 0932             Note:  This document was prepared using Dragon voice recognition software and may include unintentional dictation errors.    Joni Reining, PA-C 05/24/21 [id number]    Willy Eddy, MD 05/24/21 1011

## 2021-06-05 ENCOUNTER — Emergency Department (HOSPITAL_COMMUNITY)
Admission: EM | Admit: 2021-06-05 | Discharge: 2021-06-05 | Disposition: A | Discharge status: Home / Self Care | Payer: Medicaid Other | Attending: Emergency Medicine | Admitting: Emergency Medicine

## 2021-06-05 ENCOUNTER — Emergency Department (HOSPITAL_COMMUNITY): Payer: Medicaid Other

## 2021-06-05 ENCOUNTER — Encounter (HOSPITAL_COMMUNITY): Payer: Self-pay | Admitting: Emergency Medicine

## 2021-06-05 DIAGNOSIS — Z8616 Personal history of COVID-19: Secondary | ICD-10-CM | POA: Insufficient documentation

## 2021-06-05 DIAGNOSIS — R0789 Other chest pain: Secondary | ICD-10-CM

## 2021-06-05 DIAGNOSIS — Z79899 Other long term (current) drug therapy: Secondary | ICD-10-CM | POA: Diagnosis not present

## 2021-06-05 DIAGNOSIS — R079 Chest pain, unspecified: Secondary | ICD-10-CM | POA: Diagnosis present

## 2021-06-05 DIAGNOSIS — I10 Essential (primary) hypertension: Secondary | ICD-10-CM | POA: Diagnosis not present

## 2021-06-05 LAB — CBC
HCT: 36.7 % (ref 36.0–46.0)
Hemoglobin: 11.1 g/dL — ABNORMAL LOW (ref 12.0–15.0)
MCH: 25 pg — ABNORMAL LOW (ref 26.0–34.0)
MCHC: 30.2 g/dL (ref 30.0–36.0)
MCV: 82.7 fL (ref 80.0–100.0)
Platelets: 218 10*3/uL (ref 150–400)
RBC: 4.44 MIL/uL (ref 3.87–5.11)
RDW: 15.1 % (ref 11.5–15.5)
WBC: 5.8 10*3/uL (ref 4.0–10.5)
nRBC: 0 % (ref 0.0–0.2)

## 2021-06-05 LAB — BASIC METABOLIC PANEL
Anion gap: 10 (ref 5–15)
BUN: 17 mg/dL (ref 6–20)
CO2: 25 mmol/L (ref 22–32)
Calcium: 8.9 mg/dL (ref 8.9–10.3)
Chloride: 105 mmol/L (ref 98–111)
Creatinine, Ser: 0.83 mg/dL (ref 0.44–1.00)
GFR, Estimated: >60 mL/min (ref >60)
Glucose, Bld: 110 mg/dL — ABNORMAL HIGH (ref 70–99)
Potassium: 3.8 mmol/L (ref 3.5–5.1)
Sodium: 140 mmol/L (ref 135–145)

## 2021-06-05 LAB — TROPONIN I (HIGH SENSITIVITY)
Troponin I (High Sensitivity): 3 ng/L (ref <18)
Troponin I (High Sensitivity): 3 ng/L (ref <18)

## 2021-06-05 LAB — I-STAT BETA HCG BLOOD, ED (MC, WL, AP ONLY): I-stat hCG, quantitative: <5 m[IU]/mL (ref <5)

## 2021-06-05 MED ORDER — ESOMEPRAZOLE MAGNESIUM 40 MG PO CPDR: 40.0000 mg | DELAYED_RELEASE_CAPSULE | Freq: Every day | ORAL | 0 refills | Status: DC

## 2021-06-05 NOTE — Discharge Instructions (Addendum)
Your heart enzyme tests are normal right now.   You can take Nexium daily   Please follow-up with your doctor.  If you have persistent pain you can talk to your doctor about getting stress test  Return to ER if you have worse chest pain, trouble breathing

## 2021-06-05 NOTE — ED Triage Notes (Signed)
Patient complains of intermittent left sided chest and shoulder aching pain that started at approximately 1200 today while driving. Patient states pain has completely resolved at this time. Patient is alert, oriented, and in no apparent distress at this time.

## 2021-06-05 NOTE — ED Notes (Signed)
DC  instructions reviewed with pt.  PT verbalized instructions. Pt DC 

## 2021-06-05 NOTE — ED Provider Notes (Signed)
[facility] EMERGENCY DEPARTMENT Provider Note   CSN: [id number] Arrival date & time: 06/05/21  1228     History Chief Complaint  Patient presents with   Chest Pain    Diamond Cook is a 35 y.o. female history of hypertension, here presenting with chest pain.  Patient states that she was driving to work and had left-sided chest pain.  She states that the pain lasted several seconds.  She had another episode just prior to arrival and another episode on the right side of her chest since she has been here.  Of note, patient has recovered from COVID infection 2 weeks ago.  Patient also had previous visits for chest pain and had negative CTA 2 months ago for elevated D-dimer.  Patient has no known CAD or stents.   The history is provided by the patient.      Past Medical History:  Diagnosis Date   Anemia    Anxiety    no meds   Depression    no meds   History of blood transfusion 2013   In [place]   Hypertension    Obesity    Vertigo     Patient Active Problem List   Diagnosis Date Noted   Menorrhagia with irregular cycle 12/17/2017   Morbid obesity (HCC) 12/17/2017   Iron deficiency anemia 12/17/2017   Benign essential hypertension 12/17/2017    Past Surgical History:  Procedure Laterality Date   CESAREAN SECTION  2004,2005,2012   x 3 in [facility]   DILATION AND CURETTAGE OF UTERUS N/A 02/24/2018   Procedure: DILATATION AND CURETTAGE;  Surgeon: Reva Bores, MD;  Location: [facility];  Service: Gynecology;  Laterality: N/A;   INTRAUTERINE DEVICE (IUD) INSERTION N/A 02/24/2018   Procedure: INTRAUTERINE DEVICE (IUD) INSERTION;  Surgeon: Reva Bores, MD;  Location: [facility];  Service: Gynecology;  Laterality: N/A;   NOVASURE ABLATION     In [place]   TUBAL LIGATION     in [place]     OB History     Gravida  3   Para  3   Term  3   Preterm      AB      Living  3      SAB      IAB      Ectopic      Multiple      Live Births  3            Family History  Problem Relation Age of Onset   Diabetes Maternal Grandmother    Hypertension Maternal Grandmother    Diabetes Maternal Aunt    Hypertension Maternal Aunt    Diabetes Maternal Aunt    Hypertension Maternal Aunt    Healthy Mother    Asthma Father     Social History   Tobacco Use   Smoking status: Never   Smokeless tobacco: Never  Vaping Use   Vaping Use: Never used  Substance Use Topics   Alcohol use: No   Drug use: No    Home Medications Prior to Admission medications   Medication Sig Start Date End Date Taking? Authorizing Provider  esomeprazole (NEXIUM) 40 MG capsule Take 1 capsule (40 mg total) by mouth daily. 06/05/21  Yes Charlynne Pander, MD  albuterol (VENTOLIN HFA) 108 (90 Base) MCG/ACT inhaler Inhale 2 puffs into the lungs every 4 (four) hours as needed for wheezing or shortness of breath. 07/08/20   Cuthriell, Delorise Royals,  PA-C  brompheniramine-pseudoephedrine-DM 30-2-10 MG/5ML syrup Take 5 mLs by mouth 4 (four) times daily as needed. 05/24/21   Joni Reining, PA-C  butalbital-acetaminophen-caffeine (FIORICET) [phone] MG tablet Take 1 tablet by mouth every 6 (six) hours as needed for migraine. 11/29/20 11/29/21  Shaune Pollack, MD  dicyclomine (BENTYL) 10 MG capsule Take 1 capsule (10 mg total) by mouth 3 (three) times daily before meals for 14 days. 11/03/20 11/17/20  Delton Prairie, MD  ibuprofen (ADVIL) 800 MG tablet Take 1 tablet (800 mg total) by mouth every 8 (eight) hours as needed. 05/24/21   Joni Reining, PA-C  metoprolol succinate (TOPROL-XL) 25 MG 24 hr tablet Take 25 mg by mouth daily.    [provider]  ferrous sulfate 325 (65 FE) MG tablet Take 1 tablet (325 mg total) by mouth daily. Patient not taking: Reported on 10/23/2019 05/05/18 12/12/19  Mortis, Jerrel Ivory I, PA-C  hydrochlorothiazide (HYDRODIURIL) 25 MG tablet Take 1 tablet (25 mg total) by mouth daily. Patient not taking: Reported on 10/23/2019 05/18/19 12/12/19  Elson Areas, PA-C    Allergies    Patient has no known allergies.  Review of Systems   Review of Systems  Cardiovascular:  Positive for chest pain.  All other systems reviewed and are negative.  Physical Exam Updated Vital Signs BP (!) 135/93 (BP Location: Left Arm)   Pulse 70   Temp 98.6 F (37 C) (Oral)   Resp 17   SpO2 100%   Physical Exam Vitals and nursing note reviewed.  Constitutional:      Appearance: She is well-developed.     Comments: Overweight, comfortable   HENT:     Head: Normocephalic.  Cardiovascular:     Rate and Rhythm: Normal rate and regular rhythm.     Heart sounds: Normal heart sounds.  Pulmonary:     Effort: Pulmonary effort is normal.     Breath sounds: Normal breath sounds.  Abdominal:     General: Bowel sounds are normal.     Palpations: Abdomen is soft.  Musculoskeletal:        General: Normal range of motion.     Cervical back: Normal range of motion and neck supple.  Skin:    General: Skin is warm.     Capillary Refill: Capillary refill takes less than 2 seconds.  Neurological:     General: No focal deficit present.     Mental Status: She is alert and oriented to person, place, and time.  Psychiatric:        Mood and Affect: Mood normal.        Behavior: Behavior normal.    ED Results / Procedures / Treatments   Labs (all labs ordered are listed, but only abnormal results are displayed) Labs Reviewed  BASIC METABOLIC PANEL - Abnormal; Notable for the following components:      Result Value   Glucose, Bld 110 (*)    All other components within normal limits  CBC - Abnormal; Notable for the following components:   Hemoglobin 11.1 (*)    MCH 25.0 (*)    All other components within normal limits  I-STAT BETA HCG BLOOD, ED ([facility], [facility], [facility] ONLY)  TROPONIN I (HIGH SENSITIVITY)  TROPONIN I (HIGH SENSITIVITY)    EKG EKG Interpretation  Date/Time:  Wednesday June 05 2021 12:46:39 EDT Ventricular Rate:  79 PR Interval:  152 QRS  Duration: 98 QT Interval:  378 QTC Calculation: 433 R Axis:   77 Text Interpretation:  Normal sinus rhythm Normal ECG No significant change since last tracing Confirmed by Richardean Canal ([id number]) on 06/05/2021 8:05:16 PM  Radiology DG Chest 2 View  Result Date: 06/05/2021 CLINICAL DATA:  Chest pain. EXAM: CHEST - 2 VIEW COMPARISON:  12/03/2020. FINDINGS: Mediastinum and hilar structures normal. Heart size stable. No focal infiltrate. No pleural effusion or pneumothorax. No acute bony abnormality. IMPRESSION: No acute cardiopulmonary disease. Electronically Signed   By: Maisie Fus  Register   On: 06/05/2021 13:55    Procedures Procedures   Medications Ordered in ED Medications - No data to display  ED Course  I have reviewed the triage vital signs and the nursing notes.  Pertinent labs & imaging results that were available during my care of the patient were reviewed by me and considered in my medical decision making (see chart for details).    MDM Rules/Calculators/A&P                          Alia L Lebron is a 35 y.o. female here presenting with chest pain.  Patient has chest pain just prior to arrival.  Patient had chest pain several weeks ago and was evaluated in the ED and had negative CTA and normal troponins.  Patient has no stents in her heart.  Patient is overweight and I suspect some component of reflux.  Since her troponins are negative x2 and labs unremarkable and chest were normal, I think she is low risk for outpatient follow-up.  I prescribed Nexium daily   Final Clinical Impression(s) / ED Diagnoses Final diagnoses:  Other chest pain    Rx / DC Orders ED Discharge Orders          Ordered    esomeprazole (NEXIUM) 40 MG capsule  Daily        06/05/21 2017             Charlynne Pander, MD 06/05/21 2025

## 2021-06-17 IMAGING — DX DG CHEST 1V
1 series · 1 of 1 positions shown · non-contrast
Comparison: 05/17/2020 and prior radiographs

CLINICAL DATA: Cough.  COVID positive.

EXAM:
CHEST  1 VIEW

[chest ap]
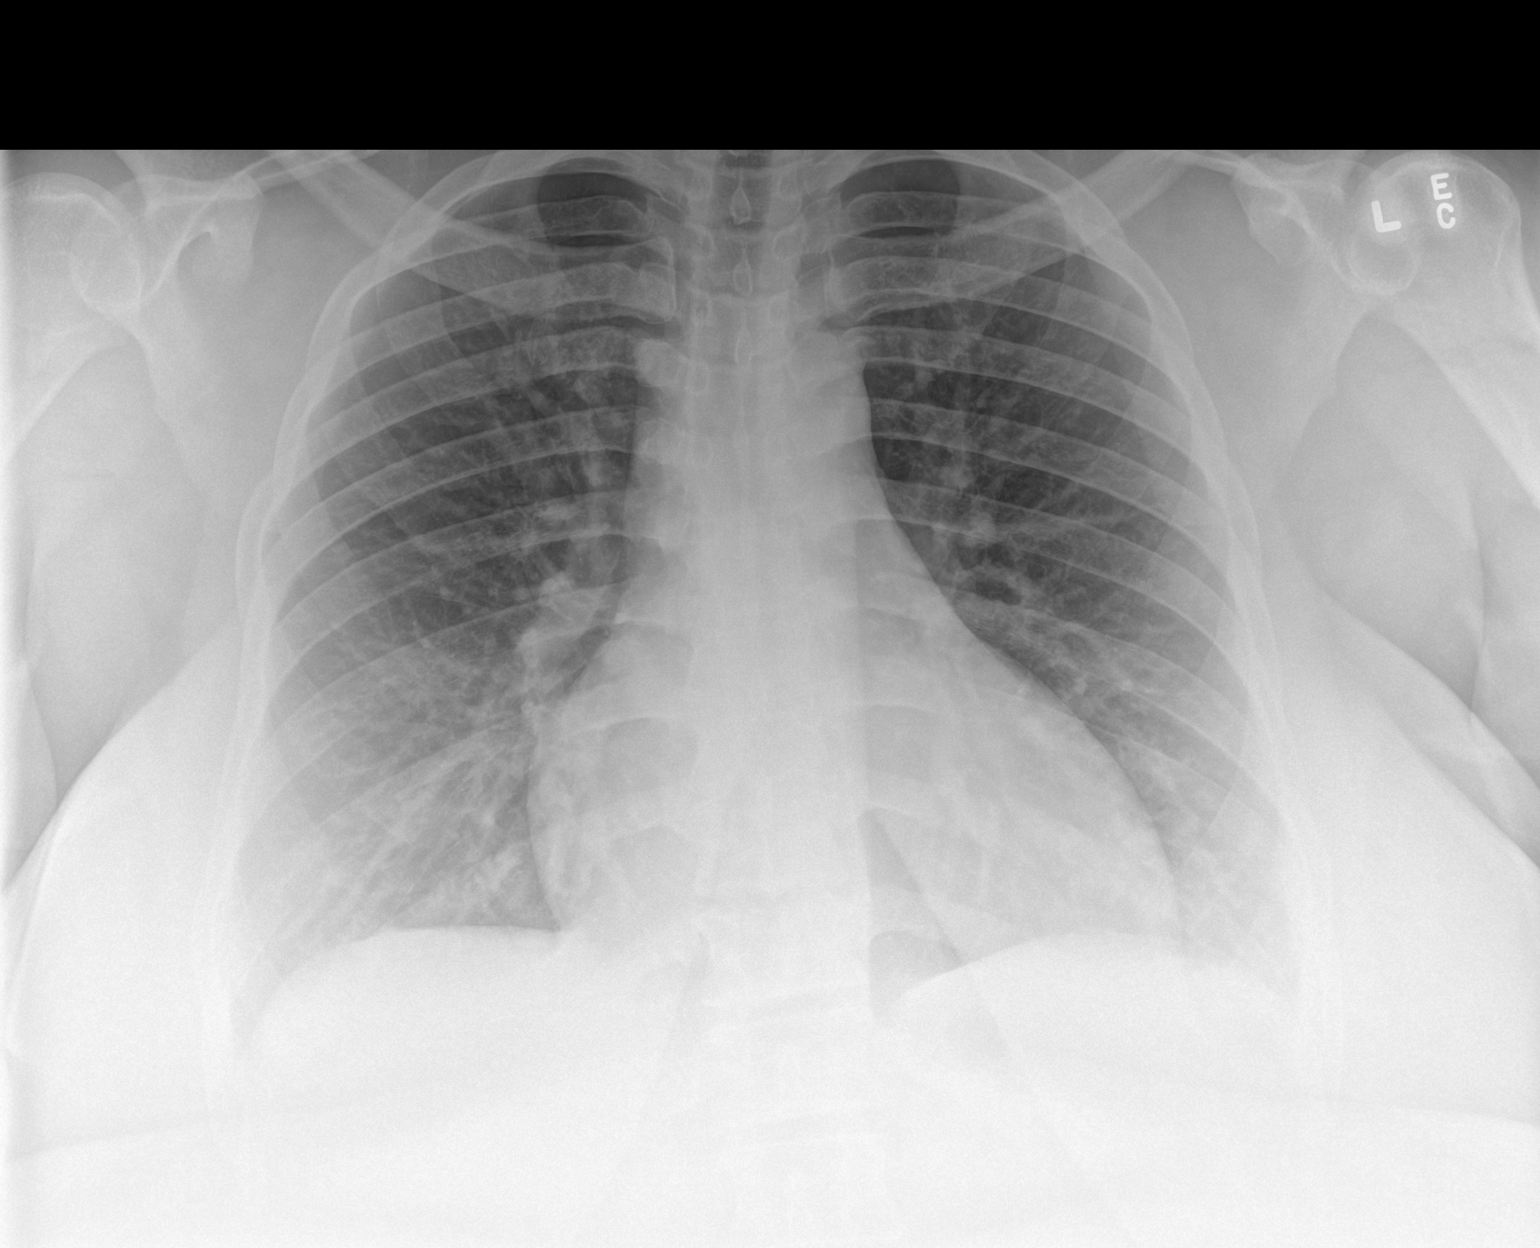

[1 of 1 positions shown; findings below may reference images not displayed]

FINDINGS: The cardiomediastinal silhouette is unremarkable.

Mild chronic peribronchial thickening again noted.

There is no evidence of focal airspace disease, pulmonary edema,
suspicious pulmonary nodule/mass, pleural effusion, or pneumothorax.

No acute bony abnormalities are identified.
IMPRESSION: No active disease.

## 2021-09-03 ENCOUNTER — Other Ambulatory Visit: Payer: Medicaid Other

## 2021-09-09 NOTE — Progress Notes (Signed)
PCP:  Diamond Able, MD   Chief Complaint  Patient presents with   Gynecologic Exam   Vaginal Odor    Some discharge, no itchiness     HPI:      Diamond Cook is a 35 y.o. [id number] whose LMP was Patient's last menstrual period was 09/10/2021 (exact date)., presents today for her NP annual examination.  Her menses are regular every 28-30 days, lasting 8-12 days with IUD. Was 3-5 days after placement  4/19 but getting longer now. Flow is light to mod, just lasts a long time. Has occas BTB for a day, light flow. Mild dysmenorrhea. Hx of novasure ablation, and IUD, placed 4/19 for menorrhagia.Pt unsure which IUD she has. Was told it lasted "4 yrs" at the time which isn't any of them. Doesn't have IUD card.   Sex activity: single partner, contraception - tubal ligation.  Last Pap: 12/17/17  Results were: no abnormalities /neg HPV DNA; no hx of abn paps. Hx of STDs: none  Hx of BV in past, confirmed on culture. Has been having fishy odor, increased vag d/c, min irritation since 8/21. Uses dove sens skin soap, dryer sheets, cotton underwear. Not doing probiotics/boric acid supp. No new partners.   Mammogram: 03/27/21 RT breast; cat 3--repeat in 6 months. Has appt 11/22 for bilat mammo. Had sebaceous cyst RT breast 10/21 but RT asymmetry noted on mammo. Getting Q6 mo imaging currently for stability.  There is no FH of breast cancer. There is no FH of ovarian cancer. The patient does do self-breast exams.  Tobacco use: The patient denies current or previous tobacco use. Alcohol use: none No drug use.  Exercise: moderately active  She does get adequate calcium and Vitamin D in her diet.  Past Medical History:  Diagnosis Date   Anemia    Anxiety    no meds   Depression    no meds   History of blood transfusion 2013   In [place]   Hypertension    Obesity    Vertigo     Past Surgical History:  Procedure Laterality Date   CESAREAN SECTION  [phone]   x 3 in [facility]    DILATION AND CURETTAGE OF UTERUS N/A 02/24/2018   Procedure: DILATATION AND CURETTAGE;  Surgeon: Reva Bores, MD;  Location: [facility];  Service: Gynecology;  Laterality: N/A;   INTRAUTERINE DEVICE (IUD) INSERTION N/A 02/24/2018   Procedure: INTRAUTERINE DEVICE (IUD) INSERTION;  Surgeon: Reva Bores, MD;  Location: [facility];  Service: Gynecology;  Laterality: N/A;   NOVASURE ABLATION     In [place]   TUBAL LIGATION     in [place]    Family History  Problem Relation Age of Onset   Diabetes Maternal Grandmother    Hypertension Maternal Grandmother    Diabetes Maternal Aunt    Hypertension Maternal Aunt    Diabetes Maternal Aunt    Hypertension Maternal Aunt    Healthy Mother    Asthma Father     Social History   Socioeconomic History   Marital status: Married    Spouse name: Not on file   Number of children: Not on file   Years of education: Not on file   Highest education level: Not on file  Occupational History   Not on file  Tobacco Use   Smoking status: Never   Smokeless tobacco: Never  Vaping Use   Vaping Use: Never used  Substance and Sexual Activity   Alcohol  use: No   Drug use: No   Sexual activity: Yes    Birth control/protection: Surgical, I.U.D.    Comment: Tubal Ligation  Other Topics Concern   Not on file  Social History Narrative   Not on file   Social Determinants of Health   Financial Resource Strain: Not on file  Food Insecurity: Not on file  Transportation Needs: Not on file  Physical Activity: Not on file  Stress: Not on file  Social Connections: Not on file  Intimate Partner Violence: Not on file     Current Outpatient Medications:    esomeprazole (NEXIUM) 40 MG capsule, Take 1 capsule (40 mg total) by mouth daily., Disp: 30 capsule, Rfl: 0   FLUoxetine (PROZAC) 20 MG capsule, Take 20 mg by mouth daily., Disp: , Rfl:    hydrOXYzine (VISTARIL) 25 MG capsule, Take 25 mg by mouth 3 (three) times daily., Disp: , Rfl:    ibuprofen (ADVIL)  800 MG tablet, Take 1 tablet (800 mg total) by mouth every 8 (eight) hours as needed., Disp: 30 tablet, Rfl: 0   metoprolol succinate (TOPROL-XL) 25 MG 24 hr tablet, Take 25 mg by mouth daily., Disp: , Rfl:    metroNIDAZOLE (FLAGYL) 500 MG tablet, Take 1 tablet (500 mg total) by mouth 2 (two) times daily for 7 days., Disp: 14 tablet, Rfl: 0     ROS:  Review of Systems  Constitutional:  Negative for fatigue, fever and unexpected weight change.  Respiratory:  Negative for cough, shortness of breath and wheezing.   Cardiovascular:  Negative for chest pain, palpitations and leg swelling.  Gastrointestinal:  Negative for blood in stool, constipation, diarrhea, nausea and vomiting.  Endocrine: Negative for cold intolerance, heat intolerance and polyuria.  Genitourinary:  Positive for vaginal bleeding and vaginal discharge. Negative for dyspareunia, dysuria, flank pain, frequency, genital sores, hematuria, menstrual problem, pelvic pain, urgency and vaginal pain.  Musculoskeletal:  Negative for back pain, joint swelling and myalgias.  Skin:  Negative for rash.  Neurological:  Negative for dizziness, syncope, light-headedness, numbness and headaches.  Hematological:  Negative for adenopathy.  Psychiatric/Behavioral:  Negative for agitation, confusion, sleep disturbance and suicidal ideas. The patient is not nervous/anxious.   BREAST: No symptoms   Objective: BP 120/90   Ht 5\' 7"  (1.702 m)   Wt (!) 326 lb (147.9 kg)   LMP 09/10/2021 (Exact Date)   BMI 51.06 kg/m    Physical Exam Constitutional:      Appearance: She is well-developed.  Genitourinary:     Vulva normal.     Right Labia: No rash, tenderness or lesions.    Left Labia: No tenderness, lesions or rash.    Vaginal bleeding present.     No vaginal discharge, erythema or tenderness.      Right Adnexa: not tender and no mass present.    Left Adnexa: not tender and no mass present.    No cervical friability or polyp.     IUD  strings visualized.     Uterus is not enlarged or tender.  Breasts:    Right: No mass, nipple discharge, skin change or tenderness.     Left: No mass, nipple discharge, skin change or tenderness.  Neck:     Thyroid: No thyromegaly.  Cardiovascular:     Rate and Rhythm: Normal rate and regular rhythm.     Heart sounds: Normal heart sounds. No murmur heard. Pulmonary:     Effort: Pulmonary effort is normal.  Breath sounds: Normal breath sounds.  Abdominal:     Palpations: Abdomen is soft.     Tenderness: There is no abdominal tenderness. There is no guarding or rebound.  Musculoskeletal:        General: Normal range of motion.     Cervical back: Normal range of motion.  Lymphadenopathy:     Cervical: No cervical adenopathy.  Neurological:     General: No focal deficit present.     Mental Status: She is alert and oriented to person, place, and time.     Cranial Nerves: No cranial nerve deficit.  Skin:    General: Skin is warm and dry.  Psychiatric:        Mood and Affect: Mood normal.        Behavior: Behavior normal.        Thought Content: Thought content normal.        Judgment: Judgment normal.  Vitals reviewed.    Results: Results for orders placed or performed in visit on 09/10/21 (from the past 24 hour(s))  POCT Wet Prep with KOH     Status: Abnormal   Collection Time: 09/10/21 10:57 AM  Result Value Ref Range   Trichomonas, UA Negative    Clue Cells Wet Prep HPF POC pos    Epithelial Wet Prep HPF POC     Yeast Wet Prep HPF POC neg    Bacteria Wet Prep HPF POC     RBC Wet Prep HPF POC     WBC Wet Prep HPF POC     KOH Prep POC Positive (A) Negative    Assessment/Plan: Encounter for annual routine gynecological examination  Encounter for routine checking of intrauterine contraceptive device (IUD) - Plan: US PELVIS TRANSVAGINAL NON-OB (TV ONLY); IUD strings in cx os. Check Gyn u/s due to longer periods. Unsure which IUD she has. Discussed replacement with  Mirena if not in correct location vs sx persisting.   Irregular bleeding - Plan: US PELVIS TRANSVAGINAL NON-OB (TV ONLY)  BV (bacterial vaginosis) - Plan: POCT Wet Prep with KOH, metroNIDAZOLE (FLAGYL) 500 MG tablet; pos sx and wet prep. Rx flagyl, no EtoH. Long hx of sx. Add probiotics, boric acid supp.  Line dry underwear/condoms for now.    Meds ordered this encounter  Medications   metroNIDAZOLE (FLAGYL) 500 MG tablet    Sig: Take 1 tablet (500 mg total) by mouth 2 (two) times daily for 7 days.    Dispense:  14 tablet    Refill:  0    Order Specific Question:   Supervising Provider    Answer:   Nadara Mustard [[id number]]              GYN counsel breast self exam, mammography screening, adequate intake of calcium and vitamin D, diet and exercise     F/U  Return in about 3 days (around 09/13/2021) for GYN u/s--[facility] to call pt.  Dyna Figuereo B. Arinze Rivadeneira, PA-C 09/10/2021 10:57 AM

## 2021-09-10 ENCOUNTER — Encounter: Payer: Self-pay | Admitting: Obstetrics and Gynecology

## 2021-09-10 ENCOUNTER — Other Ambulatory Visit: Payer: Self-pay

## 2021-09-10 ENCOUNTER — Ambulatory Visit (INDEPENDENT_AMBULATORY_CARE_PROVIDER_SITE_OTHER): Payer: Medicaid Other | Admitting: Obstetrics and Gynecology

## 2021-09-10 VITALS — BP 120/90 | Ht 67.0 in | Wt 326.0 lb

## 2021-09-10 DIAGNOSIS — Z30431 Encounter for routine checking of intrauterine contraceptive device: Secondary | ICD-10-CM | POA: Diagnosis not present

## 2021-09-10 DIAGNOSIS — Z01419 Encounter for gynecological examination (general) (routine) without abnormal findings: Secondary | ICD-10-CM

## 2021-09-10 DIAGNOSIS — N760 Acute vaginitis: Secondary | ICD-10-CM | POA: Diagnosis not present

## 2021-09-10 DIAGNOSIS — N926 Irregular menstruation, unspecified: Secondary | ICD-10-CM

## 2021-09-10 DIAGNOSIS — B9689 Other specified bacterial agents as the cause of diseases classified elsewhere: Secondary | ICD-10-CM | POA: Insufficient documentation

## 2021-09-10 LAB — POCT WET PREP WITH KOH
Clue Cells Wet Prep HPF POC: POSITIVE
KOH Prep POC: POSITIVE — AB
Trichomonas, UA: NEGATIVE
Yeast Wet Prep HPF POC: NEGATIVE

## 2021-09-10 MED ORDER — METRONIDAZOLE 500 MG PO TABS: 500.0000 mg | ORAL_TABLET | Freq: Two times a day (BID) | ORAL | 0 refills | Status: AC

## 2021-09-10 NOTE — Patient Instructions (Signed)
I value your feedback and you entrusting us with your care. If you get a [place] patient survey, I would appreciate you taking the time to let us know about your experience today. Thank you! ? ? ?

## 2021-09-24 IMAGING — CR DG CHEST 2V
2 series · 2 of 2 positions shown · non-contrast
Comparison: 10/05/2020

CLINICAL DATA: Palpitations and shortness of breath.

EXAM:
CHEST - 2 VIEW

[chest pa]
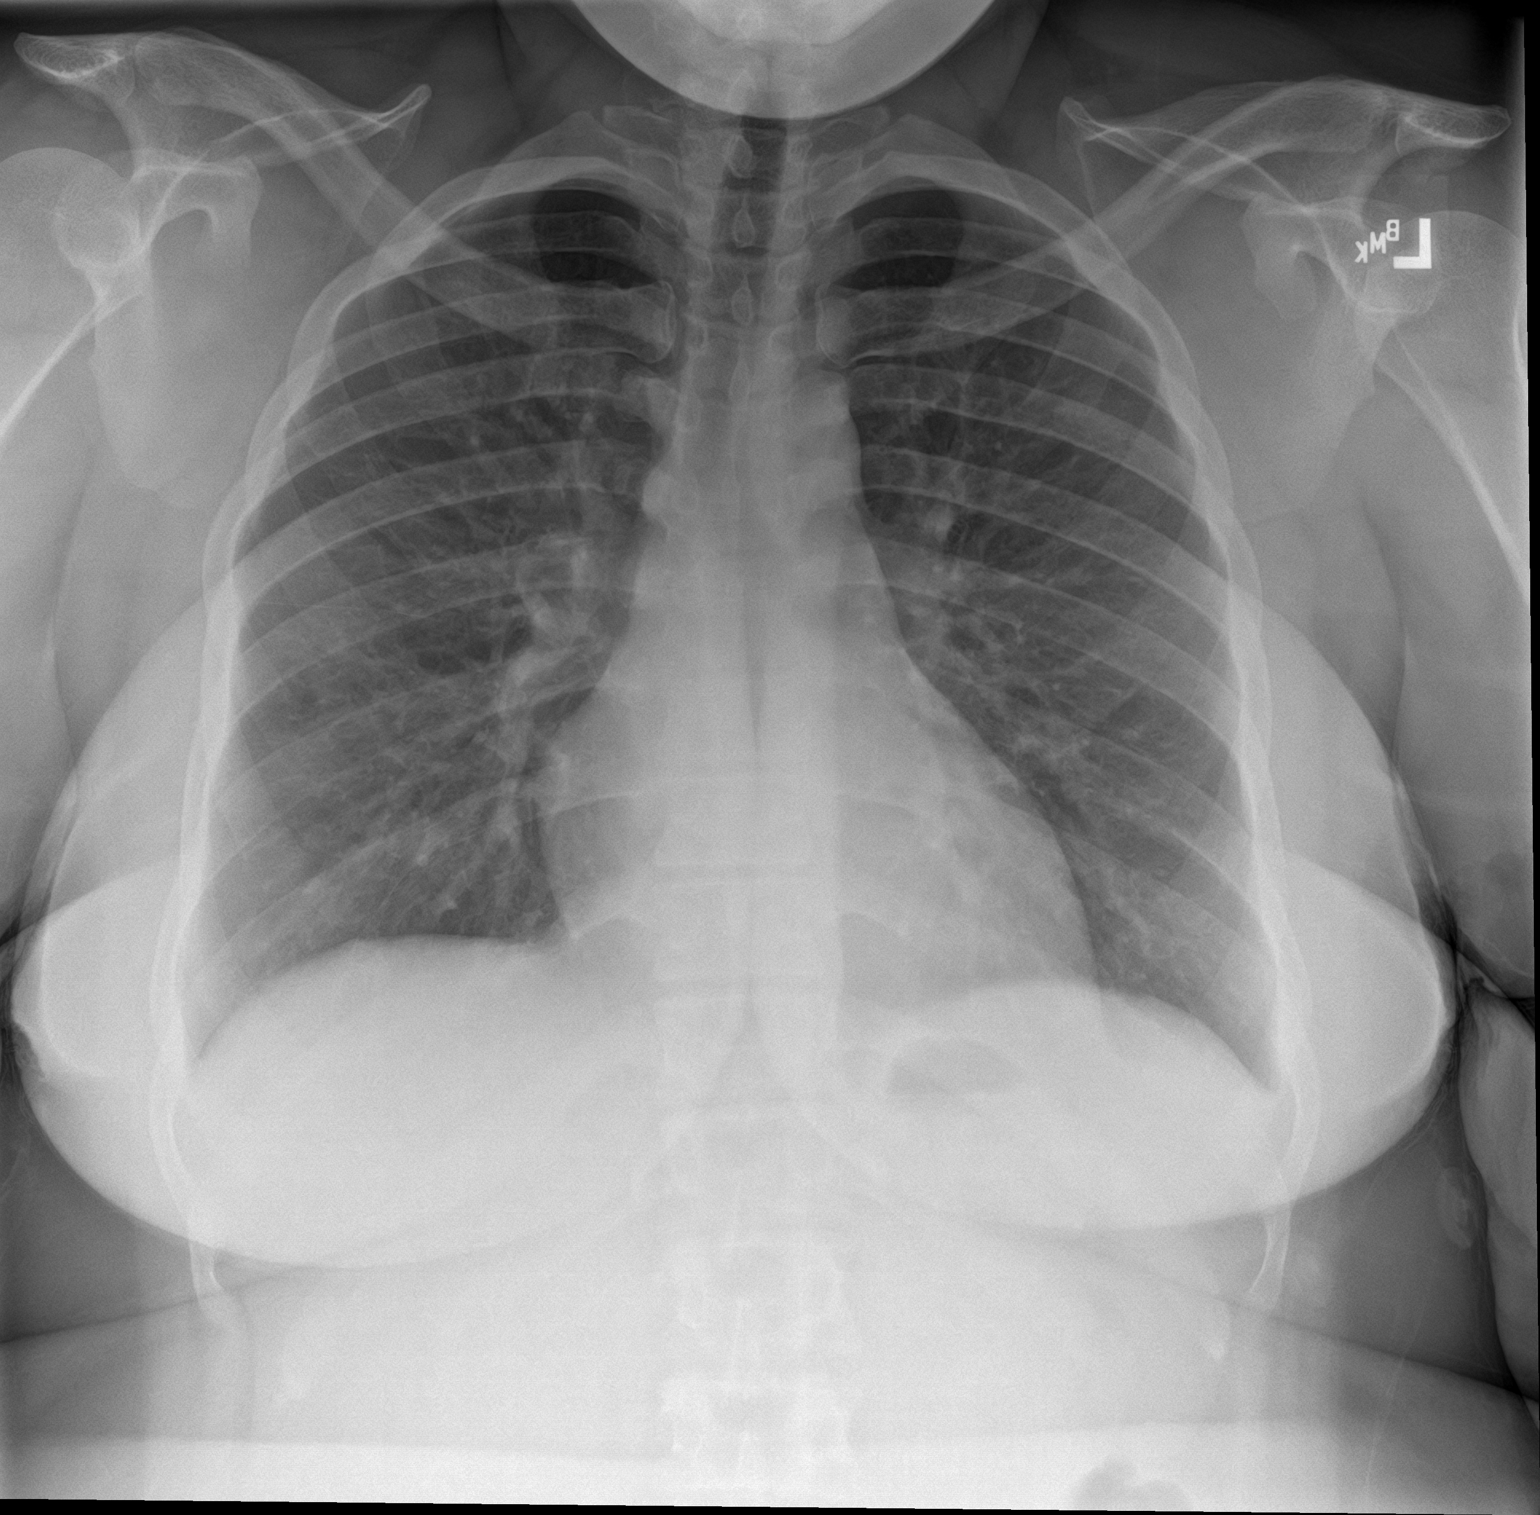

[chest lat]
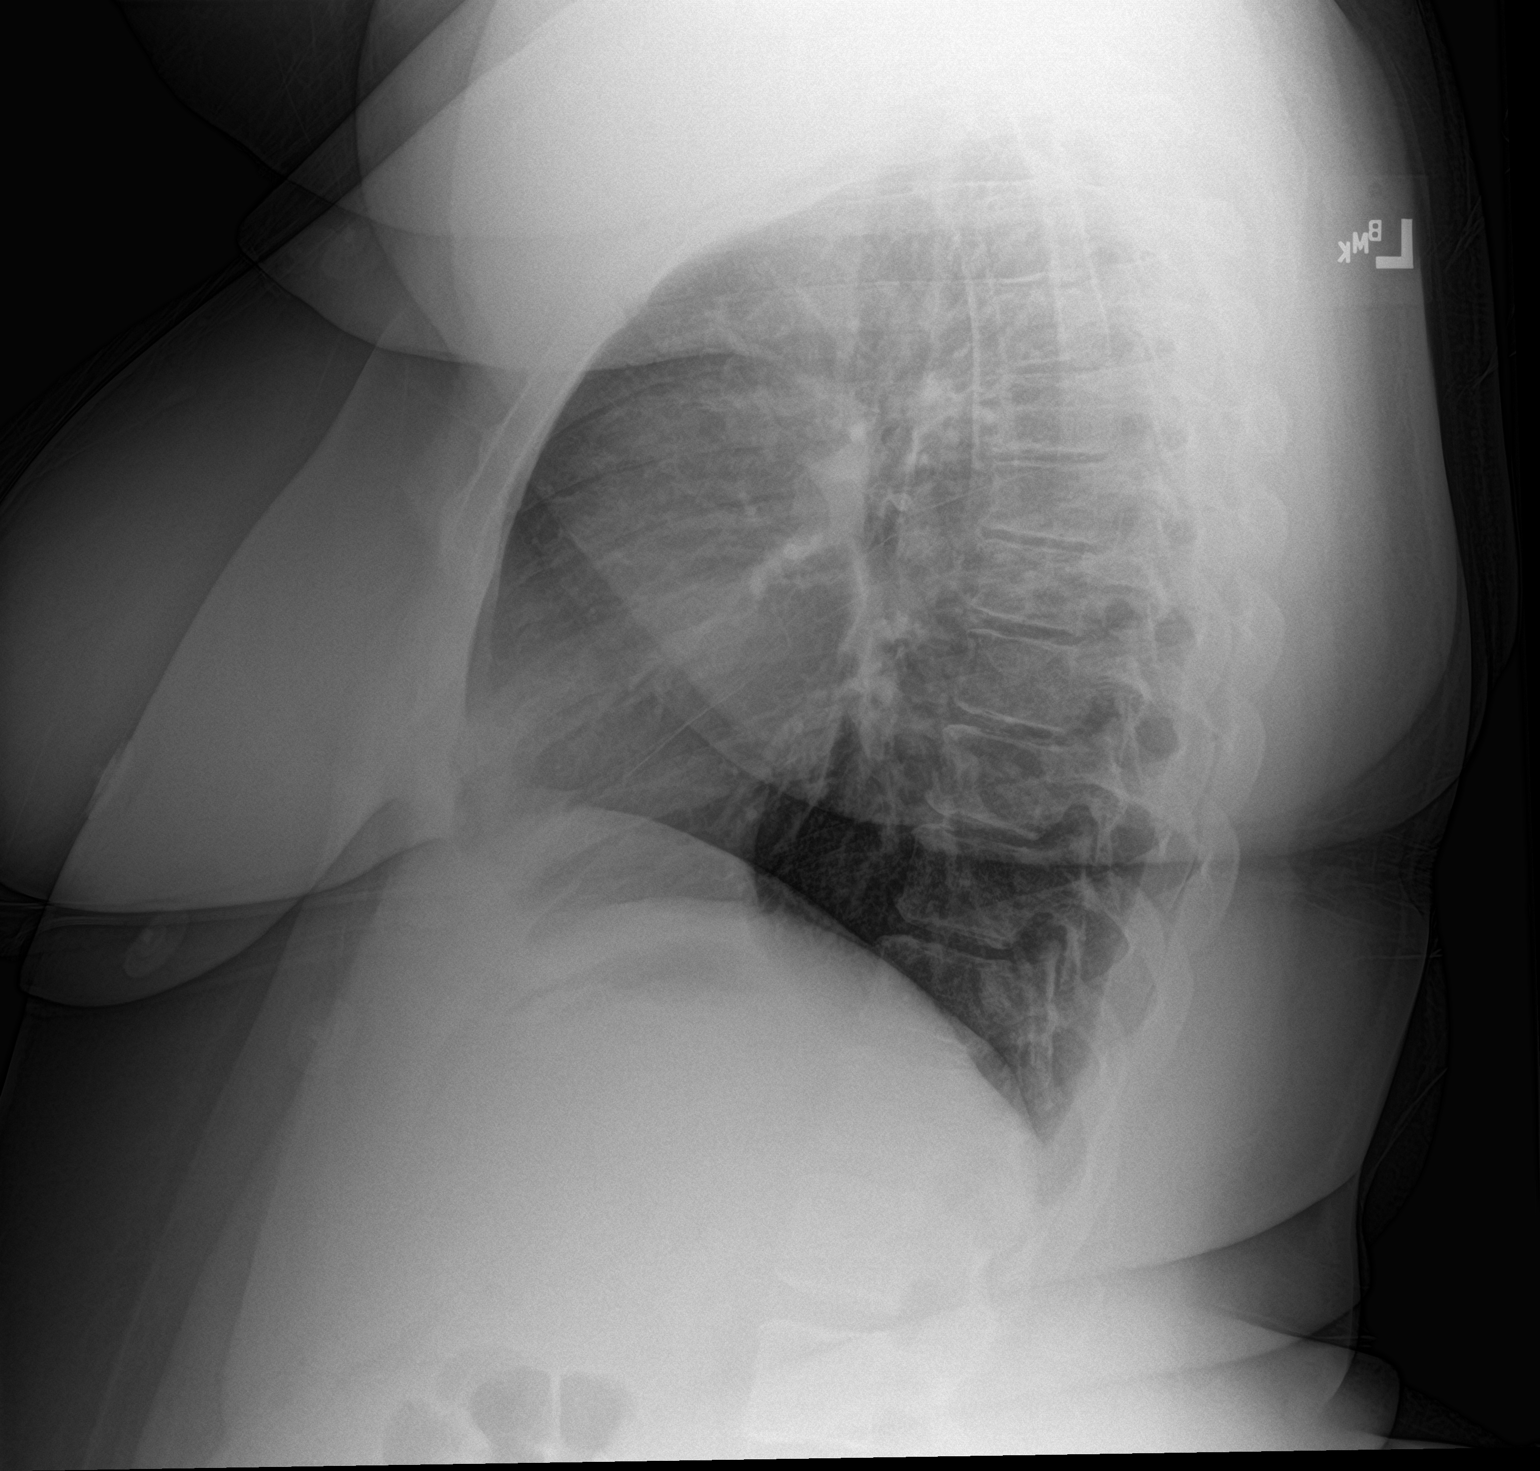

[2 of 2 positions shown; findings below may reference images not displayed]

FINDINGS: The heart size and mediastinal contours are within normal limits.
Both lungs are clear. The visualized skeletal structures are
unremarkable.
IMPRESSION: No active cardiopulmonary disease.

## 2021-09-27 ENCOUNTER — Ambulatory Visit: Payer: Medicaid Other

## 2021-09-27 DIAGNOSIS — N926 Irregular menstruation, unspecified: Secondary | ICD-10-CM

## 2021-09-27 DIAGNOSIS — Z30431 Encounter for routine checking of intrauterine contraceptive device: Secondary | ICD-10-CM

## 2021-10-04 ENCOUNTER — Other Ambulatory Visit: Payer: Self-pay

## 2021-10-04 ENCOUNTER — Ambulatory Visit
Admission: RE | Admit: 2021-10-04 | Discharge: 2021-10-04 | Disposition: A | Discharge status: Home / Self Care | Payer: Medicaid Other | Source: Ambulatory Visit | Attending: Family Medicine | Admitting: Family Medicine

## 2021-10-04 ENCOUNTER — Ambulatory Visit: Payer: Medicaid Other

## 2021-10-04 ENCOUNTER — Other Ambulatory Visit (INDEPENDENT_AMBULATORY_CARE_PROVIDER_SITE_OTHER): Payer: Self-pay | Admitting: Physician Assistant

## 2021-10-04 DIAGNOSIS — N6489 Other specified disorders of breast: Secondary | ICD-10-CM

## 2021-10-07 ENCOUNTER — Other Ambulatory Visit: Payer: Self-pay | Admitting: Physician Assistant

## 2021-10-07 MED ORDER — IBUPROFEN 800 MG PO TABS: 800.0000 mg | ORAL_TABLET | Freq: Three times a day (TID) | ORAL | 0 refills | Status: DC | PRN

## 2021-11-27 ENCOUNTER — Emergency Department
Admission: EM | Admit: 2021-11-27 | Discharge: 2021-11-27 | Disposition: A | Discharge status: Home / Self Care | Payer: Medicaid Other | Attending: Emergency Medicine | Admitting: Emergency Medicine

## 2021-11-27 ENCOUNTER — Encounter: Payer: Self-pay | Admitting: Emergency Medicine

## 2021-11-27 DIAGNOSIS — J029 Acute pharyngitis, unspecified: Secondary | ICD-10-CM | POA: Diagnosis present

## 2021-11-27 DIAGNOSIS — Z20822 Contact with and (suspected) exposure to covid-19: Secondary | ICD-10-CM | POA: Diagnosis not present

## 2021-11-27 DIAGNOSIS — J020 Streptococcal pharyngitis: Secondary | ICD-10-CM | POA: Diagnosis not present

## 2021-11-27 LAB — RESP PANEL BY RT-PCR (FLU A&B, COVID) ARPGX2
Influenza A by PCR: NEGATIVE
Influenza B by PCR: NEGATIVE
SARS Coronavirus 2 by RT PCR: NEGATIVE

## 2021-11-27 LAB — GROUP A STREP BY PCR: Group A Strep by PCR: NOT DETECTED

## 2021-11-27 MED ORDER — IBUPROFEN 600 MG PO TABS
600.0000 mg | ORAL_TABLET | Freq: Once | ORAL | Status: AC
  Administered 2021-11-27: 600 mg via ORAL
  Filled 2021-11-27: qty 1

## 2021-11-27 MED ORDER — PREDNISONE 50 MG PO TABS: 50.0000 mg | ORAL_TABLET | Freq: Every day | ORAL | 0 refills | Status: DC

## 2021-11-27 MED ORDER — MAGIC MOUTHWASH W/LIDOCAINE: 5.0000 mL | Freq: Four times a day (QID) | ORAL | 0 refills | Status: DC

## 2021-11-27 MED ORDER — ACETAMINOPHEN 500 MG PO TABS
1000.0000 mg | ORAL_TABLET | Freq: Once | ORAL | Status: AC
  Administered 2021-11-27: 1000 mg via ORAL
  Filled 2021-11-27: qty 2

## 2021-11-27 MED ORDER — AMOXICILLIN 875 MG PO TABS: 875.0000 mg | ORAL_TABLET | Freq: Two times a day (BID) | ORAL | 0 refills | Status: DC

## 2021-11-27 NOTE — ED Provider Notes (Signed)
[facility] Provider Note  Patient Contact: 11:08 PM (approximate)   History   Fever   HPI  Diamond Cook is a 36 y.o. female who presents to the emergency department complaining of congestion, sore throat, body aches, fever.  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  Her coworker tested negative for COVID as did the patient.  Patient is having no cough, no shortness of breath, chest pain, abdominal pain at this time.  No urinary symptoms.     Physical Exam   Triage Vital Signs: ED Triage Vitals  Enc Vitals Group     BP 11/27/21 1939 (!) 142/91     Pulse Rate 11/27/21 1939 (!) 107     Resp 11/27/21 1939 (!) 21     Temp 11/27/21 1939 (!) 101.7 F (38.7 C)     Temp Source 11/27/21 1939 Oral     SpO2 11/27/21 1939 97 %     Weight 11/27/21 1937 (!) 326 lb (147.9 kg)     Height 11/27/21 1937 5\' 7"  (1.702 m)     Head Circumference --      Peak Flow --      Pain Score --      Pain Loc --      Pain Edu? --      Excl. in GC? --     Most recent vital signs: Vitals:   11/27/21 1939 11/27/21 2224  BP: (!) 142/91   Pulse: (!) 107   Resp: (!) 21   Temp: (!) 101.7 F (38.7 C) 99.6 F (37.6 C)  SpO2: 97%      General: Alert and in no acute distress ENT:      Ears:       Nose: No congestion/rhinnorhea.      Mouth/Throat: Mucous membranes are moist.  Tonsils are erythematous and edematous bilaterally.  Equal tonsillar hypertrophy.  Exudates present. Neck: No stridor. No cervical spine tenderness to palpation.  No erythema or edema of the anterior neck.  No tenderness to palpation along the anterior neck. Hematological/Lymphatic/Immunilogical: Scattered anterior cervical lymphadenopathy. Cardiovascular:  Good peripheral perfusion Respiratory: Normal respiratory effort without tachypnea or retractions. Lungs CTAB. Good air entry to the bases with no decreased or absent breath sounds. Gastrointestinal: Bowel  sounds 4 quadrants. Soft and nontender to palpation. No guarding or rigidity. No palpable masses. No distention. Musculoskeletal: Full range of motion to all extremities.  Neurologic:  No gross focal neurologic deficits are appreciated.  Skin:   No rash noted Other:   ED Results / Procedures / Treatments   Labs (all labs ordered are listed, but only abnormal results are displayed) Labs Reviewed  RESP PANEL BY RT-PCR (FLU A&B, COVID) [id number]  GROUP A STREP BY PCR     EKG     RADIOLOGY    No results found.  PROCEDURES:  Critical Care performed: No  Procedures   MEDICATIONS ORDERED IN ED: Medications  acetaminophen (TYLENOL) tablet 1,000 mg (1,000 mg Oral Given 11/27/21 1943)  ibuprofen (ADVIL) tablet 600 mg (600 mg Oral Given 11/27/21 2222)     IMPRESSION / MDM / ASSESSMENT AND PLAN / ED COURSE  I reviewed the triage vital signs and the nursing notes.                              Differential diagnosis includes, but is not limited to, strep  pharyngitis, COVID, influenza, viral illness   Patient's diagnosis is consistent with strep pharyngitis, possible viral illness.  Patient presented to the emergency department after developing fever, congestion, sore throat, body aches.  Has been exposed to someone at work with similar symptoms.  Tested negative for COVID at home as well as negative for COVID and flu here.  Patient also tested negative for strep here but does meet 4 out of 5 [facility] criteria.  Since patient does meet criteria and I do expect that the patient would have group C or group G strep which is not tested for on our swab, I will treat empirically for strep with antibiotics.  Given the tonsillar hypertrophy I will also treat for steroids to ensure no airway compromise.  Magic mouthwash for symptom relief.  Tylenol and Motrin at home.  No indication for further work-up as I am not concerned for peritonsillar abscess given the presentation..  Return precautions  discussed with the patient.  Patient is given ED precautions to return to the ED for any worsening or new symptoms.        FINAL CLINICAL IMPRESSION(S) / ED DIAGNOSES   Final diagnoses:  Strep pharyngitis     Rx / DC Orders   ED Discharge Orders          Ordered    amoxicillin (AMOXIL) 875 MG tablet  2 times daily        11/27/21 2304    predniSONE (DELTASONE) 50 MG tablet  Daily with breakfast        11/27/21 2304    magic mouthwash w/lidocaine SOLN  4 times daily       Note to Pharmacy: Dispense in a 1/1/1 ratio. Use lidocaine, diphenhydramine, prednisolone   11/27/21 2304             Note:  This document was prepared using Dragon voice recognition software and may include unintentional dictation errors.   Lanette Hampshire 11/27/21 2311    Chesley Noon, MD 11/27/21 [phone]

## 2021-11-27 NOTE — ED Triage Notes (Signed)
Pt c/o fever, nasal congestion and sore throat since waking this AM. Pt reports she only took 1g Tylenol this AM. Pt to ED due to concern for fever. 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

## 2022-04-10 ENCOUNTER — Other Ambulatory Visit: Payer: Medicaid Other

## 2022-04-10 ENCOUNTER — Emergency Department: Payer: Medicaid Other

## 2022-04-10 ENCOUNTER — Emergency Department
Admission: EM | Admit: 2022-04-10 | Discharge: 2022-04-10 | Disposition: A | Discharge status: Home / Self Care | Payer: Medicaid Other | Attending: Emergency Medicine | Admitting: Emergency Medicine

## 2022-04-10 ENCOUNTER — Ambulatory Visit: Payer: Medicaid Other

## 2022-04-10 ENCOUNTER — Other Ambulatory Visit: Payer: Self-pay

## 2022-04-10 DIAGNOSIS — Z79899 Other long term (current) drug therapy: Secondary | ICD-10-CM | POA: Diagnosis not present

## 2022-04-10 DIAGNOSIS — R079 Chest pain, unspecified: Secondary | ICD-10-CM | POA: Diagnosis present

## 2022-04-10 DIAGNOSIS — I10 Essential (primary) hypertension: Secondary | ICD-10-CM | POA: Insufficient documentation

## 2022-04-10 LAB — BASIC METABOLIC PANEL
Anion gap: 5 (ref 5–15)
BUN: 16 mg/dL (ref 6–20)
CO2: 27 mmol/L (ref 22–32)
Calcium: 9.1 mg/dL (ref 8.9–10.3)
Chloride: 107 mmol/L (ref 98–111)
Creatinine, Ser: 0.76 mg/dL (ref 0.44–1.00)
GFR, Estimated: >60 mL/min (ref >60)
Glucose, Bld: 93 mg/dL (ref 70–99)
Potassium: 3.7 mmol/L (ref 3.5–5.1)
Sodium: 139 mmol/L (ref 135–145)

## 2022-04-10 LAB — CBC
HCT: 34.9 % — ABNORMAL LOW (ref 36.0–46.0)
Hemoglobin: 10.7 g/dL — ABNORMAL LOW (ref 12.0–15.0)
MCH: 24.5 pg — ABNORMAL LOW (ref 26.0–34.0)
MCHC: 30.7 g/dL (ref 30.0–36.0)
MCV: 79.9 fL — ABNORMAL LOW (ref 80.0–100.0)
Platelets: 227 10*3/uL (ref 150–400)
RBC: 4.37 MIL/uL (ref 3.87–5.11)
RDW: 14.9 % (ref 11.5–15.5)
WBC: 4.8 10*3/uL (ref 4.0–10.5)
nRBC: 0 % (ref 0.0–0.2)

## 2022-04-10 LAB — TROPONIN I (HIGH SENSITIVITY)
Troponin I (High Sensitivity): 3 ng/L (ref <18)
Troponin I (High Sensitivity): 3 ng/L (ref <18)

## 2022-04-10 MED ORDER — LIDOCAINE VISCOUS HCL 2 % MT SOLN
15.0000 mL | Freq: Once | OROMUCOSAL | Status: AC
  Administered 2022-04-10: 15 mL via ORAL
  Filled 2022-04-10: qty 15

## 2022-04-10 MED ORDER — FAMOTIDINE 20 MG PO TABS
40.0000 mg | ORAL_TABLET | Freq: Once | ORAL | Status: AC
  Administered 2022-04-10: 40 mg via ORAL
  Filled 2022-04-10: qty 2

## 2022-04-10 MED ORDER — ALUM & MAG HYDROXIDE-SIMETH 200-200-20 MG/5ML PO SUSP
30.0000 mL | Freq: Once | ORAL | Status: AC
  Administered 2022-04-10: 30 mL via ORAL
  Filled 2022-04-10: qty 30

## 2022-04-10 NOTE — ED Provider Notes (Signed)
[facility] Provider Note  Patient Contact: 4:21 PM (approximate)   History   Chest Pain   HPI  Diamond Cook is a 36 y.o. female with a history of hypertension, obesity and GERD presents to the emergency department with central chest pain that started 2 hours before presenting to the emergency department.  Patient reports that her chest pain is currently resolved.  She denies associated shortness of breath or chest tightness.  Denies a history of similar chest pain in the past.  She is an established patient with Dr. Darrold Junker and takes metoprolol twice daily for hypertension.  She denies a family history of cardiac.  Denies daily smoking mobilization.  No associated.  States that her symptoms started after orange juice consumption.      Physical Exam   Triage Vital Signs: ED Triage Vitals  Enc Vitals Group     BP 04/10/22 1539 (!) 142/96     Pulse Rate 04/10/22 1539 81     Resp 04/10/22 1539 18     Temp 04/10/22 1539 98.8 F (37.1 C)     Temp src --      SpO2 04/10/22 1539 97 %     Weight 04/10/22 1532 (!) 326 lb (147.9 kg)     Height 04/10/22 1532 5\' 7"  (1.702 m)     Head Circumference --      Peak Flow --      Pain Score 04/10/22 1532 3     Pain Loc --      Pain Edu? --      Excl. in [place]? --     Most recent vital signs: Vitals:   04/10/22 1539 04/10/22 1844  BP: (!) 142/96 138/88  Pulse: 81 78  Resp: 18 18  Temp: 98.8 F (37.1 C)   SpO2: 97% 97%     General: Alert and in no acute distress. Eyes:  PERRL. EOMI. Head: No acute traumatic findings ENT:      Ears:       Nose: No congestion/rhinnorhea.      Mouth/Throat: Mucous membranes are moist. Neck: No stridor. No cervical spine tenderness to palpation. Cardiovascular:  Good peripheral perfusion Respiratory: Normal respiratory effort without tachypnea or retractions. Lungs CTAB. Good air entry to the bases with no decreased or absent breath sounds. Gastrointestinal: Bowel sounds 4  quadrants. Soft and nontender to palpation. No guarding or rigidity. No palpable masses. No distention. No CVA tenderness. Musculoskeletal: Full range of motion to all extremities.  Neurologic:  No gross focal neurologic deficits are appreciated.  Skin:   No rash noted Other:   ED Results / Procedures / Treatments   Labs (all labs ordered are listed, but only abnormal results are displayed) Labs Reviewed  CBC - Abnormal; Notable for the following components:      Result Value   Hemoglobin 10.7 (*)    HCT 34.9 (*)    MCV 79.9 (*)    MCH 24.5 (*)    All other components within normal limits  BASIC METABOLIC PANEL  POC URINE PREG, ED  TROPONIN I (HIGH SENSITIVITY)  TROPONIN I (HIGH SENSITIVITY)     EKG  Normal sinus rhythm without ST segment elevation or other apparent arrhythmia   RADIOLOGY  I personally viewed and evaluated these images as part of my medical decision making, as well as reviewing the written report by the radiologist.  ED Provider Interpretation: I personally interpreted chest x-ray and agree with radiologist interpretation.  No  acute abnormality.   PROCEDURES:  Critical Care performed: No  Procedures   MEDICATIONS ORDERED IN ED: Medications  famotidine (PEPCID) tablet 40 mg (40 mg Oral Given 04/10/22 1624)  alum & mag hydroxide-simeth (MAALOX/MYLANTA) 200-200-20 MG/5ML suspension 30 mL (30 mLs Oral Given 04/10/22 1624)    And  lidocaine (XYLOCAINE) 2 % viscous mouth solution 15 mL (15 mLs Oral Given 04/10/22 1624)     IMPRESSION / MDM / ASSESSMENT AND PLAN / ED COURSE  I reviewed the triage vital signs and the nursing notes.                              Assessment and plan:  Chest pain 36 year old female presents to the emergency department with resolved chest pain that was midsternal in nature that started 2 hours prior to presenting to the emergency department with no exertional worsening.  Vital signs were reassuring at triage.  On exam,  patient was alert, active and nontoxic-appearing.  She had no increased work of breathing and no adventitious lung sounds.  EKG indicated normal sinus rhythm without ST segment elevation or other apparent arrhythmia.  Delta troponin within range.  CBC and BMP unremarkable.  Chest x-ray shows no acute abnormality.  Patient did state that her chest pain started after consuming orange juice and a GI cocktail was given in the emergency department.  Upon recheck, patient remained asymptomatic.  Patient sees Dr. Darrold Junker and I did recommend following up with him and continuing to take omeprazole at home.  Return precautions were given to return to the emergency department with new or worsening symptoms.  All patient questions were answered.   FINAL CLINICAL IMPRESSION(S) / ED DIAGNOSES   Final diagnoses:  Nonspecific chest pain     Rx / DC Orders   ED Discharge Orders     None        Note:  This document was prepared using Dragon voice recognition software and may include unintentional dictation errors.   Pia Mau [place], PA-C 04/10/22 1956    Merwyn Katos, MD 04/13/22 [phone]

## 2022-04-10 NOTE — Discharge Instructions (Signed)
Please continue taking your omeprazole. Please make follow up appointment with Dr. Darrold Junker.

## 2022-04-10 NOTE — ED Triage Notes (Signed)
Patient to ER via POV with complaints of centralized, non-radiating chest pain that started approx 2 hours ago, describes it as an "intermittent aching pain". Denies cardiac history. Denies SHOB.

## 2022-05-08 ENCOUNTER — Ambulatory Visit: Payer: Medicaid Other

## 2022-10-08 DIAGNOSIS — Z6841 Body Mass Index (BMI) 40.0 and over, adult: Secondary | ICD-10-CM | POA: Diagnosis not present

## 2022-10-08 DIAGNOSIS — I10 Essential (primary) hypertension: Secondary | ICD-10-CM | POA: Diagnosis not present

## 2022-10-08 DIAGNOSIS — F509 Eating disorder, unspecified: Secondary | ICD-10-CM | POA: Diagnosis not present

## 2022-10-08 DIAGNOSIS — Z1331 Encounter for screening for depression: Secondary | ICD-10-CM | POA: Diagnosis not present

## 2022-10-26 ENCOUNTER — Other Ambulatory Visit: Payer: Self-pay

## 2022-10-26 ENCOUNTER — Emergency Department
Admission: EM | Admit: 2022-10-26 | Discharge: 2022-10-26 | Disposition: A | Discharge status: Home / Self Care | Payer: BLUE CROSS/BLUE SHIELD | Attending: Emergency Medicine | Admitting: Emergency Medicine

## 2022-10-26 ENCOUNTER — Encounter: Payer: Self-pay | Admitting: Emergency Medicine

## 2022-10-26 DIAGNOSIS — I10 Essential (primary) hypertension: Secondary | ICD-10-CM | POA: Diagnosis not present

## 2022-10-26 DIAGNOSIS — N760 Acute vaginitis: Secondary | ICD-10-CM | POA: Insufficient documentation

## 2022-10-26 DIAGNOSIS — B9689 Other specified bacterial agents as the cause of diseases classified elsewhere: Secondary | ICD-10-CM

## 2022-10-26 DIAGNOSIS — R21 Rash and other nonspecific skin eruption: Secondary | ICD-10-CM | POA: Diagnosis not present

## 2022-10-26 MED ORDER — CETIRIZINE HCL 10 MG PO TABS: 10.0000 mg | ORAL_TABLET | Freq: Every day | ORAL | 0 refills | Status: DC

## 2022-10-26 MED ORDER — METRONIDAZOLE 0.75 % VA GEL: 1.0000 | Freq: Every day | VAGINAL | 0 refills | Status: AC

## 2022-10-26 NOTE — Discharge Instructions (Signed)
Please stop using the washcloths as this may be contributing to your symptoms.  You can stop the metronidazole pills and use the vaginal gel instead.  Please return for any new, worsening, or change in symptoms or other concerns or if you develop tongue or lip swelling or trouble breathing or swallowing.

## 2022-10-26 NOTE — ED Triage Notes (Signed)
Pt sts that she has started to have a rash on her face. Pt is taking a combinations of meds for H pylori. Pt sts that she is taking clindamycin, amoxicillin and metronidazole for BV. Pt sts that she has taken these meds before but doesn't know what is causing the rash.

## 2022-10-26 NOTE — ED Provider Notes (Signed)
[facility] Provider Note    Event Date/Time   First MD Initiated Contact with Patient 10/26/22 1315     (approximate)   History   Rash   HPI  Diamond Cook is a 36 y.o. female who presents today for evaluation of rash.  She reports that she is taking medications for both bacterial vaginosis as well as for H. pylori, and all of the medicine she has taken before.  However, patient reports that she has never taking her H. pylori medications with the metronidazole, and she wishes to stop the metronidazole.  She reports that the rash is only to her face into her neck.  She also notes that she recently started new washcloths to her face.  She denies any trouble breathing or swallowing.  No tongue or lip swelling.  No rash to the rest of her body.  Patient Active Problem List   Diagnosis Date Noted   BV (bacterial vaginosis) 09/10/2021   Menorrhagia with irregular cycle 12/17/2017   Morbid obesity ([place]) 12/17/2017   Iron deficiency anemia 12/17/2017   Benign essential hypertension 12/17/2017          Physical Exam   Triage Vital Signs: ED Triage Vitals  Enc Vitals Group     BP 10/26/22 1258 (!) 133/90     Pulse Rate 10/26/22 1258 78     Resp 10/26/22 1258 17     Temp 10/26/22 1258 98.7 F (37.1 C)     Temp Source 10/26/22 1258 Oral     SpO2 10/26/22 1258 98 %     Weight 10/26/22 1259 (!) 326 lb (147.9 kg)     Height --      Head Circumference --      Peak Flow --      Pain Score --      Pain Loc --      Pain Edu? --      Excl. in [place]? --     Most recent vital signs: Vitals:   10/26/22 1258  BP: (!) 133/90  Pulse: 78  Resp: 17  Temp: 98.7 F (37.1 C)  SpO2: 98%    Physical Exam Vitals and nursing note reviewed.  Constitutional:      General: Awake and alert. No acute distress.    Appearance: Normal appearance. The patient is normal weight.  HENT:     Head: Normocephalic and atraumatic.     Mouth: Mucous membranes are moist.  Eyes:      General: PERRL. Normal EOMs        Right eye: No discharge.        Left eye: No discharge.     Conjunctiva/sclera: Conjunctivae normal.  Cardiovascular:     Rate and Rhythm: Normal rate and regular rhythm.     Pulses: Normal pulses.     Heart sounds: Normal heart sounds Pulmonary:     Effort: Pulmonary effort is normal. No respiratory distress.     Breath sounds: Normal breath sounds.  No wheezing Abdominal:     Abdomen is soft. There is no abdominal tenderness. No rebound or guarding. No distention. Musculoskeletal:        General: No swelling. Normal range of motion.     Cervical back: Normal range of motion and neck supple.  Skin:    General: Skin is warm and dry.     Capillary Refill: Capillary refill takes less than 2 seconds.     Findings: Faint papular rash noted to  left cheek, also involving right cheek though less so.  Also involves left side of neck with excoriation.  No vesicular lesions.  No pustules.  No involvement of trunk, extremities, back, or belly.  No tongue or lip swelling Neurological:     Mental Status: The patient is awake and alert.      ED Results / Procedures / Treatments   Labs (all labs ordered are listed, but only abnormal results are displayed) Labs Reviewed - No data to display   EKG     RADIOLOGY     PROCEDURES:  Critical Care performed:   Procedures   MEDICATIONS ORDERED IN ED: Medications - No data to display   IMPRESSION / MDM / ASSESSMENT AND PLAN / ED COURSE  I reviewed the triage vital signs and the nursing notes.   Differential diagnosis includes, but is not limited to, allergic reaction, contact dermatitis, less likely cellulitis.  Patient is awake and alert, hemodynamically stable and afebrile.  She demonstrates no acute work of breathing.  There is no tremor or limb swelling to suggest angioedema.  No wheezing, nausea, vomiting, diarrhea, or full body urticaria to suggest anaphylaxis.  Patient wishes to change  her metronidazole pills to intravaginal gel which was done for her.  She does report that she has been using new facial wipes on her face, recommend that she stop using these given that this is likely the culprit for her contact dermatitis.  She was given a prescription for cetirizine to help with this problem as well.  We discussed return precautions and outpatient management.  Patient understands and agrees with plan.  She was discharged in stable condition.   Patient's presentation is most consistent with acute illness / injury with system symptoms.       FINAL CLINICAL IMPRESSION(S) / ED DIAGNOSES   Final diagnoses:  Rash and nonspecific skin eruption  BV (bacterial vaginosis)     Rx / DC Orders   ED Discharge Orders          Ordered    metroNIDAZOLE (METROGEL) 0.75 % vaginal gel  Daily at bedtime        10/26/22 1324    cetirizine (ZYRTEC ALLERGY) 10 MG tablet  Daily        10/26/22 1324             Note:  This document was prepared using Dragon voice recognition software and may include unintentional dictation errors.   Jackelyn Hoehn, PA-C 10/26/22 1459    Jene Every, MD 10/26/22 [phone]

## 2022-10-29 DIAGNOSIS — L259 Unspecified contact dermatitis, unspecified cause: Secondary | ICD-10-CM | POA: Diagnosis not present

## 2022-12-12 DIAGNOSIS — E049 Nontoxic goiter, unspecified: Secondary | ICD-10-CM | POA: Diagnosis not present

## 2022-12-12 DIAGNOSIS — D509 Iron deficiency anemia, unspecified: Secondary | ICD-10-CM | POA: Diagnosis not present

## 2022-12-25 DIAGNOSIS — R002 Palpitations: Secondary | ICD-10-CM | POA: Diagnosis not present

## 2022-12-25 DIAGNOSIS — I10 Essential (primary) hypertension: Secondary | ICD-10-CM | POA: Diagnosis not present

## 2022-12-25 DIAGNOSIS — Z23 Encounter for immunization: Secondary | ICD-10-CM | POA: Diagnosis not present

## 2023-01-02 DIAGNOSIS — R21 Rash and other nonspecific skin eruption: Secondary | ICD-10-CM | POA: Diagnosis not present

## 2023-01-09 DIAGNOSIS — E049 Nontoxic goiter, unspecified: Secondary | ICD-10-CM | POA: Diagnosis not present

## 2023-01-19 DIAGNOSIS — Z6841 Body Mass Index (BMI) 40.0 and over, adult: Secondary | ICD-10-CM | POA: Diagnosis not present

## 2023-02-20 DIAGNOSIS — M62838 Other muscle spasm: Secondary | ICD-10-CM | POA: Diagnosis not present

## 2023-03-23 DIAGNOSIS — R1084 Generalized abdominal pain: Secondary | ICD-10-CM | POA: Diagnosis not present

## 2023-03-23 DIAGNOSIS — A048 Other specified bacterial intestinal infections: Secondary | ICD-10-CM | POA: Diagnosis not present

## 2023-05-18 ENCOUNTER — Ambulatory Visit: Payer: BLUE CROSS/BLUE SHIELD | Admitting: Dietician

## 2023-06-02 ENCOUNTER — Encounter: Payer: BC Managed Care – PPO | Attending: Nurse Practitioner | Admitting: Dietician

## 2023-06-02 DIAGNOSIS — Z6841 Body Mass Index (BMI) 40.0 and over, adult: Secondary | ICD-10-CM | POA: Insufficient documentation

## 2023-06-02 DIAGNOSIS — Z713 Dietary counseling and surveillance: Secondary | ICD-10-CM | POA: Diagnosis not present

## 2023-06-02 NOTE — Patient Instructions (Addendum)
Look for healthy weight loss of 1-2 pounds per week!  Use a combination of these three strategies to effectively lower your consumption of energy dense foods 1) Consume a smaller portion when having energy dense foods 2) Consume energy dense foods less frequently 3) Find a reduced or low calorie version  When you get off of work, use your dumbbells to do some upper body seated exercises. Try to get about 10 - 15 minutes. Take a walk on the weekend to the point where you feel like you have exerted yourself thoroughly.  Remember the difference between energy dense and nutrient dense foods!

## 2023-06-02 NOTE — Progress Notes (Unsigned)
Medical Nutrition Therapy  Appointment Start time:  [phone]  Appointment End time:  1700  Primary concerns today: Weight Loss  Referral diagnosis: E66.9 - Obesity Preferred learning style:No preference indicated Learning readiness: Ready   NUTRITION ASSESSMENT   Anthropometrics  Ht: 5'7" Wt: 339.9 lbs BMI: 53.24 kg/m2 Goal Weight: 165 lbs   Clinical Medical Hx: HTN, Anemia, Obesity, H. Pylori, Medications: Metoprolol, Pantoprazole Labs: N/A Notable Signs/Symptoms: N/A   Lifestyle & Dietary Hx Pt reports desire to lose weight, states it is causing depression, pt reports hiding at home over concerns of how their body looks. Pt reports history of attempted weight loss, but gets frustrated and rebounds, tried keto recently with little success. Pt reports trying to get [facility] for weight loss, but was unable to get insurance coverage. Pt reports usually eating 2 meals a day, mostly misses lunch because of being busy at work. Pt reports eating away from home on most work days, states it is due to convenience. Pt reports recent intolerance to dairy, spicy food, causes reflux/heart palpitations. Pt reports history of H. Pylori, recently resolved (03/23/2023). Pt reports working all day on their feet and that their feet are sore when they get home, states that prevents them from exercising, states they have the motivation to but physically unable to because of foot pain.   Estimated daily fluid intake: >48 oz Supplements: N/A Sleep: Sleeps well Stress / self-care: 7/10, Work/kids Current average weekly physical activity: Minimal, on feet at work   24-Hr Dietary Recall First Meal: Physiological scientist, Pensions consultant Snack: none Second Meal: Grilled [employer] club, fries, vanilla shake, chocolate chip cookie Snack: none Third Meal: Pork and cabbage Egg Rolls, sauce, orange fanta Snack: none Beverages: [employer], orange fanta, milkshake   NUTRITION DIAGNOSIS calorie NB-1.1 Food and  nutrition-related knowledge deficit As related to obesity.  As evidenced by BMI of 53.24 kg/m2, dietary recall high in energy dense foods and fast foods.   NUTRITION INTERVENTION  Nutrition education (E-1) on the following topics:  Educated patient on the two components of energy balance: Energy in (calories), and energy out (activity). Explain the role of negative energy balance in weight loss. Discussed options with patient to achieve a negative energy balance and how to best control energy in and energy out to accommodate their lifestyle.   Handouts Provided Include  N/A  Learning Style & Readiness for Change Teaching method utilized: Visual & Auditory  Demonstrated degree of understanding via: Teach Back  Barriers to learning/adherence to lifestyle change: Easily frustrated  Goals Established by Pt Look for healthy weight loss of 1-2 pounds per week! Use a combination of these three strategies to effectively lower your consumption of energy dense foods 1) Consume a smaller portion when having energy dense foods 2) Consume energy dense foods less frequently 3) Find a reduced or low calorie version When you get off of work, use your dumbbells to do some upper body seated exercises. Try to get about 10 - 15 minutes. Take a walk on the weekend to the point where you feel like you have exerted yourself thoroughly. Remember the difference between energy dense and nutrient dense foods!   MONITORING & EVALUATION Dietary intake, weekly physical activity, and weight loss in 6 weeks.  Next Steps  Patient is to follow up with RD.

## 2023-06-03 ENCOUNTER — Encounter: Payer: Self-pay | Admitting: Dietician

## 2023-07-02 DIAGNOSIS — D509 Iron deficiency anemia, unspecified: Secondary | ICD-10-CM | POA: Diagnosis not present

## 2023-07-02 DIAGNOSIS — I10 Essential (primary) hypertension: Secondary | ICD-10-CM | POA: Diagnosis not present

## 2023-07-02 DIAGNOSIS — F419 Anxiety disorder, unspecified: Secondary | ICD-10-CM | POA: Diagnosis not present

## 2023-07-24 ENCOUNTER — Encounter: Payer: Self-pay | Admitting: Emergency Medicine

## 2023-07-24 ENCOUNTER — Other Ambulatory Visit: Payer: Self-pay

## 2023-07-24 ENCOUNTER — Emergency Department
Admission: EM | Admit: 2023-07-24 | Discharge: 2023-07-24 | Disposition: A | Discharge status: Home / Self Care | Payer: Self-pay | Attending: Emergency Medicine | Admitting: Emergency Medicine

## 2023-07-24 DIAGNOSIS — R220 Localized swelling, mass and lump, head: Secondary | ICD-10-CM

## 2023-07-24 DIAGNOSIS — I10 Essential (primary) hypertension: Secondary | ICD-10-CM | POA: Insufficient documentation

## 2023-07-24 DIAGNOSIS — K130 Diseases of lips: Secondary | ICD-10-CM | POA: Diagnosis not present

## 2023-07-24 NOTE — ED Notes (Signed)
See triage note  Presents with some numbness to left side of lower lip  States she woke up with like this   No swelling noted

## 2023-07-24 NOTE — ED Provider Notes (Signed)
[facility] Provider Note    Event Date/Time   First MD Initiated Contact with Patient 07/24/23 0830     (approximate)   History   Facial Swelling   HPI  Diamond Cook is a 37 y.o. female with PMH of anxiety, depression and hypertension presents to the ED for evaluation of lip swelling.  Patient states she first noticed it this morning and has not had any new exposures to medications, lotions or soaps.  Patient denies throat swelling, tongue swelling and difficulty breathing.       Physical Exam   Triage Vital Signs: ED Triage Vitals  Encounter Vitals Group     BP 07/24/23 0828 121/79     Systolic BP Percentile --      Diastolic BP Percentile --      Pulse Rate 07/24/23 0827 71     Resp 07/24/23 0827 16     Temp 07/24/23 0827 98.1 F (36.7 C)     Temp Source 07/24/23 0827 Oral     SpO2 07/24/23 0827 96 %     Weight 07/24/23 0827 (!) 339 lb 15.2 oz (154.2 kg)     Height 07/24/23 0827 5\' 7"  (1.702 m)     Head Circumference --      Peak Flow --      Pain Score 07/24/23 0827 0     Pain Loc --      Pain Education --      Exclude from Growth Chart --     Most recent vital signs: Vitals:   07/24/23 0827 07/24/23 0828  BP:  121/79  Pulse: 71   Resp: 16   Temp: 98.1 F (36.7 C)   SpO2: 96%     General: Awake, no distress.  CV:  Good peripheral perfusion.  RRR. Resp:  Normal effort.  CTAB.  Speaking in full sentences.  Tolerating secretions. Abd:  No distention.  Other:  PERRL.  Oral mucous membranes are moist, no pharyngeal erythema or tonsillar enlargement.  Lip does not appear to be noticeably swollen.   ED Results / Procedures / Treatments   Labs (all labs ordered are listed, but only abnormal results are displayed) Labs Reviewed - No data to display   PROCEDURES:  Critical Care performed: No  Procedures   MEDICATIONS ORDERED IN ED: Medications - No data to display   IMPRESSION / MDM / ASSESSMENT AND PLAN / ED COURSE  I  reviewed the triage vital signs and the nursing notes.                             37 year old female presents for evaluation of lip swelling.  VSS in triage patient NAD and nontoxic-appearing on exam.  Differential diagnosis includes, but is not limited to, allergic reaction, anaphylaxis, angioedema.  Patient's presentation is most consistent with acute, uncomplicated illness.  Patient is not showing any signs of anaphylaxis so I feel she stable for outpatient management.  I advised patient to take some allergy medication.  I gave her return precautions including difficulty breathing, tongue and throat swelling.  Patient voiced understanding, all questions were answered and she was stable at discharge.      FINAL CLINICAL IMPRESSION(S) / ED DIAGNOSES   Final diagnoses:  Lip swelling     Rx / DC Orders   ED Discharge Orders     None        Note:  This document  was prepared using Conservation officer, historic buildings and may include unintentional dictation errors.   Cameron Ali, PA-C 07/24/23 1914    Trinna Post, MD 07/24/23 [phone]

## 2023-07-24 NOTE — Discharge Instructions (Signed)
Please take a daily allergy medication.  Please return to the ED if you develop any tongue or throat swelling or difficulty breathing.

## 2023-07-24 NOTE — ED Triage Notes (Signed)
Pt here with lip swelling that started this morning. Pt denies  pain or throat swelling, speaking in complete sentences. Pt denies new foods, medicines, or detergents. Pt ambulatory to triage.

## 2023-07-27 NOTE — Group Note (Deleted)

## 2023-08-02 ENCOUNTER — Emergency Department
Admission: EM | Admit: 2023-08-02 | Discharge: 2023-08-02 | Disposition: A | Discharge status: Home / Self Care | Payer: BC Managed Care – PPO | Attending: Emergency Medicine | Admitting: Emergency Medicine

## 2023-08-02 ENCOUNTER — Other Ambulatory Visit: Payer: Self-pay

## 2023-08-02 DIAGNOSIS — J00 Acute nasopharyngitis [common cold]: Secondary | ICD-10-CM | POA: Insufficient documentation

## 2023-08-02 DIAGNOSIS — U071 COVID-19: Secondary | ICD-10-CM | POA: Diagnosis not present

## 2023-08-02 DIAGNOSIS — J029 Acute pharyngitis, unspecified: Secondary | ICD-10-CM | POA: Diagnosis not present

## 2023-08-02 DIAGNOSIS — I10 Essential (primary) hypertension: Secondary | ICD-10-CM | POA: Diagnosis not present

## 2023-08-02 LAB — RESP PANEL BY RT-PCR (RSV, FLU A&B, COVID)  RVPGX2
Influenza A by PCR: NEGATIVE
Influenza B by PCR: NEGATIVE
Resp Syncytial Virus by PCR: NEGATIVE
SARS Coronavirus 2 by RT PCR: POSITIVE — AB

## 2023-08-02 LAB — GROUP A STREP BY PCR: Group A Strep by PCR: NOT DETECTED

## 2023-08-02 MED ORDER — FLUTICASONE PROPIONATE 50 MCG/ACT NA SUSP: 2.0000 | Freq: Every day | NASAL | 0 refills | Status: AC

## 2023-08-02 NOTE — Discharge Instructions (Addendum)
Follow-up with your primary care provider if any continued problems.  Tylenol or ibuprofen if needed for fever, headache, body aches.  Increase fluids to stay hydrated.  Take over-the-counter medication as needed for symptoms of cough, congestion, and sore throat.

## 2023-08-02 NOTE — ED Triage Notes (Signed)
Pt now reporting L shoulder pain x 2 days. Denies known trauma or injury

## 2023-08-02 NOTE — ED Provider Notes (Signed)
[facility] Provider Note    Event Date/Time   First MD Initiated Contact with Patient 08/02/23 [phone]     (approximate)   History   flu symptoms   HPI  Diamond Cook is a 37 y.o. female   presents to the the ED with complaint of cold-like symptoms for approximately 3 to 4 days.  Patient states she began with sore throat, headache, fatigue and began with fever last evening.  She denies any nausea, vomiting or diarrhea.  She denies any difficulty breathing and is a non-smoker.  No known sick contacts.      Physical Exam   Triage Vital Signs: ED Triage Vitals  Encounter Vitals Group     BP 08/02/23 0452 132/87     Systolic BP Percentile --      Diastolic BP Percentile --      Pulse Rate 08/02/23 0452 70     Resp 08/02/23 0452 19     Temp 08/02/23 0454 98.5 F (36.9 C)     Temp Source 08/02/23 0452 Oral     SpO2 08/02/23 0452 99 %     Weight 08/02/23 0450 (!) 323 lb (146.5 kg)     Height 08/02/23 0450 5\' 7"  (1.702 m)     Head Circumference --      Peak Flow --      Pain Score 08/02/23 0450 0     Pain Loc --      Pain Education --      Exclude from Growth Chart --     Most recent vital signs: Vitals:   08/02/23 0452 08/02/23 0454  BP: 132/87   Pulse: 70   Resp: 19   Temp:  98.5 F (36.9 C)  SpO2: 99%      General: Awake, no distress.  Patient is able to speak in complete sentences without any difficulty or shortness of breath. CV:  Good peripheral perfusion.  Heart regular rate and rhythm. Resp:  Normal effort.  Lungs clear bilaterally. Abd:  No distention.  Other:     ED Results / Procedures / Treatments   Labs (all labs ordered are listed, but only abnormal results are displayed) Labs Reviewed  RESP PANEL BY RT-PCR (RSV, FLU A&B, COVID)  [id number] - Abnormal; Notable for the following components:      Result Value   SARS Coronavirus 2 by RT PCR POSITIVE (*)    All other components within normal limits  GROUP A STREP BY PCR      EKG Vent. rate 63 BPM PR interval 208 ms QRS duration 94 ms QT/QTcB 408/417 ms P-R-T axes 52 69 37 Normal sinus rhythm Normal ECG When compared with ECG of 10-Apr-2022 15:33    PROCEDURES:  Critical Care performed:   Procedures   MEDICATIONS ORDERED IN ED: Medications - No data to display   IMPRESSION / MDM / ASSESSMENT AND PLAN / ED COURSE  I reviewed the triage vital signs and the nursing notes.   Differential diagnosis includes, but is not limited to, COVID, influenza, URI, viral illness.  37 year old female presents to the ED with complaint of flulike symptoms for the last 3 to 4 days.  Patient is positive for COVID and was made aware.  We discussed a 10-day quarantine due to not having the COVID vaccine.  Tylenol, ibuprofen, increase fluids for comfort measures.  Patient requested a prescription for Flonase that she does not have any further medication that she has used in the  past.  A note for work was written.     Patient's presentation is most consistent with acute complicated illness / injury requiring diagnostic workup.  FINAL CLINICAL IMPRESSION(S) / ED DIAGNOSES   Final diagnoses:  COVID     Rx / DC Orders   ED Discharge Orders          Ordered    fluticasone (FLONASE) 50 MCG/ACT nasal spray  Daily        08/02/23 0748             Note:  This document was prepared using Dragon voice recognition software and may include unintentional dictation errors.   Tommi Rumps, PA-C 08/02/23 1339    Jene Every, MD 08/02/23 [phone]

## 2023-08-02 NOTE — ED Triage Notes (Signed)
Pt reports cold/flu symptoms x 4 days. Reports sore throat, h/a, fatigue, subjective fever. Pt ambulatory to triage. Alert and oriented. Breathing unlabored. Denies known sick contact

## 2023-08-03 ENCOUNTER — Ambulatory Visit: Payer: Medicaid Other | Admitting: Dietician

## 2023-08-03 DIAGNOSIS — U071 COVID-19: Secondary | ICD-10-CM | POA: Diagnosis not present

## 2023-08-03 DIAGNOSIS — R059 Cough, unspecified: Secondary | ICD-10-CM | POA: Diagnosis not present

## 2023-08-04 NOTE — Progress Notes (Deleted)
PCP:  Leilani Able, MD   No chief complaint on file.    HPI:      Ms. DELORIES ZIEMER is a 37 y.o. G3P3003 whose LMP was No LMP recorded. (Menstrual status: IUD)., presents today for her NP annual examination.  Her menses are regular every 28-30 days, lasting 8-12 days with IUD. Was 3-5 days after placement  4/19 but getting longer now. Flow is light to mod, just lasts a long time. Has occas BTB for a day, light flow. Mild dysmenorrhea. Hx of novasure ablation, and IUD, placed 4/19 for menorrhagia.Pt unsure which IUD she has. Was told it lasted "4 yrs" at the time which isn't any of them. Doesn't have IUD card. Never did Gyn u/s for BTB***  Sex activity: single partner, contraception - tubal ligation.  Last Pap: 12/17/17  Results were: no abnormalities /neg HPV DNA; no hx of abn paps. Hx of STDs: none  Hx of BV in past, confirmed on culture. Has been having fishy odor, increased vag d/c, min irritation since 8/21. Uses dove sens skin soap, dryer sheets, cotton underwear. Not doing probiotics/boric acid supp. No new partners.   Mammogram: 10/04/21 Cat 3; bilat dx due in 1 yr; 03/27/21 RT breast; cat 3--repeat in 6 months.  Had sebaceous cyst RT breast 10/21 but RT asymmetry noted on mammo.  There is no FH of breast cancer. There is no FH of ovarian cancer. The patient does do self-breast exams.  Tobacco use: The patient denies current or previous tobacco use. Alcohol use: none No drug use.  Exercise: moderately active  She does get adequate calcium and Vitamin D in her diet.  Past Medical History:  Diagnosis Date   Anemia    Anxiety    no meds   Depression    no meds   History of blood transfusion 2013   In [place]   Hypertension    Obesity    Vertigo     Past Surgical History:  Procedure Laterality Date   CESAREAN SECTION  [phone]   x 3 in [facility]   DILATION AND CURETTAGE OF UTERUS N/A 02/24/2018   Procedure: DILATATION AND CURETTAGE;  Surgeon: Reva Bores, MD;   Location: [facility];  Service: Gynecology;  Laterality: N/A;   INTRAUTERINE DEVICE (IUD) INSERTION N/A 02/24/2018   Procedure: INTRAUTERINE DEVICE (IUD) INSERTION;  Surgeon: Reva Bores, MD;  Location: [facility];  Service: Gynecology;  Laterality: N/A;   NOVASURE ABLATION     In [place]   TUBAL LIGATION     in [place]    Family History  Problem Relation Age of Onset   Diabetes Maternal Grandmother    Hypertension Maternal Grandmother    Diabetes Maternal Aunt    Hypertension Maternal Aunt    Diabetes Maternal Aunt    Hypertension Maternal Aunt    Healthy Mother    Asthma Father     Social History   Socioeconomic History   Marital status: Married    Spouse name: Not on file   Number of children: Not on file   Years of education: Not on file   Highest education level: Not on file  Occupational History   Not on file  Tobacco Use   Smoking status: Never   Smokeless tobacco: Never  Vaping Use   Vaping status: Never Used  Substance and Sexual Activity   Alcohol use: No   Drug use: No   Sexual activity: Yes    Birth control/protection:  Surgical, I.U.D.    Comment: Tubal Ligation  Other Topics Concern   Not on file  Social History Narrative   Not on file   Social Determinants of Health   Financial Resource Strain: Not on file  Food Insecurity: Not on file  Transportation Needs: Not on file  Physical Activity: Not on file  Stress: Not on file  Social Connections: Not on file  Intimate Partner Violence: Not on file     Current Outpatient Medications:    cetirizine (ZYRTEC ALLERGY) 10 MG tablet, Take 1 tablet (10 mg total) by mouth daily for 7 days., Disp: 7 tablet, Rfl: 0   fluticasone (FLONASE) 50 MCG/ACT nasal spray, Place 2 sprays into both nostrils daily., Disp: 15.8 mL, Rfl: 0   hydrOXYzine (VISTARIL) 25 MG capsule, Take 25 mg by mouth 3 (three) times daily., Disp: , Rfl:    metoprolol succinate (TOPROL-XL) 25 MG 24 hr tablet, Take 25 mg by mouth daily., Disp:  , Rfl:      ROS:  Review of Systems  Constitutional:  Negative for fatigue, fever and unexpected weight change.  Respiratory:  Negative for cough, shortness of breath and wheezing.   Cardiovascular:  Negative for chest pain, palpitations and leg swelling.  Gastrointestinal:  Negative for blood in stool, constipation, diarrhea, nausea and vomiting.  Endocrine: Negative for cold intolerance, heat intolerance and polyuria.  Genitourinary:  Positive for vaginal bleeding and vaginal discharge. Negative for dyspareunia, dysuria, flank pain, frequency, genital sores, hematuria, menstrual problem, pelvic pain, urgency and vaginal pain.  Musculoskeletal:  Negative for back pain, joint swelling and myalgias.  Skin:  Negative for rash.  Neurological:  Negative for dizziness, syncope, light-headedness, numbness and headaches.  Hematological:  Negative for adenopathy.  Psychiatric/Behavioral:  Negative for agitation, confusion, sleep disturbance and suicidal ideas. The patient is not nervous/anxious.    BREAST: No symptoms   Objective: There were no vitals taken for this visit.   Physical Exam Constitutional:      Appearance: She is well-developed.  Genitourinary:     Vulva normal.     Right Labia: No rash, tenderness or lesions.    Left Labia: No tenderness, lesions or rash.    Vaginal bleeding present.     No vaginal discharge, erythema or tenderness.      Right Adnexa: not tender and no mass present.    Left Adnexa: not tender and no mass present.    No cervical friability or polyp.     IUD strings visualized.     Uterus is not enlarged or tender.  Breasts:    Right: No mass, nipple discharge, skin change or tenderness.     Left: No mass, nipple discharge, skin change or tenderness.  Neck:     Thyroid: No thyromegaly.  Cardiovascular:     Rate and Rhythm: Normal rate and regular rhythm.     Heart sounds: Normal heart sounds. No murmur heard. Pulmonary:     Effort: Pulmonary  effort is normal.     Breath sounds: Normal breath sounds.  Abdominal:     Palpations: Abdomen is soft.     Tenderness: There is no abdominal tenderness. There is no guarding or rebound.  Musculoskeletal:        General: Normal range of motion.     Cervical back: Normal range of motion.  Lymphadenopathy:     Cervical: No cervical adenopathy.  Neurological:     General: No focal deficit present.     Mental Status: She  is alert and oriented to person, place, and time.     Cranial Nerves: No cranial nerve deficit.  Skin:    General: Skin is warm and dry.  Psychiatric:        Mood and Affect: Mood normal.        Behavior: Behavior normal.        Thought Content: Thought content normal.        Judgment: Judgment normal.  Vitals reviewed.     Results: No results found for this or any previous visit (from the past 24 hour(s)).   Assessment/Plan: Encounter for annual routine gynecological examination  Encounter for routine checking of intrauterine contraceptive device (IUD) - Plan: US PELVIS TRANSVAGINAL NON-OB (TV ONLY); IUD strings in cx os. Check Gyn u/s due to longer periods. Unsure which IUD she has. Discussed replacement with Mirena if not in correct location vs sx persisting.   Irregular bleeding - Plan: US PELVIS TRANSVAGINAL NON-OB (TV ONLY)  BV (bacterial vaginosis) - Plan: POCT Wet Prep with KOH, metroNIDAZOLE (FLAGYL) 500 MG tablet; pos sx and wet prep. Rx flagyl, no EtoH. Long hx of sx. Add probiotics, boric acid supp.  Line dry underwear/condoms for now.    No orders of the defined types were placed in this encounter.             GYN counsel breast self exam, mammography screening, adequate intake of calcium and vitamin D, diet and exercise     F/U  No follow-ups on file.  Abdulahi Schor B. Arita Severtson, PA-C 08/04/2023 12:34 PM

## 2023-08-05 DIAGNOSIS — U071 COVID-19: Secondary | ICD-10-CM | POA: Diagnosis not present

## 2023-08-06 ENCOUNTER — Ambulatory Visit: Payer: Self-pay | Admitting: Obstetrics and Gynecology

## 2023-08-06 DIAGNOSIS — R928 Other abnormal and inconclusive findings on diagnostic imaging of breast: Secondary | ICD-10-CM

## 2023-08-06 DIAGNOSIS — Z30431 Encounter for routine checking of intrauterine contraceptive device: Secondary | ICD-10-CM

## 2023-08-06 DIAGNOSIS — Z1231 Encounter for screening mammogram for malignant neoplasm of breast: Secondary | ICD-10-CM

## 2023-08-06 DIAGNOSIS — Z01419 Encounter for gynecological examination (general) (routine) without abnormal findings: Secondary | ICD-10-CM

## 2023-08-06 DIAGNOSIS — Z1151 Encounter for screening for human papillomavirus (HPV): Secondary | ICD-10-CM

## 2023-08-06 DIAGNOSIS — Z124 Encounter for screening for malignant neoplasm of cervix: Secondary | ICD-10-CM

## 2023-08-11 DIAGNOSIS — R42 Dizziness and giddiness: Secondary | ICD-10-CM | POA: Diagnosis not present

## 2023-08-11 DIAGNOSIS — Z8616 Personal history of COVID-19: Secondary | ICD-10-CM | POA: Diagnosis not present

## 2023-08-11 DIAGNOSIS — R0602 Shortness of breath: Secondary | ICD-10-CM | POA: Diagnosis not present

## 2023-08-11 DIAGNOSIS — R079 Chest pain, unspecified: Secondary | ICD-10-CM | POA: Diagnosis not present

## 2023-08-11 DIAGNOSIS — Z79899 Other long term (current) drug therapy: Secondary | ICD-10-CM | POA: Diagnosis not present

## 2023-08-11 DIAGNOSIS — R0789 Other chest pain: Secondary | ICD-10-CM | POA: Diagnosis not present

## 2023-08-11 DIAGNOSIS — I440 Atrioventricular block, first degree: Secondary | ICD-10-CM | POA: Diagnosis not present

## 2023-08-11 DIAGNOSIS — I10 Essential (primary) hypertension: Secondary | ICD-10-CM | POA: Diagnosis not present

## 2023-08-11 DIAGNOSIS — K5900 Constipation, unspecified: Secondary | ICD-10-CM | POA: Diagnosis not present

## 2023-08-11 DIAGNOSIS — R791 Abnormal coagulation profile: Secondary | ICD-10-CM | POA: Diagnosis not present

## 2023-08-11 DIAGNOSIS — F419 Anxiety disorder, unspecified: Secondary | ICD-10-CM | POA: Diagnosis not present

## 2023-08-11 DIAGNOSIS — E7800 Pure hypercholesterolemia, unspecified: Secondary | ICD-10-CM | POA: Diagnosis not present

## 2023-08-11 DIAGNOSIS — E669 Obesity, unspecified: Secondary | ICD-10-CM | POA: Diagnosis not present

## 2023-08-11 DIAGNOSIS — R002 Palpitations: Secondary | ICD-10-CM | POA: Diagnosis not present

## 2023-08-12 DIAGNOSIS — R42 Dizziness and giddiness: Secondary | ICD-10-CM | POA: Diagnosis not present

## 2023-08-12 DIAGNOSIS — R002 Palpitations: Secondary | ICD-10-CM | POA: Diagnosis not present

## 2023-08-13 DIAGNOSIS — I10 Essential (primary) hypertension: Secondary | ICD-10-CM | POA: Diagnosis not present

## 2023-08-13 DIAGNOSIS — E7800 Pure hypercholesterolemia, unspecified: Secondary | ICD-10-CM | POA: Diagnosis not present

## 2023-08-13 DIAGNOSIS — R002 Palpitations: Secondary | ICD-10-CM | POA: Diagnosis not present

## 2023-08-29 DIAGNOSIS — R002 Palpitations: Secondary | ICD-10-CM | POA: Diagnosis not present

## 2023-09-06 NOTE — Progress Notes (Unsigned)
PCP:  Leilani Able, MD   No chief complaint on file.    HPI:      [name] is a 37 y.o. G3P3003 whose LMP was No LMP recorded. (Menstrual status: IUD)., presents today for her NP annual examination.  Her menses are regular every 28-30 days, lasting 8-12 days with IUD. Was 3-5 days after placement  4/19 but getting longer now. Flow is light to mod, just lasts a long time. Has occas BTB for a day, light flow. Mild dysmenorrhea. Hx of novasure ablation, and IUD, placed 4/19 for menorrhagia.Pt unsure which IUD she has. Was told it lasted "4 yrs" at the time which isn't any of them. Doesn't have IUD card.   Sex activity: single partner, contraception - tubal ligation.  Last Pap: 12/17/17  Results were: no abnormalities /neg HPV DNA; no hx of abn paps. Hx of STDs: none  Hx of BV in past, confirmed on culture. Has been having fishy odor, increased vag d/c, min irritation since 8/21. Uses dove sens skin soap, dryer sheets, cotton underwear. Not doing probiotics/boric acid supp. No new partners.   Mammogram: 10/04/21 RT breast; Results were normal, repeat bilat dx mammo in 12 months. Had sebaceous cyst RT breast 10/21 but RT asymmetry noted on mammo.  There is no FH of breast cancer. There is no FH of ovarian cancer. The patient does do self-breast exams.  Tobacco use: The patient denies current or previous tobacco use. Alcohol use: none No drug use.  Exercise: moderately active  She does get adequate calcium and Vitamin D in her diet.  Past Medical History:  Diagnosis Date   Anemia    Anxiety    no meds   Depression    no meds   History of blood transfusion 2013   In [place]   Hypertension    Obesity    Vertigo     Past Surgical History:  Procedure Laterality Date   CESAREAN SECTION  [phone]   x 3 in [facility]   DILATION AND CURETTAGE OF UTERUS N/A 02/24/2018   Procedure: DILATATION AND CURETTAGE;  Surgeon: Reva Bores, MD;  Location: [facility];  Service:  Gynecology;  Laterality: N/A;   INTRAUTERINE DEVICE (IUD) INSERTION N/A 02/24/2018   Procedure: INTRAUTERINE DEVICE (IUD) INSERTION;  Surgeon: Reva Bores, MD;  Location: [facility];  Service: Gynecology;  Laterality: N/A;   NOVASURE ABLATION     In [place]   TUBAL LIGATION     in [place]    Family History  Problem Relation Age of Onset   Diabetes Maternal Grandmother    Hypertension Maternal Grandmother    Diabetes Maternal Aunt    Hypertension Maternal Aunt    Diabetes Maternal Aunt    Hypertension Maternal Aunt    Healthy Mother    Asthma Father     Social History   Socioeconomic History   Marital status: Married    Spouse name: Not on file   Number of children: Not on file   Years of education: Not on file   Highest education level: Not on file  Occupational History   Not on file  Tobacco Use   Smoking status: Never   Smokeless tobacco: Never  Vaping Use   Vaping status: Never Used  Substance and Sexual Activity   Alcohol use: No   Drug use: No   Sexual activity: Yes    Birth control/protection: Surgical, I.U.D.    Comment: Tubal Ligation  Other  Topics Concern   Not on file  Social History Narrative   Not on file   Social Determinants of Health   Financial Resource Strain: Not on file  Food Insecurity: Not on file  Transportation Needs: Not on file  Physical Activity: Not on file  Stress: Not on file  Social Connections: Not on file  Intimate Partner Violence: Not on file     Current Outpatient Medications:    cetirizine (ZYRTEC ALLERGY) 10 MG tablet, Take 1 tablet (10 mg total) by mouth daily for 7 days., Disp: 7 tablet, Rfl: 0   fluticasone (FLONASE) 50 MCG/ACT nasal spray, Place 2 sprays into both nostrils daily., Disp: 15.8 mL, Rfl: 0   hydrOXYzine (VISTARIL) 25 MG capsule, Take 25 mg by mouth 3 (three) times daily., Disp: , Rfl:    metoprolol succinate (TOPROL-XL) 25 MG 24 hr tablet, Take 25 mg by mouth daily., Disp: , Rfl:       ROS:  Review of Systems  Constitutional:  Negative for fatigue, fever and unexpected weight change.  Respiratory:  Negative for cough, shortness of breath and wheezing.   Cardiovascular:  Negative for chest pain, palpitations and leg swelling.  Gastrointestinal:  Negative for blood in stool, constipation, diarrhea, nausea and vomiting.  Endocrine: Negative for cold intolerance, heat intolerance and polyuria.  Genitourinary:  Positive for vaginal bleeding and vaginal discharge. Negative for dyspareunia, dysuria, flank pain, frequency, genital sores, hematuria, menstrual problem, pelvic pain, urgency and vaginal pain.  Musculoskeletal:  Negative for back pain, joint swelling and myalgias.  Skin:  Negative for rash.  Neurological:  Negative for dizziness, syncope, light-headedness, numbness and headaches.  Hematological:  Negative for adenopathy.  Psychiatric/Behavioral:  Negative for agitation, confusion, sleep disturbance and suicidal ideas. The patient is not nervous/anxious.    BREAST: No symptoms   Objective: There were no vitals taken for this visit.   Physical Exam Constitutional:      Appearance: She is well-developed.  Genitourinary:     Vulva normal.     Right Labia: No rash, tenderness or lesions.    Left Labia: No tenderness, lesions or rash.    Vaginal bleeding present.     No vaginal discharge, erythema or tenderness.      Right Adnexa: not tender and no mass present.    Left Adnexa: not tender and no mass present.    No cervical friability or polyp.     IUD strings visualized.     Uterus is not enlarged or tender.  Breasts:    Right: No mass, nipple discharge, skin change or tenderness.     Left: No mass, nipple discharge, skin change or tenderness.  Neck:     Thyroid: No thyromegaly.  Cardiovascular:     Rate and Rhythm: Normal rate and regular rhythm.     Heart sounds: Normal heart sounds. No murmur heard. Pulmonary:     Effort: Pulmonary effort  is normal.     Breath sounds: Normal breath sounds.  Abdominal:     Palpations: Abdomen is soft.     Tenderness: There is no abdominal tenderness. There is no guarding or rebound.  Musculoskeletal:        General: Normal range of motion.     Cervical back: Normal range of motion.  Lymphadenopathy:     Cervical: No cervical adenopathy.  Neurological:     General: No focal deficit present.     Mental Status: She is alert and oriented to person, place, and time.  Cranial Nerves: No cranial nerve deficit.  Skin:    General: Skin is warm and dry.  Psychiatric:        Mood and Affect: Mood normal.        Behavior: Behavior normal.        Thought Content: Thought content normal.        Judgment: Judgment normal.  Vitals reviewed.     Results: No results found for this or any previous visit (from the past 24 hour(s)).   Assessment/Plan: Encounter for annual routine gynecological examination  Encounter for routine checking of intrauterine contraceptive device (IUD) - Plan: US PELVIS TRANSVAGINAL NON-OB (TV ONLY); IUD strings in cx os. Check Gyn u/s due to longer periods. Unsure which IUD she has. Discussed replacement with Mirena if not in correct location vs sx persisting.   Irregular bleeding - Plan: US PELVIS TRANSVAGINAL NON-OB (TV ONLY)  BV (bacterial vaginosis) - Plan: POCT Wet Prep with KOH, metroNIDAZOLE (FLAGYL) 500 MG tablet; pos sx and wet prep. Rx flagyl, no EtoH. Long hx of sx. Add probiotics, boric acid supp.  Line dry underwear/condoms for now.    No orders of the defined types were placed in this encounter.             GYN counsel breast self exam, mammography screening, adequate intake of calcium and vitamin D, diet and exercise     F/U  No follow-ups on file.  Abdurahman Rugg B. Orion Mole, PA-C 09/06/2023 4:41 PM

## 2023-09-07 ENCOUNTER — Other Ambulatory Visit (HOSPITAL_COMMUNITY)
Admission: RE | Admit: 2023-09-07 | Discharge: 2023-09-07 | Disposition: A | Discharge status: Home / Self Care | Payer: BC Managed Care – PPO | Source: Ambulatory Visit | Attending: Obstetrics and Gynecology | Admitting: Obstetrics and Gynecology

## 2023-09-07 ENCOUNTER — Encounter: Payer: Self-pay | Admitting: Obstetrics and Gynecology

## 2023-09-07 ENCOUNTER — Ambulatory Visit: Payer: BC Managed Care – PPO | Admitting: Obstetrics and Gynecology

## 2023-09-07 VITALS — BP 133/83 | Ht 67.0 in | Wt 313.0 lb

## 2023-09-07 DIAGNOSIS — Z87898 Personal history of other specified conditions: Secondary | ICD-10-CM

## 2023-09-07 DIAGNOSIS — Z124 Encounter for screening for malignant neoplasm of cervix: Secondary | ICD-10-CM

## 2023-09-07 DIAGNOSIS — Z1151 Encounter for screening for human papillomavirus (HPV): Secondary | ICD-10-CM

## 2023-09-07 DIAGNOSIS — Z30431 Encounter for routine checking of intrauterine contraceptive device: Secondary | ICD-10-CM

## 2023-09-07 DIAGNOSIS — Z01419 Encounter for gynecological examination (general) (routine) without abnormal findings: Secondary | ICD-10-CM | POA: Diagnosis not present

## 2023-09-07 DIAGNOSIS — H05222 Edema of left orbit: Secondary | ICD-10-CM | POA: Diagnosis not present

## 2023-09-07 DIAGNOSIS — N760 Acute vaginitis: Secondary | ICD-10-CM

## 2023-09-07 DIAGNOSIS — R21 Rash and other nonspecific skin eruption: Secondary | ICD-10-CM | POA: Diagnosis not present

## 2023-09-07 DIAGNOSIS — N939 Abnormal uterine and vaginal bleeding, unspecified: Secondary | ICD-10-CM

## 2023-09-07 DIAGNOSIS — Z1231 Encounter for screening mammogram for malignant neoplasm of breast: Secondary | ICD-10-CM

## 2023-09-07 MED ORDER — METRONIDAZOLE 500 MG PO TABS: ORAL_TABLET | ORAL | 0 refills | Status: DC

## 2023-09-07 NOTE — Patient Instructions (Addendum)
I value your feedback and you entrusting us with your care. If you get a [facility] patient survey, I would appreciate you taking the time to let us know about your experience today. Thank you!  [facility] ([place])--[phone]  

## 2023-09-10 LAB — CYTOLOGY - PAP
Comment: NEGATIVE
Diagnosis: NEGATIVE
High risk HPV: NEGATIVE

## 2023-09-25 ENCOUNTER — Other Ambulatory Visit: Payer: BC Managed Care – PPO

## 2023-09-29 DIAGNOSIS — R002 Palpitations: Secondary | ICD-10-CM | POA: Diagnosis not present

## 2023-10-05 DIAGNOSIS — I10 Essential (primary) hypertension: Secondary | ICD-10-CM | POA: Diagnosis not present

## 2023-10-05 DIAGNOSIS — D509 Iron deficiency anemia, unspecified: Secondary | ICD-10-CM | POA: Diagnosis not present

## 2023-10-09 ENCOUNTER — Other Ambulatory Visit: Payer: BC Managed Care – PPO

## 2023-10-09 ENCOUNTER — Telehealth: Payer: Self-pay | Admitting: Obstetrics and Gynecology

## 2023-10-09 NOTE — Telephone Encounter (Signed)
Reached out to pt to reschedule gyn [place] that was scheduled on 10/09/2023 at 10:15.  Rescheduled the pt for Jan. 6, 2025 at 9:15.

## 2023-10-12 DIAGNOSIS — R002 Palpitations: Secondary | ICD-10-CM | POA: Diagnosis not present

## 2023-10-12 DIAGNOSIS — E7800 Pure hypercholesterolemia, unspecified: Secondary | ICD-10-CM | POA: Diagnosis not present

## 2023-10-12 DIAGNOSIS — I10 Essential (primary) hypertension: Secondary | ICD-10-CM | POA: Diagnosis not present

## 2023-10-13 DIAGNOSIS — I10 Essential (primary) hypertension: Secondary | ICD-10-CM | POA: Diagnosis not present

## 2023-10-13 DIAGNOSIS — Z6841 Body Mass Index (BMI) 40.0 and over, adult: Secondary | ICD-10-CM | POA: Diagnosis not present

## 2023-10-13 DIAGNOSIS — G4733 Obstructive sleep apnea (adult) (pediatric): Secondary | ICD-10-CM | POA: Diagnosis not present

## 2023-10-23 DIAGNOSIS — J0110 Acute frontal sinusitis, unspecified: Secondary | ICD-10-CM | POA: Diagnosis not present

## 2023-10-23 DIAGNOSIS — Z1152 Encounter for screening for COVID-19: Secondary | ICD-10-CM | POA: Diagnosis not present

## 2023-10-23 DIAGNOSIS — Z79899 Other long term (current) drug therapy: Secondary | ICD-10-CM | POA: Diagnosis not present

## 2023-10-23 DIAGNOSIS — R42 Dizziness and giddiness: Secondary | ICD-10-CM | POA: Diagnosis not present

## 2023-10-23 DIAGNOSIS — F419 Anxiety disorder, unspecified: Secondary | ICD-10-CM | POA: Diagnosis not present

## 2023-10-23 DIAGNOSIS — I10 Essential (primary) hypertension: Secondary | ICD-10-CM | POA: Diagnosis not present

## 2023-10-23 DIAGNOSIS — E860 Dehydration: Secondary | ICD-10-CM | POA: Diagnosis not present

## 2023-10-24 ENCOUNTER — Other Ambulatory Visit: Payer: Self-pay

## 2023-10-24 ENCOUNTER — Emergency Department (HOSPITAL_BASED_OUTPATIENT_CLINIC_OR_DEPARTMENT_OTHER)
Admission: EM | Admit: 2023-10-24 | Discharge: 2023-10-24 | Disposition: A | Discharge status: Home / Self Care | Payer: BC Managed Care – PPO | Attending: Emergency Medicine | Admitting: Emergency Medicine

## 2023-10-24 ENCOUNTER — Encounter (HOSPITAL_BASED_OUTPATIENT_CLINIC_OR_DEPARTMENT_OTHER): Payer: Self-pay

## 2023-10-24 ENCOUNTER — Emergency Department (HOSPITAL_BASED_OUTPATIENT_CLINIC_OR_DEPARTMENT_OTHER): Payer: BC Managed Care – PPO

## 2023-10-24 DIAGNOSIS — Z20822 Contact with and (suspected) exposure to covid-19: Secondary | ICD-10-CM | POA: Diagnosis not present

## 2023-10-24 DIAGNOSIS — R42 Dizziness and giddiness: Secondary | ICD-10-CM | POA: Insufficient documentation

## 2023-10-24 DIAGNOSIS — Z0000 Encounter for general adult medical examination without abnormal findings: Secondary | ICD-10-CM

## 2023-10-24 DIAGNOSIS — R519 Headache, unspecified: Secondary | ICD-10-CM | POA: Diagnosis not present

## 2023-10-24 DIAGNOSIS — R002 Palpitations: Secondary | ICD-10-CM | POA: Insufficient documentation

## 2023-10-24 LAB — URINALYSIS, MICROSCOPIC (REFLEX)

## 2023-10-24 LAB — URINALYSIS, ROUTINE W REFLEX MICROSCOPIC
Bilirubin Urine: NEGATIVE
Glucose, UA: NEGATIVE mg/dL
Ketones, ur: NEGATIVE mg/dL
Leukocytes,Ua: NEGATIVE
Nitrite: NEGATIVE
Protein, ur: NEGATIVE mg/dL
Specific Gravity, Urine: 1.015 (ref 1.005–1.030)
pH: 7.5 (ref 5.0–8.0)

## 2023-10-24 LAB — BASIC METABOLIC PANEL
Anion gap: 6 (ref 5–15)
BUN: 14 mg/dL (ref 6–20)
CO2: 27 mmol/L (ref 22–32)
Calcium: 8.9 mg/dL (ref 8.9–10.3)
Chloride: 105 mmol/L (ref 98–111)
Creatinine, Ser: 0.82 mg/dL (ref 0.44–1.00)
GFR, Estimated: >60 mL/min (ref >60)
Glucose, Bld: 87 mg/dL (ref 70–99)
Potassium: 3.6 mmol/L (ref 3.5–5.1)
Sodium: 138 mmol/L (ref 135–145)

## 2023-10-24 LAB — CBG MONITORING, ED: Glucose-Capillary: 100 mg/dL — ABNORMAL HIGH (ref 70–99)

## 2023-10-24 LAB — CBC
HCT: 37.1 % (ref 36.0–46.0)
Hemoglobin: 11.7 g/dL — ABNORMAL LOW (ref 12.0–15.0)
MCH: 25.2 pg — ABNORMAL LOW (ref 26.0–34.0)
MCHC: 31.5 g/dL (ref 30.0–36.0)
MCV: 79.8 fL — ABNORMAL LOW (ref 80.0–100.0)
Platelets: 199 10*3/uL (ref 150–400)
RBC: 4.65 MIL/uL (ref 3.87–5.11)
RDW: 15.3 % (ref 11.5–15.5)
WBC: 4.7 10*3/uL (ref 4.0–10.5)
nRBC: 0 % (ref 0.0–0.2)

## 2023-10-24 LAB — TROPONIN I (HIGH SENSITIVITY): Troponin I (High Sensitivity): <2 ng/L (ref <18)

## 2023-10-24 LAB — RESP PANEL BY RT-PCR (RSV, FLU A&B, COVID)  RVPGX2
Influenza A by PCR: NEGATIVE
Influenza B by PCR: NEGATIVE
Resp Syncytial Virus by PCR: NEGATIVE
SARS Coronavirus 2 by RT PCR: NEGATIVE

## 2023-10-24 LAB — PREGNANCY, URINE: Preg Test, Ur: NEGATIVE

## 2023-10-24 MED ORDER — HYDROXYZINE HCL 25 MG PO TABS: 25.0000 mg | ORAL_TABLET | Freq: Four times a day (QID) | ORAL | 0 refills | Status: DC

## 2023-10-24 NOTE — ED Notes (Signed)
Patient ambulatory to the BR 

## 2023-10-24 NOTE — ED Notes (Signed)
Pt alert and oriented X 4 at the time of discharge. RR even and unlabored. No acute distress noted. Pt verbalized understanding of discharge instructions as discussed. Pt ambulatory to lobby at time of discharge.

## 2023-10-24 NOTE — ED Triage Notes (Signed)
The patient has been lightheaded since wednesday with runny nose. She has had palpitations for one week

## 2023-10-24 NOTE — ED Notes (Signed)
UA in lab if needed

## 2023-10-24 NOTE — ED Provider Notes (Signed)
[facility] Provider Note   CSN: [id number] Arrival date & time: 10/24/23  1020     History  Chief Complaint  Patient presents with   Dizziness   Palpitations    Jaeleigh L Savoia is a 37 y.o. female.  This is a 37 year old female is here today with multiple complaints.  She states that she has felt intermittently dizzy for over 1 week, she has felt like she has had palpitations, she is also had pain in her arm.  She is worried that she might be having a stroke, having a heart attack, or have meningitis.   Dizziness Associated symptoms: palpitations   Palpitations Associated symptoms: dizziness        Home Medications Prior to Admission medications   Medication Sig Start Date End Date Taking? Authorizing Provider  cetirizine (ZYRTEC ALLERGY) 10 MG tablet Take 1 tablet (10 mg total) by mouth daily for 7 days. 10/26/22 11/02/22  Poggi, Herb Grays, PA-C  Ferrous Sulfate (IRON PO) Take 1 tablet every day by oral route.    [provider]  fluticasone (FLONASE) 50 MCG/ACT nasal spray Place 2 sprays into both nostrils daily. 08/02/23 08/01/24  Tommi Rumps, PA-C  hydrocortisone 2.5 % cream Apply topically. 02/07/22   [provider]  hydrOXYzine (VISTARIL) 25 MG capsule Take 25 mg by mouth 3 (three) times daily. 08/09/21   [provider]  ibuprofen (ADVIL) 800 MG tablet Take 800 mg by mouth 3 (three) times daily.    [provider]  metoprolol succinate (TOPROL-XL) 25 MG 24 hr tablet Take 25 mg by mouth daily.    [provider]  metroNIDAZOLE (FLAGYL) 500 MG tablet Take 1 tab BID for 7 days; NO alcohol use for 10 days after prescription start 09/07/23   Copland, Alicia B, PA-C  tiZANidine (ZANAFLEX) 2 MG tablet Take by mouth. 08/12/23   [provider]  WEGOVY 1.7 MG/0.75ML SOAJ Inject into the skin. 09/02/23   [provider]  ferrous sulfate 325 (65 FE) MG tablet Take 1 tablet  (325 mg total) by mouth daily. Patient not taking: Reported on 10/23/2019 05/05/18 12/12/19  Mortis, Jerrel Ivory I, PA-C  hydrochlorothiazide (HYDRODIURIL) 25 MG tablet Take 1 tablet (25 mg total) by mouth daily. Patient not taking: Reported on 10/23/2019 05/18/19 12/12/19  Elson Areas, PA-C      Allergies    Patient has no known allergies.    Review of Systems   Review of Systems  Cardiovascular:  Positive for palpitations.  Neurological:  Positive for dizziness.    Physical Exam Updated Vital Signs BP 118/70   Pulse 73   Temp 98.2 F (36.8 C) (Oral)   Resp 15   Ht 5\' 7"  (1.702 m)   Wt (!) 138 kg   SpO2 100%   BMI 47.65 kg/m  Physical Exam Vitals reviewed.  Constitutional:      Appearance: Normal appearance.  Eyes:     Pupils: Pupils are equal, round, and reactive to light.  Cardiovascular:     Rate and Rhythm: Normal rate and regular rhythm.  Pulmonary:     Effort: Pulmonary effort is normal.  Abdominal:     General: Abdomen is flat.  Musculoskeletal:     Cervical back: Normal range of motion and neck supple. No rigidity.  Skin:    General: Skin is dry.  Neurological:     General: No focal deficit present.     Mental Status: She is  alert.     Cranial Nerves: No cranial nerve deficit.     Motor: No weakness.     Gait: Gait normal.     ED Results / Procedures / Treatments   Labs (all labs ordered are listed, but only abnormal results are displayed) Labs Reviewed  CBC - Abnormal; Notable for the following components:      Result Value   Hemoglobin 11.7 (*)    MCV 79.8 (*)    MCH 25.2 (*)    All other components within normal limits  URINALYSIS, ROUTINE W REFLEX MICROSCOPIC - Abnormal; Notable for the following components:   Hgb urine dipstick LARGE (*)    All other components within normal limits  URINALYSIS, MICROSCOPIC (REFLEX) - Abnormal; Notable for the following components:   Bacteria, UA FEW (*)    All other components within normal limits  CBG  MONITORING, ED - Abnormal; Notable for the following components:   Glucose-Capillary 100 (*)    All other components within normal limits  RESP PANEL BY RT-PCR (RSV, FLU A&B, COVID)  [id number]  BASIC METABOLIC PANEL  PREGNANCY, URINE  TSH  TROPONIN I (HIGH SENSITIVITY)  TROPONIN I (HIGH SENSITIVITY)    EKG None  Radiology CT Head Wo Contrast  Result Date: 10/24/2023 CLINICAL DATA:  37 year old female with sudden severe headache. Lightheaded for 3 days. Runny nose. Palpitations. EXAM: CT HEAD WITHOUT CONTRAST TECHNIQUE: Contiguous axial images were obtained from the base of the skull through the vertex without intravenous contrast. RADIATION DOSE REDUCTION: This exam was performed according to the departmental dose-optimization program which includes automated exposure control, adjustment of the mA and/or kV according to patient size and/or use of iterative reconstruction technique. COMPARISON:  Head CT 10/04/2020. FINDINGS: Brain: Normal cerebral volume. No midline shift, ventriculomegaly, mass effect, evidence of mass lesion, intracranial hemorrhage or evidence of cortically based acute infarction. Gray-white matter differentiation is within normal limits throughout the brain. Normal basilar cisterns. Vascular: No suspicious intracranial vascular hyperdensity. Skull: Intact, negative. Sinuses/Orbits: Visualized paranasal sinuses and mastoids are stable and well aerated. Other: Rightward gaze but otherwise negative orbit and scalp soft tissues. Incidental right eyebrow piercing. IMPRESSION: Stable and Normal noncontrast Head CT. Electronically Signed   By: Odessa Fleming M.D.   On: 10/24/2023 11:53    Procedures Procedures    Medications Ordered in ED Medications - No data to display  ED Course/ Medical Decision Making/ A&P                                 Medical Decision Making 37 year old female here today for multiple symptoms.  Plan-I do not have a clear unifying differential diagnosis for  the patient's symptoms.  Will cast a wide net, include thyroid studies, blood work.  My independent review the patient's EKG shows no evidence of acute ischemia.  Overall patient looks well, normal neurological exam.  I do not believe that the patient has meningitis, she has no symptoms consistent with CVA.  I think that anxiety is contributing to the patient's presenting symptoms.  Reassessment 2:30 PM-patient's CT head, for independent review shows no intracranial hemorrhage.  Reviewed the patient's labs, has stable anemia.  Kidney function normal.  EKG, per my independent review no evidence of acute ischemia.  TSH is a send out.  Urinalysis normal.  Will discharge patient.  She will follow-up with her PCP.  Amount and/or Complexity of Data Reviewed Labs: ordered. Radiology: ordered.  Final Clinical Impression(s) / ED Diagnoses Final diagnoses:  General medical exam    Rx / DC Orders ED Discharge Orders     None         Arletha Pili, DO 10/24/23 1436

## 2023-10-24 NOTE — Discharge Instructions (Addendum)
While you are in the emergency department you had a head CT that was normal.  Your chest x-ray was normal.  Your blood work was normal.  Your EKG was normal.  At this time, you do not appear to have a medical emergency.  You can follow-up with your primary care doctor.

## 2023-10-28 DIAGNOSIS — F419 Anxiety disorder, unspecified: Secondary | ICD-10-CM | POA: Diagnosis not present

## 2023-10-28 DIAGNOSIS — I10 Essential (primary) hypertension: Secondary | ICD-10-CM | POA: Diagnosis not present

## 2023-10-28 DIAGNOSIS — D509 Iron deficiency anemia, unspecified: Secondary | ICD-10-CM | POA: Diagnosis not present

## 2023-11-23 ENCOUNTER — Other Ambulatory Visit: Payer: BC Managed Care – PPO

## 2023-11-25 ENCOUNTER — Ambulatory Visit: Admission: RE | Admit: 2023-11-25 | Payer: BC Managed Care – PPO | Source: Ambulatory Visit

## 2023-12-05 ENCOUNTER — Encounter: Payer: Self-pay | Admitting: *Deleted

## 2023-12-05 ENCOUNTER — Emergency Department
Admission: EM | Admit: 2023-12-05 | Discharge: 2023-12-05 | Disposition: A | Discharge status: Home / Self Care | Payer: BC Managed Care – PPO | Attending: Student in an Organized Health Care Education/Training Program | Admitting: Student in an Organized Health Care Education/Training Program

## 2023-12-05 ENCOUNTER — Other Ambulatory Visit: Payer: Self-pay

## 2023-12-05 DIAGNOSIS — I10 Essential (primary) hypertension: Secondary | ICD-10-CM | POA: Diagnosis not present

## 2023-12-05 DIAGNOSIS — M5412 Radiculopathy, cervical region: Secondary | ICD-10-CM | POA: Insufficient documentation

## 2023-12-05 DIAGNOSIS — M542 Cervicalgia: Secondary | ICD-10-CM | POA: Diagnosis present

## 2023-12-05 MED ORDER — KETOROLAC TROMETHAMINE 10 MG PO TABS: 10.0000 mg | ORAL_TABLET | Freq: Four times a day (QID) | ORAL | 0 refills | Status: DC

## 2023-12-05 MED ORDER — METHOCARBAMOL 500 MG PO TABS: 500.0000 mg | ORAL_TABLET | Freq: Four times a day (QID) | ORAL | 0 refills | Status: AC | PRN

## 2023-12-05 MED ORDER — KETOROLAC TROMETHAMINE 30 MG/ML IJ SOLN
30.0000 mg | Freq: Once | INTRAMUSCULAR | Status: AC
  Administered 2023-12-05: 30 mg via INTRAMUSCULAR
  Filled 2023-12-05: qty 1

## 2023-12-05 NOTE — ED Provider Notes (Signed)
[facility] Provider Note    Event Date/Time   First MD Initiated Contact with Patient 12/05/23 [phone]     (approximate)   History   Neck Pain   HPI  Valoria L Hogg is a 38 y.o. female   presents to the ED with complaint of posterior neck pain for approximately 1 week which is now causing her headache.  Patient states she has not taken any over-the-counter medication.  She denies any injury to her back.  She also reports a warm sensation in her back that radiates into her right thigh also without history of injury.  Patient has a history of hypertension and iron deficiency anemia.      Physical Exam   Triage Vital Signs: ED Triage Vitals  Encounter Vitals Group     BP 12/05/23 0938 (!) 121/90     Systolic BP Percentile --      Diastolic BP Percentile --      Pulse Rate 12/05/23 0938 70     Resp 12/05/23 0938 18     Temp 12/05/23 0938 98.6 F (37 C)     Temp Source 12/05/23 0938 Oral     SpO2 12/05/23 0938 100 %     Weight 12/05/23 0942 290 lb (131.5 kg)     Height 12/05/23 0942 5\' 6"  (1.676 m)     Head Circumference --      Peak Flow --      Pain Score 12/05/23 0941 4     Pain Loc --      Pain Education --      Exclude from Growth Chart --     Most recent vital signs: Vitals:   12/05/23 0938  BP: (!) 121/90  Pulse: 70  Resp: 18  Temp: 98.6 F (37 C)  SpO2: 100%     General: Awake, no distress.  CV:  Good peripheral perfusion.  Heart regular rate and rhythm. Resp:  Normal effort.  Lungs are clear bilaterally. Abd:  No distention.  Other:  No point tenderness on palpation of cervical spine posteriorly.  There is tenderness on palpation of the right trapezius muscle.  Range of motion is mildly restricted with lateral movement and patient has discomfort with movement to the left.  No pain with flexion or extension.  Skin is intact.  No soft tissue edema.   ED Results / Procedures / Treatments   Labs (all labs ordered are listed, but  only abnormal results are displayed) Labs Reviewed - No data to display     PROCEDURES:  Critical Care performed:   Procedures   MEDICATIONS ORDERED IN ED: Medications  ketorolac (TORADOL) 30 MG/ML injection 30 mg (30 mg Intramuscular Given 12/05/23 1050)     IMPRESSION / MDM / ASSESSMENT AND PLAN / ED COURSE  I reviewed the triage vital signs and the nursing notes.   Differential diagnosis includes, but is not limited to, cervical strain, cervical pain, cervical radiculopathy, right trapezius muscle spasm, torticollis.  38 year old female presents to the ED with complaint of right sided cervical pain without history of injury.  Patient has taken over-the-counter medication without any relief.  There is point tenderness on palpation of the right trapezius muscle.  Patient was given Toradol 30 mg IM while in the emergency department.  A prescription for methocarbamol 500 mg every 6 hours and Toradol 10 mg p.o. every 6 hours was sent to the pharmacy for her to begin taking.  She is also encouraged to  use warm moist compresses to the area frequently and return to the emergency department if any severe worsening of her symptoms.      Patient's presentation is most consistent with acute complicated illness / injury requiring diagnostic workup.  FINAL CLINICAL IMPRESSION(S) / ED DIAGNOSES   Final diagnoses:  Cervical radiculopathy     Rx / DC Orders   ED Discharge Orders          Ordered    methocarbamol (ROBAXIN) 500 MG tablet  Every 6 hours PRN        12/05/23 1032    ketorolac (TORADOL) 10 MG tablet  Every 6 hours        12/05/23 1035             Note:  This document was prepared using Dragon voice recognition software and may include unintentional dictation errors.   Tommi Rumps, PA-C 12/05/23 1128    Willy Eddy, MD 12/05/23 [phone]

## 2023-12-05 NOTE — ED Triage Notes (Signed)
Patient c/o posterior neck for over a week and now has a headache and occasional warm sensation in the back of her right thigh.

## 2023-12-05 NOTE — Discharge Instructions (Addendum)
Call make an appointment your primary care provider if any continued problems or concerns.  A prescription for methocarbamol was sent to the pharmacy for you to begin taking for muscle spasms.  A prescription for ketorolac was also sent to the pharmacy which should help with inflammation and pain.  Do not take additional ibuprofen with this medication.  You may take Tylenol if additional pain medication is needed.  Also warm moist compresses to your neck and shoulder as needed for discomfort.  Follow-up with your PCP if not improving.

## 2023-12-07 ENCOUNTER — Other Ambulatory Visit: Payer: BC Managed Care – PPO

## 2023-12-16 ENCOUNTER — Other Ambulatory Visit: Payer: BC Managed Care – PPO

## 2024-01-15 ENCOUNTER — Other Ambulatory Visit: Payer: BC Managed Care – PPO

## 2024-01-17 ENCOUNTER — Emergency Department
Admission: EM | Admit: 2024-01-17 | Discharge: 2024-01-17 | Disposition: A | Discharge status: Home / Self Care | Payer: Self-pay | Attending: Emergency Medicine | Admitting: Emergency Medicine

## 2024-01-17 ENCOUNTER — Other Ambulatory Visit: Payer: Self-pay

## 2024-01-17 DIAGNOSIS — R109 Unspecified abdominal pain: Secondary | ICD-10-CM | POA: Diagnosis not present

## 2024-01-17 DIAGNOSIS — X58XXXA Exposure to other specified factors, initial encounter: Secondary | ICD-10-CM | POA: Diagnosis not present

## 2024-01-17 DIAGNOSIS — S161XXA Strain of muscle, fascia and tendon at neck level, initial encounter: Secondary | ICD-10-CM | POA: Diagnosis not present

## 2024-01-17 DIAGNOSIS — D508 Other iron deficiency anemias: Secondary | ICD-10-CM | POA: Diagnosis not present

## 2024-01-17 DIAGNOSIS — S199XXA Unspecified injury of neck, initial encounter: Secondary | ICD-10-CM | POA: Diagnosis present

## 2024-01-17 LAB — CBC WITH DIFFERENTIAL/PLATELET
Abs Immature Granulocytes: 0 10*3/uL (ref 0.00–0.07)
Basophils Absolute: 0 10*3/uL (ref 0.0–0.1)
Basophils Relative: 0 %
Eosinophils Absolute: 0.1 10*3/uL (ref 0.0–0.5)
Eosinophils Relative: 2 %
HCT: 35 % — ABNORMAL LOW (ref 36.0–46.0)
Hemoglobin: 11 g/dL — ABNORMAL LOW (ref 12.0–15.0)
Immature Granulocytes: 0 %
Lymphocytes Relative: 45 %
Lymphs Abs: 2.1 10*3/uL (ref 0.7–4.0)
MCH: 25.7 pg — ABNORMAL LOW (ref 26.0–34.0)
MCHC: 31.4 g/dL (ref 30.0–36.0)
MCV: 81.8 fL (ref 80.0–100.0)
Monocytes Absolute: 0.2 10*3/uL (ref 0.1–1.0)
Monocytes Relative: 5 %
Neutro Abs: 2.3 10*3/uL (ref 1.7–7.7)
Neutrophils Relative %: 48 %
Platelets: 171 10*3/uL (ref 150–400)
RBC: 4.28 MIL/uL (ref 3.87–5.11)
RDW: 14.3 % (ref 11.5–15.5)
WBC: 4.7 10*3/uL (ref 4.0–10.5)
nRBC: 0 % (ref 0.0–0.2)

## 2024-01-17 LAB — BASIC METABOLIC PANEL
Anion gap: 8 (ref 5–15)
BUN: 18 mg/dL (ref 6–20)
CO2: 25 mmol/L (ref 22–32)
Calcium: 8.9 mg/dL (ref 8.9–10.3)
Chloride: 106 mmol/L (ref 98–111)
Creatinine, Ser: 0.87 mg/dL (ref 0.44–1.00)
GFR, Estimated: >60 mL/min (ref >60)
Glucose, Bld: 92 mg/dL (ref 70–99)
Potassium: 3.9 mmol/L (ref 3.5–5.1)
Sodium: 139 mmol/L (ref 135–145)

## 2024-01-17 LAB — LIPASE, BLOOD: Lipase: 29 U/L (ref 11–51)

## 2024-01-17 NOTE — ED Triage Notes (Signed)
 Pt to ed from home via POV for back pain in the upper with some "tingling" sensations. Pt is caox4, in no acute distress and ambulatory in triage. Pt denies any lifting injuries or falls. Pt advised it was sudden onset without warning.

## 2024-01-17 NOTE — ED Provider Notes (Signed)
 [facility] Provider Note    Event Date/Time   First MD Initiated Contact with Patient 01/17/24 2200     (approximate)   History   Back Pain   HPI  Diamond Cook is a 38 y.o. female patient presents today with thoracic back pain, epigastric pain described as a burning sensation.  Patient is using Wegovy in the last 8 months she is states that lost 60 pounds.  Patient states last bowel movement was 3 days ago, decreased appetite.  Patient states having numbness and tingling on her feet on hands.  Pain is not related with ingestion of food.  Patient denies diarrhea     Physical Exam   Triage Vital Signs: ED Triage Vitals  Encounter Vitals Group     BP 01/17/24 2051 (!) 131/99     Systolic BP Percentile --      Diastolic BP Percentile --      Pulse Rate 01/17/24 2051 99     Resp 01/17/24 2051 16     Temp 01/17/24 2051 98 F (36.7 C)     Temp Source 01/17/24 2051 Oral     SpO2 01/17/24 2051 98 %     Weight --      Height 01/17/24 2049 5\' 6"  (1.676 m)     Head Circumference --      Peak Flow --      Pain Score 01/17/24 2049 0     Pain Loc --      Pain Education --      Exclude from Growth Chart --     Most recent vital signs: Vitals:   01/17/24 2051 01/17/24 2300  BP: (!) 131/99 119/81  Pulse: 99 63  Resp: 16 18  Temp: 98 F (36.7 C)   SpO2: 98% 100%     Constitutional: Alert nondistressed Eyes: Conjunctivae are normal.  Head: Atraumatic. Nose: No congestion/rhinnorhea. Mouth/Throat: Mucous membranes are moist.   Neck: Painless ROM.  Cardiovascular:   Good peripheral circulation.  Regular rhythm Respiratory: Normal respiratory effort.  No retractions.  No wheezing no rales Gastrointestinal: Skin intact, bowel sounds positive, Murphy sign negative, McBurney negative Rovsing negative rebound negative soft and nontender.  Musculoskeletal:  no deformity.  Posterior thorax: Tender to palpation at the level of T10.  Neurologic:  MAE  spontaneously. No gross focal neurologic deficits are appreciated.  Skin:  Skin is warm, dry and intact. No rash noted. Psychiatric: Mood and affect are normal. Speech and behavior are normal.    ED Results / Procedures / Treatments   Labs (all labs ordered are listed, but only abnormal results are displayed) Labs Reviewed  CBC WITH DIFFERENTIAL/PLATELET - Abnormal; Notable for the following components:      Result Value   Hemoglobin 11.0 (*)    HCT 35.0 (*)    MCH 25.7 (*)    All other components within normal limits  BASIC METABOLIC PANEL  LIPASE, BLOOD     EKG     RADIOLOGY I independently reviewed and interpreted imaging and agree with radiologists findings.      PROCEDURES:  Critical Care performed:   Procedures   MEDICATIONS ORDERED IN ED: Medications - No data to display Clinical Course as of 01/17/24 2336  Sun Jan 17, 2024  2307 CBC with Differential(!) [AE]  2307 Anemia, hemoglobin 11.0 [AE]  2311 Basic metabolic panel Electrolytes, kidney function within normal limits [AE]    Clinical Course User Index [AE] Gladys Damme, PA-C  IMPRESSION / MDM / ASSESSMENT AND PLAN / ED COURSE  I reviewed the triage vital signs and the nursing notes.  Differential diagnosis includes, but is not limited to, pancreatitis, cholecystitis, GERD, MI, anemia, B12 deficiency  Patient's presentation is most consistent with acute complicated illness / injury requiring diagnostic workup.   Patient's diagnosis is consistent with anemia, cervical strain.  Labs are  reassuring. I did review the patient's allergies and medications.The patient is in stable and satisfactory condition for discharge home  Patient will be discharged home with prescriptions for iron, diet. Patient is to follow up with neurology to follow-up numbness and tingling as needed or otherwise directed. Patient is given ED precautions to return to the ED for any worsening or new symptoms. Discussed plan  of care with patient, answered all of patient's questions, Patient agreeable to plan of care. Advised patient to take medications according to the instructions on the label. Discussed possible side effects of new medications. Patient verbalized understanding. Clinical Course as of 01/17/24 2336  Wynelle Link Jan 17, 2024  2307 CBC with Differential(!) [AE]  2307 Anemia, hemoglobin 11.0 [AE]  2311 Basic metabolic panel Electrolytes, kidney function within normal limits [AE]    Clinical Course User Index [AE] Gladys Damme, PA-C     FINAL CLINICAL IMPRESSION(S) / ED DIAGNOSES   Final diagnoses:  Other iron deficiency anemia  Strain of neck muscle, initial encounter     Rx / DC Orders   ED Discharge Orders     None        Note:  This document was prepared using Dragon voice recognition software and may include unintentional dictation errors.   Gladys Damme, PA-C 01/17/24 2336    Jene Every, MD 01/20/24 [phone]

## 2024-01-17 NOTE — Discharge Instructions (Addendum)
 Diagnosed with anemia, cervical strain.  Please take 1 tablet of sulfate ferrous after lunch with vitamin C.  Please call to make an appointment with neurology for a follow-up.  If you have new symptoms or symptoms worsen please come back to ED or go to your PCP.  Can make an appointment with your PCP for a follow-up of your anemia.  Continue taking B12.

## 2024-01-21 ENCOUNTER — Emergency Department: Payer: Self-pay

## 2024-01-21 ENCOUNTER — Other Ambulatory Visit: Payer: Self-pay

## 2024-01-21 DIAGNOSIS — R911 Solitary pulmonary nodule: Secondary | ICD-10-CM | POA: Insufficient documentation

## 2024-01-21 DIAGNOSIS — I10 Essential (primary) hypertension: Secondary | ICD-10-CM | POA: Diagnosis not present

## 2024-01-21 DIAGNOSIS — R079 Chest pain, unspecified: Secondary | ICD-10-CM | POA: Diagnosis present

## 2024-01-21 LAB — COMPREHENSIVE METABOLIC PANEL
ALT: 13 U/L (ref 0–44)
AST: 13 U/L — ABNORMAL LOW (ref 15–41)
Albumin: 3.8 g/dL (ref 3.5–5.0)
Alkaline Phosphatase: 62 U/L (ref 38–126)
Anion gap: 8 (ref 5–15)
BUN: 21 mg/dL — ABNORMAL HIGH (ref 6–20)
CO2: 25 mmol/L (ref 22–32)
Calcium: 9.2 mg/dL (ref 8.9–10.3)
Chloride: 105 mmol/L (ref 98–111)
Creatinine, Ser: 0.76 mg/dL (ref 0.44–1.00)
GFR, Estimated: >60 mL/min (ref >60)
Glucose, Bld: 99 mg/dL (ref 70–99)
Potassium: 3.9 mmol/L (ref 3.5–5.1)
Sodium: 138 mmol/L (ref 135–145)
Total Bilirubin: 0.4 mg/dL (ref 0.0–1.2)
Total Protein: 7.4 g/dL (ref 6.5–8.1)

## 2024-01-21 LAB — CBC
HCT: 34.8 % — ABNORMAL LOW (ref 36.0–46.0)
Hemoglobin: 11.1 g/dL — ABNORMAL LOW (ref 12.0–15.0)
MCH: 26.3 pg (ref 26.0–34.0)
MCHC: 31.9 g/dL (ref 30.0–36.0)
MCV: 82.5 fL (ref 80.0–100.0)
Platelets: 176 10*3/uL (ref 150–400)
RBC: 4.22 MIL/uL (ref 3.87–5.11)
RDW: 14.4 % (ref 11.5–15.5)
WBC: 5.7 10*3/uL (ref 4.0–10.5)
nRBC: 0 % (ref 0.0–0.2)

## 2024-01-21 LAB — LIPASE, BLOOD: Lipase: 33 U/L (ref 11–51)

## 2024-01-21 NOTE — ED Triage Notes (Signed)
 Pt c/o CP that started 30 mins PTA. Pt reports that she was laying down and sat up quickly and felt a sharp pain and burning in her upper stomach. Denies SOB.

## 2024-01-22 ENCOUNTER — Emergency Department
Admission: EM | Admit: 2024-01-22 | Discharge: 2024-01-22 | Disposition: A | Discharge status: Home / Self Care | Payer: Self-pay | Attending: Emergency Medicine | Admitting: Emergency Medicine

## 2024-01-22 ENCOUNTER — Emergency Department: Payer: Self-pay

## 2024-01-22 DIAGNOSIS — R079 Chest pain, unspecified: Secondary | ICD-10-CM

## 2024-01-22 DIAGNOSIS — R911 Solitary pulmonary nodule: Secondary | ICD-10-CM

## 2024-01-22 LAB — TROPONIN I (HIGH SENSITIVITY)
Troponin I (High Sensitivity): 2 ng/L (ref <18)
Troponin I (High Sensitivity): 2 ng/L (ref <18)

## 2024-01-22 LAB — D-DIMER, QUANTITATIVE: D-Dimer, Quant: 0.86 ug{FEU}/mL — ABNORMAL HIGH (ref 0.00–0.50)

## 2024-01-22 MED ORDER — KETOROLAC TROMETHAMINE 30 MG/ML IJ SOLN
15.0000 mg | Freq: Once | INTRAMUSCULAR | Status: AC
  Administered 2024-01-22: 15 mg via INTRAVENOUS
  Filled 2024-01-22: qty 1

## 2024-01-22 MED ORDER — FAMOTIDINE IN NACL 20-0.9 MG/50ML-% IV SOLN
20.0000 mg | Freq: Once | INTRAVENOUS | Status: AC
  Administered 2024-01-22: 20 mg via INTRAVENOUS
  Filled 2024-01-22: qty 50

## 2024-01-22 MED ORDER — IOHEXOL 350 MG/ML SOLN
100.0000 mL | Freq: Once | INTRAVENOUS | Status: AC | PRN
  Administered 2024-01-22: 100 mL via INTRAVENOUS

## 2024-01-22 NOTE — Discharge Instructions (Addendum)
 You will be called by the pulmonary nodule clinic to schedule a follow-up appointment.  Return to the ER for worsening symptoms, persistent vomiting, difficulty breathing or other concerns.

## 2024-01-22 NOTE — ED Provider Notes (Signed)
 [facility] Provider Note    Event Date/Time   First MD Initiated Contact with Patient 01/22/24 0106     (approximate)   History   Chest Pain   HPI  Diamond Cook is a 38 y.o. female who presents to the ED from home with a chief complaint of chest pain.  Patient was getting ready for bed approximately 30 minutes prior to arrival and felt a sharp pain starting in the left side of her chest radiating towards her right side and associated burning in her upper stomach.  Denies associated diaphoresis, shortness of breath, palpitations, nausea/vomiting or dizziness.  Pain has not recurred since she felt it at home.  Denies recent fever/chills, cough, abdominal pain, dysuria or diarrhea.  Has an IUD in place.  Denies recent travel or trauma.     Past Medical History   Past Medical History:  Diagnosis Date   Anemia    Anxiety    no meds   Depression    no meds   History of blood transfusion 2013   In [place]   Hypertension    Obesity    Vertigo      Active Problem List   Patient Active Problem List   Diagnosis Date Noted   BV (bacterial vaginosis) 09/10/2021   Menorrhagia with irregular cycle 12/17/2017   Morbid obesity (HCC) 12/17/2017   Iron deficiency anemia 12/17/2017   Benign essential hypertension 12/17/2017     Past Surgical History   Past Surgical History:  Procedure Laterality Date   CESAREAN SECTION  2004,2005,2012   x 3 in [facility]   DILATION AND CURETTAGE OF UTERUS N/A 02/24/2018   Procedure: DILATATION AND CURETTAGE;  Surgeon: Reva Bores, MD;  Location: [facility];  Service: Gynecology;  Laterality: N/A;   INTRAUTERINE DEVICE (IUD) INSERTION N/A 02/24/2018   Procedure: INTRAUTERINE DEVICE (IUD) INSERTION;  Surgeon: Reva Bores, MD;  Location: [facility];  Service: Gynecology;  Laterality: N/A;   NOVASURE ABLATION     In [place]   TUBAL LIGATION     in [facility]     Home Medications   Prior to Admission medications    Medication Sig Start Date End Date Taking? Authorizing Provider  cetirizine (ZYRTEC ALLERGY) 10 MG tablet Take 1 tablet (10 mg total) by mouth daily for 7 days. 10/26/22 11/02/22  Poggi, Herb Grays, PA-C  Ferrous Sulfate (IRON PO) Take 1 tablet every day by oral route.    [provider]  fluticasone (FLONASE) 50 MCG/ACT nasal spray Place 2 sprays into both nostrils daily. 08/02/23 08/01/24  Tommi Rumps, PA-C  hydrocortisone 2.5 % cream Apply topically. 02/07/22   [provider]  ibuprofen (ADVIL) 800 MG tablet Take 800 mg by mouth 3 (three) times daily.    [provider]  ketorolac (TORADOL) 10 MG tablet Take 1 tablet (10 mg total) by mouth every 6 (six) hours. 12/05/23   Tommi Rumps, PA-C  methocarbamol (ROBAXIN) 500 MG tablet Take 1 tablet (500 mg total) by mouth every 6 (six) hours as needed. 12/05/23   Tommi Rumps, PA-C  metoprolol succinate (TOPROL-XL) 25 MG 24 hr tablet Take 25 mg by mouth daily.    [provider]  WEGOVY 1.7 MG/0.75ML SOAJ Inject into the skin. 09/02/23   [provider]  ferrous sulfate 325 (65 FE) MG tablet Take 1 tablet (325 mg total) by mouth daily. Patient not taking: Reported on 10/23/2019 05/05/18 12/12/19  Mortis,  Sharyon Medicus, PA-C  hydrochlorothiazide (HYDRODIURIL) 25 MG tablet Take 1 tablet (25 mg total) by mouth daily. Patient not taking: Reported on 10/23/2019 05/18/19 12/12/19  Elson Areas, PA-C     Allergies  Patient has no known allergies.   Family History   Family History  Problem Relation Age of Onset   Diabetes Maternal Grandmother    Hypertension Maternal Grandmother    Diabetes Maternal Aunt    Hypertension Maternal Aunt    Diabetes Maternal Aunt    Hypertension Maternal Aunt    Healthy Mother    Asthma Father      Physical Exam  Triage Vital Signs: ED Triage Vitals  Encounter Vitals Group     BP 01/21/24 2317 (!) 140/106     Systolic BP Percentile --      Diastolic BP  Percentile --      Pulse Rate 01/21/24 2317 74     Resp 01/21/24 2317 18     Temp 01/21/24 2317 98.2 F (36.8 C)     Temp Source 01/21/24 2317 Oral     SpO2 01/21/24 2315 100 %     Weight 01/21/24 2315 285 lb (129.3 kg)     Height 01/21/24 2315 5\' 6"  (1.676 m)     Head Circumference --      Peak Flow --      Pain Score 01/21/24 2315 0     Pain Loc --      Pain Education --      Exclude from Growth Chart --     Updated Vital Signs: BP 121/86 (BP Location: Left Wrist)   Pulse 67   Temp 97.9 F (36.6 C) (Oral)   Resp 13   Ht 5\' 6"  (1.676 m)   Wt 129.3 kg   LMP 12/23/2023 (Exact Date)   SpO2 100%   BMI 46.00 kg/m    General: Awake, no distress.  CV:  RRR.  Good peripheral perfusion.  Resp:  Normal effort.  CTAB. Abd:  Nontender.  No distention.  Other:  Bilateral calves are supple and nontender.   ED Results / Procedures / Treatments  Labs (all labs ordered are listed, but only abnormal results are displayed) Labs Reviewed  CBC - Abnormal; Notable for the following components:      Result Value   Hemoglobin 11.1 (*)    HCT 34.8 (*)    All other components within normal limits  COMPREHENSIVE METABOLIC PANEL - Abnormal; Notable for the following components:   BUN 21 (*)    AST 13 (*)    All other components within normal limits  D-DIMER, QUANTITATIVE - Abnormal; Notable for the following components:   D-Dimer, Quant 0.86 (*)    All other components within normal limits  LIPASE, BLOOD  TROPONIN I (HIGH SENSITIVITY)  TROPONIN I (HIGH SENSITIVITY)     EKG  ED ECG REPORT I, Jhonnie Aliano J, the attending physician, personally viewed and interpreted this ECG.   Date: 01/22/2024  EKG Time: 2323  Rate: 72  Rhythm: normal sinus rhythm  Axis: Normal  Intervals:none  ST&T Change: Nonspecific    RADIOLOGY I have independently visualized and interpreted patient's imaging study as well as noted the radiology interpretation:  CTA chest: No PE, incidental  pulmonary nodule  Official radiology report(s): CT Angio Chest PE W/Cm &/Or Wo Cm Result Date: 01/22/2024 CLINICAL DATA:  Chest pain EXAM: CT ANGIOGRAPHY CHEST WITH CONTRAST TECHNIQUE: Multidetector CT imaging of the chest was performed using the standard protocol  during bolus administration of intravenous contrast. Multiplanar CT image reconstructions and MIPs were obtained to evaluate the vascular anatomy. RADIATION DOSE REDUCTION: This exam was performed according to the departmental dose-optimization program which includes automated exposure control, adjustment of the mA and/or kV according to patient size and/or use of iterative reconstruction technique. CONTRAST:  OMNIPAQUE IOHEXOL 350 MG/ML SOLN COMPARISON:  07/15/2020 FINDINGS: Cardiovascular: The heart size is normal. No substantial pericardial effusion. No thoracic aortic aneurysm. No substantial atherosclerosis of the thoracic aorta. There is no filling defect within the opacified pulmonary arteries to suggest the presence of an acute pulmonary embolus. Mediastinum/Nodes: No mediastinal lymphadenopathy. Soft tissue attenuation anterior mediastinum is similar to prior, compatible with thymic remnant. There is no hilar lymphadenopathy. The esophagus has normal imaging features. There is no axillary lymphadenopathy. Lungs/Pleura: 4 mm left upper lobe nodule identified on image 50/5, stable in the interval and consistent with benign etiology. No followup imaging is recommended. 4 mm left lower lobe nodule on 102/5 is new in the interval. No focal airspace consolidation. No pleural effusion. No features of pulmonary edema. Upper Abdomen: Visualized portion of the upper abdomen shows no acute findings. Musculoskeletal: No worrisome lytic or sclerotic osseous abnormality. Review of the MIP images confirms the above findings. IMPRESSION: No CT evidence for acute pulmonary embolus. 4 mm left lower lobe pulmonary nodule new since prior study. No follow-up  needed if patient is low-risk.This recommendation follows the consensus statement: Guidelines for Management of Incidental Pulmonary Nodules Detected on CT Images: From the Fleischner Society 2017; Radiology 2017; 284:228-243. Electronically Signed   By: Kennith Center M.D.   On: 01/22/2024 06:21   DG Chest 2 View Result Date: 01/22/2024 CLINICAL DATA:  Chest pain. EXAM: CHEST - 2 VIEW COMPARISON:  Apr 10, 2022 FINDINGS: The heart size and mediastinal contours are within normal limits. Both lungs are clear. The visualized skeletal structures are unremarkable. IMPRESSION: No active cardiopulmonary disease. Electronically Signed   By: Aram Candela M.D.   On: 01/22/2024 01:46     PROCEDURES:  Critical Care performed: No  .1-3 Lead EKG Interpretation  Performed by: Irean Hong, MD Authorized by: Irean Hong, MD     Interpretation: normal     ECG rate:  70   ECG rate assessment: normal     Rhythm: sinus rhythm     Ectopy: none     Conduction: normal   Comments:     Patient placed on cardiac monitor to evaluate for arrhythmias    MEDICATIONS ORDERED IN ED: Medications  famotidine (PEPCID) IVPB 20 mg premix (0 mg Intravenous Stopped 01/22/24 0239)  ketorolac (TORADOL) 30 MG/ML injection 15 mg (15 mg Intravenous Given 01/22/24 0204)  iohexol (OMNIPAQUE) 350 MG/ML injection 100 mL (100 mLs Intravenous Contrast Given 01/22/24 0312)     IMPRESSION / MDM / ASSESSMENT AND PLAN / ED COURSE  I reviewed the triage vital signs and the nursing notes.                             38 year old female presenting with chest pain. Differential diagnosis includes, but is not limited to, ACS, aortic dissection, pulmonary embolism, cardiac tamponade, pneumothorax, pneumonia, pericarditis, myocarditis, GI-related causes including esophagitis/gastritis, and musculoskeletal chest wall pain.   I personally reviewed patient's records and note GI office visit on 01/12/2024 for IBS and chronic abdominal  pain.  Patient's presentation is most consistent with acute complicated illness / injury requiring  diagnostic workup.  The patient is on the cardiac monitor to evaluate for evidence of arrhythmia and/or significant heart rate changes.  Laboratory results unremarkable including LFTs/lipase.  Initial troponin is negative.  Chest x-ray is clear.  Will check D-dimer, repeat troponin.  Administer IV Ketorolac and Famotidine.  Will reassess.  Clinical Course as of 01/22/24 0631  Fri Jan 22, 2024  0248 D-dimer positive with.  Will proceed with CTA chest to evaluate for PE.  Negative repeat troponin. [JS]  [id number] Delay due to radiology.  Awaiting results of CTA chest. [JS]  2841 CTA chest negative for PE.  Incidental pulmonary nodule noted.  Patient is a non-smoker with no family history of lung cancer.  Will refer her to pulmonary nodule clinic.  Strict return precautions given.  Patient verbalizes understanding and agrees with plan of care. [JS]    Clinical Course User Index [JS] Irean Hong, MD     FINAL CLINICAL IMPRESSION(S) / ED DIAGNOSES   Final diagnoses:  Nonspecific chest pain  Pulmonary nodule     Rx / DC Orders   ED Discharge Orders          Ordered    AMB  Referral to Pulmonary Nodule Clinic        01/22/24 0631             Note:  This document was prepared using Dragon voice recognition software and may include unintentional dictation errors.   Irean Hong, MD 01/22/24 [phone]

## 2024-02-04 ENCOUNTER — Encounter: Payer: Self-pay | Admitting: Pulmonary Disease

## 2024-02-04 ENCOUNTER — Ambulatory Visit: Admitting: Pulmonary Disease

## 2024-02-04 VITALS — BP 124/88 | HR 71 | Temp 97.6°F | Ht 66.0 in | Wt 288.8 lb

## 2024-02-04 DIAGNOSIS — R918 Other nonspecific abnormal finding of lung field: Secondary | ICD-10-CM | POA: Diagnosis not present

## 2024-02-04 DIAGNOSIS — R911 Solitary pulmonary nodule: Secondary | ICD-10-CM

## 2024-02-04 NOTE — Progress Notes (Signed)
 Synopsis: Referred in by Irean Hong, MD   Subjective:   PATIENT ID: Diamond Cook GENDER: female DOB: [birth year], MRN: [id number]  Chief Complaint  Patient presents with   Consult    No cough, shortness of breath or wheezing.     HPI Diamond Cook is a pleasant 38 years old female patient with a past medical history anxiety, morbid obesity today to the pulmonary clinic for evaluation of lung nodule.  She presented to the emergency department on 03/07 with nonspecific chest pain was found to have an elevated D-dimer.  Which prompted the CTA of the chest and was noted to have 4 mm left upper lobe nodule and a 4 mm left lower lobe nodule.  She denies any symptoms including shortness of breath, chest tightness, wheezing, dyspnea.  She denies carrying the diagnosis of asthma.  Family history -history of pulmonary diseases.  Social history -never smoker and denies any vaping or illicit drug use.  ROS All systems were reviewed and are negative except for the above.  Objective:   Vitals:   02/04/24 0843  BP: 124/88  Pulse: 71  Temp: 97.6 F (36.4 C)  TempSrc: Temporal  SpO2: 100%  Weight: 288 lb 12.8 oz (131 kg)  Height: 5\' 6"  (1.676 m)   100% on RA BMI Readings from Last 3 Encounters:  02/04/24 46.61 kg/m  01/21/24 46.00 kg/m  01/17/24 46.81 kg/m   Wt Readings from Last 3 Encounters:  02/04/24 288 lb 12.8 oz (131 kg)  01/21/24 285 lb (129.3 kg)  12/05/23 290 lb (131.5 kg)    Physical Exam GEN: NAD, Healthy Appearing HEENT: Supple Neck, Reactive Pupils, EOMI  CVS: Normal S1, Normal S2, RRR, No murmurs or ES appreciated  Lungs: Clear bilateral air entry.  Abdomen: Soft, non tender, non distended, + BS  Extremities: Warm and well perfused, No edema  Skin: No suspicious lesions appreciated  Psych: Normal Affect  Ancillary Information   CBC    Component Value Date/Time   WBC 5.7 01/21/2024 2324   RBC 4.22 01/21/2024 2324   HGB 11.1 (L) 01/21/2024 2324   HCT  34.8 (L) 01/21/2024 2324   PLT 176 01/21/2024 2324   MCV 82.5 01/21/2024 2324   MCH 26.3 01/21/2024 2324   MCHC 31.9 01/21/2024 2324   RDW 14.4 01/21/2024 2324   LYMPHSABS 2.1 01/17/2024 2237   MONOABS 0.2 01/17/2024 2237   EOSABS 0.1 01/17/2024 2237   BASOSABS 0.0 01/17/2024 2237   Labs and imaging were reviewed.      No data to display           Assessment & Plan:  Diamond Cook is a pleasant 38 years old female patient with a past medical history anxiety, morbid obesity today to the pulmonary clinic for evaluation of lung nodule.  #LUL Nodule measuring 4 mm  #LLL Nodule measuring 4 mm  She is low risk with lack of smoking and lack of family history of lung cancer. Mayo risk score 0.8%.   She does need follow up imaging. I advised her to follow up with me on an as needed basis and should she have any worsening respiratory symptoms.   Return if symptoms worsen or fail to improve.  I spent 45 minutes caring for this patient today, including preparing to see the patient, obtaining a medical history , reviewing a separately obtained history, performing a medically appropriate examination and/or evaluation, counseling and educating the patient/family/caregiver, documenting clinical information in the electronic health record,  and independently interpreting results (not separately reported/billed) and communicating results to the patient/family/caregiver  Janann Colonel, MD [facility] 02/04/2024 9:10 AM

## 2024-03-31 ENCOUNTER — Encounter: Payer: Self-pay | Admitting: Gastroenterology

## 2024-04-15 ENCOUNTER — Ambulatory Visit: Admit: 2024-04-15 | Payer: BC Managed Care – PPO | Admitting: Gastroenterology

## 2024-04-15 SURGERY — COLONOSCOPY WITH PROPOFOL
Anesthesia: General

## 2024-07-14 ENCOUNTER — Other Ambulatory Visit: Payer: Self-pay

## 2024-07-14 DIAGNOSIS — R102 Pelvic and perineal pain: Secondary | ICD-10-CM | POA: Insufficient documentation

## 2024-07-14 LAB — URINALYSIS, ROUTINE W REFLEX MICROSCOPIC
Bacteria, UA: NONE SEEN
Bilirubin Urine: NEGATIVE
Glucose, UA: NEGATIVE mg/dL
Ketones, ur: NEGATIVE mg/dL
Leukocytes,Ua: NEGATIVE
Nitrite: NEGATIVE
Protein, ur: NEGATIVE mg/dL
RBC / HPF: >50 RBC/hpf (ref 0–5)
Specific Gravity, Urine: 1.026 (ref 1.005–1.030)
pH: 6 (ref 5.0–8.0)

## 2024-07-14 LAB — CBC WITH DIFFERENTIAL/PLATELET
Abs Immature Granulocytes: 0.02 K/uL (ref 0.00–0.07)
Basophils Absolute: 0 K/uL (ref 0.0–0.1)
Basophils Relative: 0 %
Eosinophils Absolute: 0.1 K/uL (ref 0.0–0.5)
Eosinophils Relative: 1 %
HCT: 33.9 % — ABNORMAL LOW (ref 36.0–46.0)
Hemoglobin: 10.8 g/dL — ABNORMAL LOW (ref 12.0–15.0)
Immature Granulocytes: 0 %
Lymphocytes Relative: 39 %
Lymphs Abs: 2.4 K/uL (ref 0.7–4.0)
MCH: 26.1 pg (ref 26.0–34.0)
MCHC: 31.9 g/dL (ref 30.0–36.0)
MCV: 81.9 fL (ref 80.0–100.0)
Monocytes Absolute: 0.3 K/uL (ref 0.1–1.0)
Monocytes Relative: 5 %
Neutro Abs: 3.3 K/uL (ref 1.7–7.7)
Neutrophils Relative %: 55 %
Platelets: 164 K/uL (ref 150–400)
RBC: 4.14 MIL/uL (ref 3.87–5.11)
RDW: 14.8 % (ref 11.5–15.5)
WBC: 6.1 K/uL (ref 4.0–10.5)
nRBC: 0 % (ref 0.0–0.2)

## 2024-07-14 LAB — BASIC METABOLIC PANEL WITH GFR
Anion gap: 10 (ref 5–15)
BUN: 15 mg/dL (ref 6–20)
CO2: 22 mmol/L (ref 22–32)
Calcium: 8.6 mg/dL — ABNORMAL LOW (ref 8.9–10.3)
Chloride: 106 mmol/L (ref 98–111)
Creatinine, Ser: 0.79 mg/dL (ref 0.44–1.00)
GFR, Estimated: >60 mL/min (ref >60)
Glucose, Bld: 88 mg/dL (ref 70–99)
Potassium: 3.8 mmol/L (ref 3.5–5.1)
Sodium: 138 mmol/L (ref 135–145)

## 2024-07-14 LAB — POC URINE PREG, ED: Preg Test, Ur: NEGATIVE

## 2024-07-14 NOTE — ED Triage Notes (Signed)
 Pt reports pelvic cramping that began last night but is worse today. Pt reports she is currently on her menstrual cycle.

## 2024-07-15 ENCOUNTER — Emergency Department
Admission: EM | Admit: 2024-07-15 | Discharge: 2024-07-15 | Disposition: A | Discharge status: Home / Self Care | Attending: Emergency Medicine | Admitting: Emergency Medicine

## 2024-07-15 DIAGNOSIS — R102 Pelvic and perineal pain: Secondary | ICD-10-CM

## 2024-07-15 MED ORDER — OXYCODONE-ACETAMINOPHEN 5-325 MG PO TABS
2.0000 | ORAL_TABLET | Freq: Once | ORAL | Status: AC
  Administered 2024-07-15: 2 via ORAL
  Filled 2024-07-15: qty 2

## 2024-07-15 NOTE — ED Notes (Signed)
 BP would not read after 2 attempts, pt refused any more attempts.

## 2024-07-15 NOTE — Discharge Instructions (Signed)
 Your workup in the Emergency Department today was reassuring.  We did not find any specific abnormalities.  We recommend you drink plenty of fluids, take your regular medications and/or any new ones prescribed today, and follow up with the doctor(s) listed in these documents as recommended.  Return to the Emergency Department if you develop new or worsening symptoms that concern you.

## 2024-07-15 NOTE — ED Provider Notes (Signed)
 [facility] Provider Note    Event Date/Time   First MD Initiated Contact with Patient 07/15/24 0120     (approximate)   History   Pelvic Pain   HPI Diamond Cook is a 38 y.o. female who presents for abdominal pain for the last few days.  She started her menstrual cycle but said this hurts more than usual.  It comes and goes and is all along the bottom of her abdomen around my uterus and ovaries.  Does not localize to 1 particular side.  No nausea or vomiting.  No fever.  No chest pain or shortness of breath.  She said that she was checking the Internet and that she might have an ectopic pregnancy.     Physical Exam   Triage Vital Signs: ED Triage Vitals  Encounter Vitals Group     BP 07/14/24 2259 128/79     Girls Systolic BP Percentile --      Girls Diastolic BP Percentile --      Boys Systolic BP Percentile --      Boys Diastolic BP Percentile --      Pulse Rate 07/14/24 2259 (!) 101     Resp 07/14/24 2259 18     Temp 07/14/24 2259 100 F (37.8 C)     Temp Source 07/15/24 0120 Oral     SpO2 07/14/24 2259 98 %     Weight 07/14/24 2258 124.7 kg (275 lb)     Height 07/14/24 2258 1.702 m (5' 7)     Head Circumference --      Peak Flow --      Pain Score 07/14/24 2258 10     Pain Loc --      Pain Education --      Exclude from Growth Chart --     Most recent vital signs: Vitals:   07/15/24 0200 07/15/24 0330  BP: 113/85   Pulse: 78 77  Resp:    Temp:    SpO2: 100% 100%    General: Awake, no distress.  Patient was resting comfortably when I checked on her. CV:  Good peripheral perfusion.  Regular rate and rhythm. Resp:  Normal effort. Speaking easily and comfortably, no accessory muscle usage nor intercostal retractions.   Abd:  Obese.  Abdomen is soft and nondistended.  No tenderness to palpation throughout the abdomen with no guarding or rebound.   ED Results / Procedures / Treatments   Labs (all labs ordered are listed, but  only abnormal results are displayed) Labs Reviewed  CBC WITH DIFFERENTIAL/PLATELET - Abnormal; Notable for the following components:      Result Value   Hemoglobin 10.8 (*)    HCT 33.9 (*)    All other components within normal limits  BASIC METABOLIC PANEL WITH GFR - Abnormal; Notable for the following components:   Calcium 8.6 (*)    All other components within normal limits  URINALYSIS, ROUTINE W REFLEX MICROSCOPIC - Abnormal; Notable for the following components:   Color, Urine YELLOW (*)    APPearance CLEAR (*)    Hgb urine dipstick LARGE (*)    All other components within normal limits  POC URINE PREG, ED     PROCEDURES:  Critical Care performed: No  Procedures    IMPRESSION / MDM / ASSESSMENT AND PLAN / ED COURSE  I reviewed the triage vital signs and the nursing notes.  Differential diagnosis includes, but is not limited to, menstrual cycle pain, ovarian cyst, ovarian torsion, UTI, pregnancy complication.  Patient's presentation is most consistent with acute presentation with potential threat to life or bodily function.  Labs/studies ordered: Urine pregnancy test, basic metabolic panel, urinalysis, CBC with differential  Interventions/Medications given:  Medications - No data to display  (Note:  hospital course my include additional interventions and/or labs/studies not listed above.)   Vitals are stable and within normal limits, patient has been stable for nearly 5 hours in the ED and is currently not having any pain.  She has no tenderness to palpation of the abdomen at all.  Labs are all essentially normal.  She has hemoglobinuria but that is most likely due to her menstrual cycle.  Her pregnancy test is negative.  Patient is comfortable with the plan for outpatient follow-up.  I do not feel that imaging would be beneficial at this time.  The patient's medical screening exam is reassuring with no indication of an emergent medical  condition requiring hospitalization or additional evaluation at this point.  The patient is safe and appropriate for discharge and outpatient follow up.      FINAL CLINICAL IMPRESSION(S) / ED DIAGNOSES   Final diagnoses:  Pelvic pain in female     Rx / DC Orders   ED Discharge Orders     None        Note:  This document was prepared using Dragon voice recognition software and may include unintentional dictation errors.   Gordan Huxley, MD 07/15/24 [phone]

## 2024-07-19 NOTE — Progress Notes (Unsigned)
 Diamond Dwayne NOVAK, FNP   No chief complaint on file.   HPI:      Diamond Cook is a 38 y.o. G3P3003 whose LMP was No LMP recorded. (Menstrual status: IUD)., presents today for ED f/u pelvic pain. Went to ED 07/15/24 for pain with her menses, worse than usual. Sx improved, no GYN u/s done.     Patient Active Problem List   Diagnosis Date Noted   BV (bacterial vaginosis) 09/10/2021   Menorrhagia with irregular cycle 12/17/2017   Morbid obesity (HCC) 12/17/2017   Iron deficiency anemia 12/17/2017   Benign essential hypertension 12/17/2017    Past Surgical History:  Procedure Laterality Date   CESAREAN SECTION  [phone]   x 3 in [facility]   DILATION AND CURETTAGE OF UTERUS N/A 02/24/2018   Procedure: DILATATION AND CURETTAGE;  Surgeon: Fredirick Glenys RAMAN, MD;  Location: [facility];  Service: Gynecology;  Laterality: N/A;   INTRAUTERINE DEVICE (IUD) INSERTION N/A 02/24/2018   Procedure: INTRAUTERINE DEVICE (IUD) INSERTION;  Surgeon: Fredirick Glenys RAMAN, MD;  Location: [facility];  Service: Gynecology;  Laterality: N/A;   NOVASURE ABLATION     In [place]    TUBAL LIGATION     in [place]    Family History  Problem Relation Age of Onset   Diabetes Maternal Grandmother    Hypertension Maternal Grandmother    Diabetes Maternal Aunt    Hypertension Maternal Aunt    Diabetes Maternal Aunt    Hypertension Maternal Aunt    Healthy Mother    Asthma Father     Social History   Socioeconomic History   Marital status: Married    Spouse name: Not on file   Number of children: Not on file   Years of education: Not on file   Highest education level: Not on file  Occupational History   Not on file  Tobacco Use   Smoking status: Never   Smokeless tobacco: Never  Vaping Use   Vaping status: Never Used  Substance and Sexual Activity   Alcohol use: No   Drug use: No   Sexual activity: Yes    Birth control/protection: Surgical, I.U.D.    Comment: Tubal Ligation  Other Topics Concern    Not on file  Social History Narrative   Not on file   Social Drivers of Health   Financial Resource Strain: Not on file  Food Insecurity: Not on file  Transportation Needs: Not on file  Physical Activity: Not on file  Stress: Not on file  Social Connections: Not on file  Intimate Partner Violence: Not on file    Outpatient Medications Prior to Visit  Medication Sig Dispense Refill   cetirizine  (ZYRTEC  ALLERGY) 10 MG tablet Take 1 tablet (10 mg total) by mouth daily for 7 days. 7 tablet 0   Ferrous Sulfate  (IRON PO) Take 1 tablet every day by oral route.     fluticasone  (FLONASE ) 50 MCG/ACT nasal spray Place 2 sprays into both nostrils daily. 15.8 mL 0   hydrocortisone 2.5 % cream Apply topically.     ibuprofen  (ADVIL ) 800 MG tablet Take 800 mg by mouth 3 (three) times daily.     ketorolac  (TORADOL ) 10 MG tablet Take 1 tablet (10 mg total) by mouth every 6 (six) hours. 12 tablet 0   methocarbamol  (ROBAXIN ) 500 MG tablet Take 1 tablet (500 mg total) by mouth every 6 (six) hours as needed. 15 tablet 0   metoprolol succinate (TOPROL-XL) 25 MG 24  hr tablet Take 25 mg by mouth daily.     WEGOVY 1.7 MG/0.75ML SOAJ Inject into the skin.     No facility-administered medications prior to visit.      ROS:  Review of Systems BREAST: No symptoms   OBJECTIVE:   Vitals:  There were no vitals taken for this visit.  Physical Exam  Results: No results found for this or any previous visit (from the past 24 hours).   Assessment/Plan: No diagnosis found.    No orders of the defined types were placed in this encounter.     No follow-ups on file.  Aviv Lengacher B. Aniketh Huberty, PA-C 07/19/2024 3:17 PM

## 2024-07-20 ENCOUNTER — Ambulatory Visit: Admitting: Obstetrics and Gynecology

## 2024-07-20 ENCOUNTER — Encounter: Payer: Self-pay | Admitting: Obstetrics and Gynecology

## 2024-07-20 VITALS — BP 116/79 | HR 85 | Ht 67.0 in | Wt 273.0 lb

## 2024-07-20 DIAGNOSIS — Z30431 Encounter for routine checking of intrauterine contraceptive device: Secondary | ICD-10-CM

## 2024-07-20 DIAGNOSIS — D500 Iron deficiency anemia secondary to blood loss (chronic): Secondary | ICD-10-CM | POA: Diagnosis not present

## 2024-07-20 DIAGNOSIS — Z1329 Encounter for screening for other suspected endocrine disorder: Secondary | ICD-10-CM

## 2024-07-20 DIAGNOSIS — N939 Abnormal uterine and vaginal bleeding, unspecified: Secondary | ICD-10-CM

## 2024-07-20 DIAGNOSIS — R102 Pelvic and perineal pain: Secondary | ICD-10-CM | POA: Diagnosis not present

## 2024-07-20 NOTE — Patient Instructions (Signed)
 I value your feedback and you entrusting [place] with your care. If you get a [place] patient survey, I would appreciate you taking the time to let us know about your experience today. Thank you! ? ? ?

## 2024-07-21 ENCOUNTER — Ambulatory Visit: Payer: Self-pay | Admitting: Obstetrics and Gynecology

## 2024-07-21 LAB — TSH+FREE T4
Free T4: 1.18 ng/dL (ref 0.82–1.77)
TSH: 1.94 u[IU]/mL (ref 0.450–4.500)

## 2024-07-26 ENCOUNTER — Ambulatory Visit: Admission: RE | Admit: 2024-07-26 | Source: Ambulatory Visit

## 2024-08-02 ENCOUNTER — Ambulatory Visit: Attending: Obstetrics and Gynecology

## 2024-08-20 ENCOUNTER — Emergency Department
Admission: EM | Admit: 2024-08-20 | Discharge: 2024-08-20 | Disposition: A | Discharge status: Home / Self Care | Attending: Emergency Medicine | Admitting: Emergency Medicine

## 2024-08-20 ENCOUNTER — Emergency Department

## 2024-08-20 ENCOUNTER — Other Ambulatory Visit: Payer: Self-pay

## 2024-08-20 DIAGNOSIS — R202 Paresthesia of skin: Secondary | ICD-10-CM | POA: Insufficient documentation

## 2024-08-20 DIAGNOSIS — R0789 Other chest pain: Secondary | ICD-10-CM | POA: Diagnosis present

## 2024-08-20 LAB — BASIC METABOLIC PANEL WITH GFR
Anion gap: 8 (ref 5–15)
BUN: 20 mg/dL (ref 6–20)
CO2: 26 mmol/L (ref 22–32)
Calcium: 8.5 mg/dL — ABNORMAL LOW (ref 8.9–10.3)
Chloride: 106 mmol/L (ref 98–111)
Creatinine, Ser: 0.77 mg/dL (ref 0.44–1.00)
GFR, Estimated: >60 mL/min (ref >60)
Glucose, Bld: 78 mg/dL (ref 70–99)
Potassium: 3.9 mmol/L (ref 3.5–5.1)
Sodium: 140 mmol/L (ref 135–145)

## 2024-08-20 LAB — CBC
HCT: 32.2 % — ABNORMAL LOW (ref 36.0–46.0)
Hemoglobin: 10.2 g/dL — ABNORMAL LOW (ref 12.0–15.0)
MCH: 26.3 pg (ref 26.0–34.0)
MCHC: 31.7 g/dL (ref 30.0–36.0)
MCV: 83 fL (ref 80.0–100.0)
Platelets: 168 K/uL (ref 150–400)
RBC: 3.88 MIL/uL (ref 3.87–5.11)
RDW: 14.6 % (ref 11.5–15.5)
WBC: 5 K/uL (ref 4.0–10.5)
nRBC: 0 % (ref 0.0–0.2)

## 2024-08-20 LAB — TROPONIN I (HIGH SENSITIVITY): Troponin I (High Sensitivity): 3 ng/L (ref <18)

## 2024-08-20 LAB — POC URINE PREG, ED: Preg Test, Ur: NEGATIVE

## 2024-08-20 NOTE — ED Triage Notes (Signed)
 Pt to ed from home via POV for CP and numbness and tingling in her left arm and hand since 1130AM today. Pt is caox4, in no acute distress and ambulatory in triage,

## 2024-08-20 NOTE — ED Provider Notes (Signed)
 [facility] Provider Note    Event Date/Time   First MD Initiated Contact with Patient 08/20/24 2019     (approximate)   History   Chest Pain   HPI  Diamond Cook is a 38 y.o. female who presents to the ED for evaluation of Chest Pain   Patient presents for an episode of left-sided chest discomfort, tingling to her 4th and 5th fingers on the left arm.  Reports this occurred earlier today, she feels fine now just make sure she was not about to have a heart attack or stroke.   Physical Exam   Triage Vital Signs: ED Triage Vitals  Encounter Vitals Group     BP 08/20/24 1954 124/75     Girls Systolic BP Percentile --      Girls Diastolic BP Percentile --      Boys Systolic BP Percentile --      Boys Diastolic BP Percentile --      Pulse Rate 08/20/24 1954 78     Resp 08/20/24 1954 16     Temp 08/20/24 1954 98.8 F (37.1 C)     Temp Source 08/20/24 1954 Oral     SpO2 08/20/24 1954 98 %     Weight --      Height 08/20/24 1952 5' 7 (1.702 m)     Head Circumference --      Peak Flow --      Pain Score 08/20/24 1952 2     Pain Loc --      Pain Education --      Exclude from Growth Chart --     Most recent vital signs: Vitals:   08/20/24 1954  BP: 124/75  Pulse: 78  Resp: 16  Temp: 98.8 F (37.1 C)  SpO2: 98%    General: Awake, no distress.  CV:  Good peripheral perfusion.  RRR without appreciable murmur Resp:  Normal effort.  Abd:  No distention.  MSK:  No deformity noted.  Neuro:  No focal deficits appreciated. Cranial nerves II through XII intact 5/5 strength and sensation in all 4 extremities Other:     ED Results / Procedures / Treatments   Labs (all labs ordered are listed, but only abnormal results are displayed) Labs Reviewed  BASIC METABOLIC PANEL WITH GFR - Abnormal; Notable for the following components:      Result Value   Calcium 8.5 (*)    All other components within normal limits  CBC - Abnormal; Notable for  the following components:   Hemoglobin 10.2 (*)    HCT 32.2 (*)    All other components within normal limits  POC URINE PREG, ED  TROPONIN I (HIGH SENSITIVITY)  TROPONIN I (HIGH SENSITIVITY)    EKG Sinus rhythm with a rate of 75 bpm.  Normal axis and intervals without clear signs of acute ischemia  RADIOLOGY CXR interpreted by me without evidence of acute cardiopulmonary pathology.  Official radiology report(s): DG Chest 2 View Result Date: 08/20/2024 CLINICAL DATA:  Chest pain. EXAM: CHEST - 2 VIEW COMPARISON:  January 21, 2024 FINDINGS: The heart size and mediastinal contours are within normal limits. Both lungs are clear. The visualized skeletal structures are unremarkable. IMPRESSION: No active cardiopulmonary disease. Electronically Signed   By: Suzen Dials M.D.   On: 08/20/2024 20:28    PROCEDURES and INTERVENTIONS:  .1-3 Lead EKG Interpretation  Performed by: Claudene Rover, MD Authorized by: Claudene Rover, MD     Interpretation: normal  ECG rate:  74   ECG rate assessment: normal     Rhythm: sinus rhythm     Ectopy: none     Conduction: normal     Medications - No data to display   IMPRESSION / MDM / ASSESSMENT AND PLAN / ED COURSE  I reviewed the triage vital signs and the nursing notes.  Differential diagnosis includes, but is not limited to, ACS, PTX, PNA, muscle strain/spasm, PE, dissection, anxiety, pleural effusion  {Patient presents with symptoms of an acute illness or injury that is potentially life-threatening.  Fairly low risk patient presents after a resolved episode of atypical chest discomfort with a benign workup and suitable for outpatient management.  Nonischemic EKG, negative troponin.  Clear CXR.  Normal metabolic panel and CBC.  Asymptomatic and suitable for outpatient management      FINAL CLINICAL IMPRESSION(S) / ED DIAGNOSES   Final diagnoses:  Other chest pain     Rx / DC Orders   ED Discharge Orders     None         Note:  This document was prepared using Dragon voice recognition software and may include unintentional dictation errors.   Claudene Rover, MD 08/20/24 2329

## 2024-08-20 NOTE — ED Notes (Signed)
 Urine sent to lab

## 2024-08-20 NOTE — Discharge Instructions (Signed)
 Please take Tylenol  and ibuprofen /Advil  for your pain.  It is safe to take them together, or to alternate them every few hours.  Take up to 1000mg  of Tylenol  at a time, up to 4 times per day.  Do not take more than 4000 mg of Tylenol  in 24 hours.  For ibuprofen , take 400-600 mg, 3 - 4 times per day.  Return to the ED with any worsening symptoms despite these measures or other concerns

## 2024-08-24 ENCOUNTER — Ambulatory Visit
Admission: RE | Admit: 2024-08-24 | Discharge: 2024-08-24 | Disposition: A | Discharge status: Home / Self Care | Source: Ambulatory Visit | Attending: Obstetrics and Gynecology | Admitting: Obstetrics and Gynecology

## 2024-08-24 DIAGNOSIS — R102 Pelvic and perineal pain unspecified side: Secondary | ICD-10-CM | POA: Insufficient documentation

## 2024-08-24 DIAGNOSIS — N939 Abnormal uterine and vaginal bleeding, unspecified: Secondary | ICD-10-CM | POA: Insufficient documentation

## 2024-09-01 NOTE — Telephone Encounter (Signed)
 Pls schedule pt with Starla or Roby for hyst/D&C/IUD replacement consult. Thx.

## 2024-09-22 ENCOUNTER — Telehealth: Payer: Self-pay | Admitting: Obstetrics and Gynecology

## 2024-09-22 NOTE — Telephone Encounter (Signed)
 Good Morning, Patient called this morning to confirm her appointment.  Patient has an appointment on 10/11/2024 with Dr . Starla for hysterectomy. D&C/ IUD Consult. She stated that she is suppose to have a biopsy and has requested to be put to sleep for the procedures.  I explained to the patient that she would still have to come in an discuss her options and that is the only way the provider can get her on the schedule at the hospital.  Please advise.  Charlene

## 2024-09-22 NOTE — Telephone Encounter (Signed)
 Those are the correct answers. She can discuss all that with Starla at her consult.

## 2024-09-23 ENCOUNTER — Emergency Department

## 2024-09-23 ENCOUNTER — Emergency Department
Admission: EM | Admit: 2024-09-23 | Discharge: 2024-09-23 | Disposition: A | Discharge status: Home / Self Care | Attending: Emergency Medicine | Admitting: Emergency Medicine

## 2024-09-23 ENCOUNTER — Other Ambulatory Visit: Payer: Self-pay

## 2024-09-23 DIAGNOSIS — M436 Torticollis: Secondary | ICD-10-CM | POA: Insufficient documentation

## 2024-09-23 DIAGNOSIS — I10 Essential (primary) hypertension: Secondary | ICD-10-CM | POA: Diagnosis not present

## 2024-09-23 DIAGNOSIS — M542 Cervicalgia: Secondary | ICD-10-CM | POA: Diagnosis present

## 2024-09-23 MED ORDER — MELOXICAM 15 MG PO TABS: 15.0000 mg | ORAL_TABLET | Freq: Every day | ORAL | 0 refills | Status: AC

## 2024-09-23 MED ORDER — LIDOCAINE 5 % EX PTCH
1.0000 | MEDICATED_PATCH | CUTANEOUS | Status: DC
  Administered 2024-09-23: 1 via TRANSDERMAL
  Filled 2024-09-23: qty 1

## 2024-09-23 MED ORDER — CYCLOBENZAPRINE HCL 5 MG PO TABS: 5.0000 mg | ORAL_TABLET | Freq: Three times a day (TID) | ORAL | 0 refills | Status: AC | PRN

## 2024-09-23 MED ORDER — CYCLOBENZAPRINE HCL 10 MG PO TABS
5.0000 mg | ORAL_TABLET | Freq: Once | ORAL | Status: AC
  Administered 2024-09-23: 5 mg via ORAL
  Filled 2024-09-23: qty 1

## 2024-09-23 MED ORDER — KETOROLAC TROMETHAMINE 30 MG/ML IJ SOLN
30.0000 mg | Freq: Once | INTRAMUSCULAR | Status: AC
  Administered 2024-09-23: 30 mg via INTRAMUSCULAR
  Filled 2024-09-23: qty 1

## 2024-09-23 NOTE — ED Triage Notes (Signed)
 Pt to ED for mid neck pain since 2 days ago. Pain radiates to upper back. Has been taking Zanaflex and motrin . Denies injury. Full ROM.

## 2024-09-23 NOTE — Telephone Encounter (Signed)
 Patient aware. Restated appointment date and time at her request.

## 2024-09-23 NOTE — Discharge Instructions (Addendum)
 Your evaluated in the ED for posterior neck pain.  Your cervical spine x-ray is normal.  Please follow-up with your primary care provider for further evaluation.  Pain control:  Ibuprofen  (motrin /aleve /advil ) - You can take 3 tablets (600 mg) every 6 hours as needed for pain/fever.  Acetaminophen  (tylenol ) - You can take 2 extra strength tablets (1000 mg) every 6 hours as needed for pain/fever.  You can alternate these medications or take them together.  Make sure you eat food/drink water when taking these medications.

## 2024-09-23 NOTE — ED Provider Notes (Signed)
 [facility] Emergency Department Provider Note     Event Date/Time   First MD Initiated Contact with Patient 09/23/24 1847     (approximate)   History   Neck Pain   HPI  Diamond Cook is a 38 y.o. female with past medical history of HTN, anemia and anxiety presents to the ED for evaluation of posterior neck pain x 2 days.  Patient reports pain radiates up to the base of her skull.  Denies descending radiation.  She has tried tizanidine and Motrin  with no relief.  Denies injury.  Denies fever, nausea, vomiting, headache, and vision changes.     Physical Exam   Triage Vital Signs: ED Triage Vitals  Encounter Vitals Group     BP 09/23/24 1721 115/88     Girls Systolic BP Percentile --      Girls Diastolic BP Percentile --      Boys Systolic BP Percentile --      Boys Diastolic BP Percentile --      Pulse Rate 09/23/24 1721 82     Resp 09/23/24 1721 20     Temp 09/23/24 1721 98.3 F (36.8 C)     Temp Source 09/23/24 1721 Oral     SpO2 09/23/24 1721 100 %     Weight 09/23/24 1722 280 lb (127 kg)     Height 09/23/24 1722 5' 7 (1.702 m)     Head Circumference --      Peak Flow --      Pain Score 09/23/24 1720 8     Pain Loc --      Pain Education --      Exclude from Growth Chart --     Most recent vital signs: Vitals:   09/23/24 1859 09/23/24 2019  BP:    Pulse:    Resp:    Temp:    SpO2: 100% 100%   General: Well appearing and comfortable. Alert and oriented. INAD.   Head:  NCAT.  Eyes:  PERRLA. EOMI.  Neck:   Tenderness to midline cervical spine with palpation. Full ROM without difficulty.  CV:  Good peripheral perfusion.  RESP:  Normal effort.  NEURO: Cranial nerves  intact. No focal deficits.   ED Results / Procedures / Treatments   Labs (all labs ordered are listed, but only abnormal results are displayed) Labs Reviewed - No data to display  RADIOLOGY  I personally viewed and evaluated these images as part of my medical  decision making, as well as reviewing the written report by the radiologist.  ED Provider Interpretation: No acute bony abnormality  DG Cervical Spine Complete Result Date: 09/23/2024 EXAM: 6 OR MORE VIEW(S) XRAY OF THE CERVICAL SPINE 09/23/2024 07:29:48 PM COMPARISON: None available. CLINICAL HISTORY: Neck pain. FINDINGS: BONES: No acute fracture. No aggressive appearing osseous lesion. Alignment is normal. DISCS AND DEGENERATIVE CHANGES: No severe degenerative changes. SOFT TISSUES: No prevertebral soft tissue swelling. The visualized lungs appear clear. IMPRESSION: 1. No acute abnormality of the cervical spine. Electronically signed by: Norman Gatlin MD 09/23/2024 07:58 PM EST RP Workstation: [id number]   PROCEDURES:  Critical Care performed: No  Procedures  MEDICATIONS ORDERED IN ED: Medications  lidocaine  (LIDODERM ) 5 % 1 patch (1 patch Transdermal Patch Applied 09/23/24 1941)  ketorolac  (TORADOL ) 30 MG/ML injection 30 mg (30 mg Intramuscular Given 09/23/24 1940)  cyclobenzaprine (FLEXERIL) tablet 5 mg (5 mg Oral Given 09/23/24 1940)   IMPRESSION / MDM / ASSESSMENT AND PLAN / ED  COURSE  I reviewed the triage vital signs and the nursing notes.                               38 y.o. female presents to the emergency department for evaluation and treatment of neck pain. See HPI for further details.   Differential diagnosis includes, but is not limited to torticollis, spasm, fracture considered less likely   Patient's presentation is most consistent with acute complicated illness / injury requiring diagnostic workup.  Patient is alert and oriented.  She is hemodynamic stable.  Physical exam findings are as stated above and overall benign.  There was some tenderness to midline cervical spine which indicated cervical x-ray screening.  X-ray is reassuring.  Will send home patient with prescription for meloxicam  and Flexeril.  Advise a week trial for treatment effectiveness prior to following  up with primary care provider.  Patient is agreement with this care plan.  She is in stable condition for discharge home.  FINAL CLINICAL IMPRESSION(S) / ED DIAGNOSES   Final diagnoses:  Torticollis, acute   Rx / DC Orders   ED Discharge Orders     None      Note:  This document was prepared using Dragon voice recognition software and may include unintentional dictation errors.    Margrette, Kaylia Winborne A, PA-C 09/23/24 2029    Dicky Anes, MD 09/23/24 2048

## 2024-10-11 ENCOUNTER — Ambulatory Visit: Admitting: Obstetrics & Gynecology

## 2024-11-30 ENCOUNTER — Ambulatory Visit: Admitting: Obstetrics

## 2024-12-29 ENCOUNTER — Ambulatory Visit: Admitting: Obstetrics
